# Patient Record
Sex: Male | Born: 1950 | Race: White | Hispanic: No | Marital: Married | Smoking: Former smoker
Health system: Southern US, Community
[De-identification: ages and names within clinical notes are randomized; demographics above are authoritative.]

## PROBLEM LIST (undated history)

## (undated) DIAGNOSIS — E785 Hyperlipidemia, unspecified: Secondary | ICD-10-CM

## (undated) DIAGNOSIS — M109 Gout, unspecified: Secondary | ICD-10-CM

## (undated) DIAGNOSIS — H53139 Sudden visual loss, unspecified eye: Secondary | ICD-10-CM

## (undated) DIAGNOSIS — I719 Aortic aneurysm of unspecified site, without rupture: Secondary | ICD-10-CM

## (undated) DIAGNOSIS — M7710 Lateral epicondylitis, unspecified elbow: Secondary | ICD-10-CM

## (undated) DIAGNOSIS — E039 Hypothyroidism, unspecified: Secondary | ICD-10-CM

## (undated) DIAGNOSIS — Z8719 Personal history of other diseases of the digestive system: Secondary | ICD-10-CM

## (undated) DIAGNOSIS — G459 Transient cerebral ischemic attack, unspecified: Secondary | ICD-10-CM

## (undated) DIAGNOSIS — B351 Tinea unguium: Secondary | ICD-10-CM

## (undated) DIAGNOSIS — G43909 Migraine, unspecified, not intractable, without status migrainosus: Secondary | ICD-10-CM

## (undated) DIAGNOSIS — Z86010 Personal history of colonic polyps: Secondary | ICD-10-CM

## (undated) DIAGNOSIS — N200 Calculus of kidney: Secondary | ICD-10-CM

## (undated) DIAGNOSIS — M25569 Pain in unspecified knee: Secondary | ICD-10-CM

## (undated) DIAGNOSIS — G4700 Insomnia, unspecified: Secondary | ICD-10-CM

## (undated) DIAGNOSIS — K648 Other hemorrhoids: Secondary | ICD-10-CM

## (undated) DIAGNOSIS — Q231 Congenital insufficiency of aortic valve: Secondary | ICD-10-CM

## (undated) HISTORY — DX: Aortic aneurysm of unspecified site, without rupture: I71.9

## (undated) HISTORY — DX: Calculus of kidney: N20.0

## (undated) HISTORY — PX: OTHER SURGICAL HISTORY: SHX169

## (undated) HISTORY — DX: Gout, unspecified: M10.9

## (undated) HISTORY — DX: Personal history of colonic polyps: Z86.010

## (undated) HISTORY — DX: Sudden visual loss, unspecified eye: H53.139

## (undated) HISTORY — DX: Lateral epicondylitis, unspecified elbow: M77.10

## (undated) HISTORY — DX: Tinea unguium: B35.1

## (undated) HISTORY — DX: Hyperlipidemia, unspecified: E78.5

## (undated) HISTORY — DX: Congenital insufficiency of aortic valve: Q23.1

## (undated) HISTORY — DX: Pain in unspecified knee: M25.569

## (undated) HISTORY — DX: Insomnia, unspecified: G47.00

## (undated) HISTORY — DX: Other hemorrhoids: K64.8

## (undated) HISTORY — PX: MYRINGOTOMY WITH TUBE PLACEMENT: SHX5663

## (undated) HISTORY — PX: CARDIAC VALVE REPLACEMENT: SHX585

## (undated) HISTORY — PX: CYSTECTOMY: SUR359

## (undated) HISTORY — DX: Migraine, unspecified, not intractable, without status migrainosus: G43.909

## (undated) HISTORY — DX: Transient cerebral ischemic attack, unspecified: G45.9

## (undated) HISTORY — PX: TONSILLECTOMY: SHX5217

## (undated) HISTORY — DX: Hypothyroidism, unspecified: E03.9

## (undated) HISTORY — DX: Personal history of other diseases of the digestive system: Z87.19

---

## 2001-10-08 ENCOUNTER — Emergency Department (HOSPITAL_COMMUNITY): Admission: EM | Admit: 2001-10-08 | Discharge: 2001-10-08 | Payer: Self-pay | Admitting: Emergency Medicine

## 2003-07-10 ENCOUNTER — Encounter: Payer: Self-pay | Admitting: Internal Medicine

## 2005-07-10 ENCOUNTER — Ambulatory Visit: Payer: Self-pay | Admitting: Internal Medicine

## 2005-07-17 ENCOUNTER — Encounter: Payer: Self-pay | Admitting: Internal Medicine

## 2005-07-17 ENCOUNTER — Ambulatory Visit: Payer: Self-pay

## 2008-08-26 ENCOUNTER — Ambulatory Visit: Payer: Self-pay | Admitting: Family Medicine

## 2008-08-26 DIAGNOSIS — E039 Hypothyroidism, unspecified: Secondary | ICD-10-CM

## 2008-08-26 HISTORY — DX: Hypothyroidism, unspecified: E03.9

## 2008-10-20 ENCOUNTER — Ambulatory Visit: Payer: Self-pay | Admitting: Family Medicine

## 2008-10-20 LAB — CONVERTED CEMR LAB
Bilirubin Urine: NEGATIVE
Glucose, Urine, Semiquant: NEGATIVE
Ketones, urine, test strip: NEGATIVE
Protein, U semiquant: NEGATIVE
Urobilinogen, UA: 0.2
pH: 5

## 2008-10-21 LAB — CONVERTED CEMR LAB
BUN: 15 mg/dL (ref 6–23)
Basophils Absolute: 0 10*3/uL (ref 0.0–0.1)
Bilirubin, Direct: 0.1 mg/dL (ref 0.0–0.3)
Cholesterol: 190 mg/dL (ref 0–200)
Creatinine, Ser: 1 mg/dL (ref 0.4–1.5)
Eosinophils Relative: 3.2 % (ref 0.0–5.0)
GFR calc non Af Amer: 81.52 mL/min (ref >60)
Glucose, Bld: 113 mg/dL — ABNORMAL HIGH (ref 70–99)
HCT: 45.9 % (ref 39.0–52.0)
LDL Cholesterol: 143 mg/dL — ABNORMAL HIGH (ref 0–99)
Lymphs Abs: 1.2 10*3/uL (ref 0.7–4.0)
MCV: 93.6 fL (ref 78.0–100.0)
Monocytes Absolute: 0.4 10*3/uL (ref 0.1–1.0)
Monocytes Relative: 10.4 % (ref 3.0–12.0)
Neutrophils Relative %: 56.5 % (ref 43.0–77.0)
PSA: 0.88 ng/mL (ref 0.10–4.00)
Platelets: 195 10*3/uL (ref 150.0–400.0)
Potassium: 4.8 meq/L (ref 3.5–5.1)
RDW: 12 % (ref 11.5–14.6)
TSH: 9.24 microintl units/mL — ABNORMAL HIGH (ref 0.35–5.50)
Total Bilirubin: 0.8 mg/dL (ref 0.3–1.2)
Triglycerides: 76 mg/dL (ref 0.0–149.0)
VLDL: 15.2 mg/dL (ref 0.0–40.0)
WBC: 4.3 10*3/uL — ABNORMAL LOW (ref 4.5–10.5)

## 2008-11-02 ENCOUNTER — Ambulatory Visit: Payer: Self-pay | Admitting: Family Medicine

## 2008-11-02 DIAGNOSIS — M25569 Pain in unspecified knee: Secondary | ICD-10-CM | POA: Insufficient documentation

## 2008-11-02 DIAGNOSIS — B351 Tinea unguium: Secondary | ICD-10-CM

## 2008-11-02 HISTORY — DX: Pain in unspecified knee: M25.569

## 2008-11-02 HISTORY — DX: Tinea unguium: B35.1

## 2008-12-11 ENCOUNTER — Telehealth: Payer: Self-pay | Admitting: Family Medicine

## 2008-12-22 ENCOUNTER — Telehealth: Payer: Self-pay | Admitting: Family Medicine

## 2008-12-25 ENCOUNTER — Ambulatory Visit: Payer: Self-pay | Admitting: Family Medicine

## 2008-12-25 DIAGNOSIS — Q231 Congenital insufficiency of aortic valve: Secondary | ICD-10-CM | POA: Insufficient documentation

## 2008-12-25 DIAGNOSIS — H53139 Sudden visual loss, unspecified eye: Secondary | ICD-10-CM

## 2008-12-25 HISTORY — DX: Sudden visual loss, unspecified eye: H53.139

## 2008-12-25 HISTORY — DX: Congenital insufficiency of aortic valve: Q23.1

## 2009-01-06 ENCOUNTER — Ambulatory Visit: Payer: Self-pay

## 2009-01-06 ENCOUNTER — Encounter: Payer: Self-pay | Admitting: Family Medicine

## 2009-01-22 ENCOUNTER — Ambulatory Visit: Payer: Self-pay | Admitting: Internal Medicine

## 2009-01-25 ENCOUNTER — Encounter: Payer: Self-pay | Admitting: Internal Medicine

## 2009-01-25 ENCOUNTER — Telehealth: Payer: Self-pay | Admitting: Internal Medicine

## 2009-01-25 DIAGNOSIS — G459 Transient cerebral ischemic attack, unspecified: Secondary | ICD-10-CM

## 2009-01-25 HISTORY — DX: Transient cerebral ischemic attack, unspecified: G45.9

## 2009-01-26 ENCOUNTER — Ambulatory Visit (HOSPITAL_COMMUNITY): Admission: RE | Admit: 2009-01-26 | Discharge: 2009-01-26 | Payer: Self-pay | Admitting: Internal Medicine

## 2009-01-26 ENCOUNTER — Ambulatory Visit: Payer: Self-pay | Admitting: Internal Medicine

## 2009-01-27 LAB — CONVERTED CEMR LAB
BUN: 16 mg/dL (ref 6–23)
Basophils Absolute: 0 10*3/uL (ref 0.0–0.1)
Basophils Relative: 0.5 % (ref 0.0–3.0)
CO2: 32 meq/L (ref 19–32)
Chloride: 107 meq/L (ref 96–112)
Creatinine, Ser: 1.1 mg/dL (ref 0.4–1.5)
Eosinophils Relative: 1.6 % (ref 0.0–5.0)
Glucose, Bld: 135 mg/dL — ABNORMAL HIGH (ref 70–99)
HCT: 44.2 % (ref 39.0–52.0)
Hemoglobin: 15.3 g/dL (ref 13.0–17.0)
INR: 1.1 — ABNORMAL HIGH (ref 0.8–1.0)
Lymphocytes Relative: 24.2 % (ref 12.0–46.0)
Lymphs Abs: 1.1 10*3/uL (ref 0.7–4.0)
Monocytes Relative: 7.8 % (ref 3.0–12.0)
Neutro Abs: 3 10*3/uL (ref 1.4–7.7)
RBC: 4.74 M/uL (ref 4.22–5.81)
WBC: 4.6 10*3/uL (ref 4.5–10.5)

## 2009-01-28 ENCOUNTER — Telehealth (INDEPENDENT_AMBULATORY_CARE_PROVIDER_SITE_OTHER): Payer: Self-pay | Admitting: *Deleted

## 2009-01-28 ENCOUNTER — Inpatient Hospital Stay (HOSPITAL_BASED_OUTPATIENT_CLINIC_OR_DEPARTMENT_OTHER): Admission: RE | Admit: 2009-01-28 | Discharge: 2009-01-28 | Payer: Self-pay | Admitting: Cardiovascular Disease

## 2009-01-28 ENCOUNTER — Ambulatory Visit: Payer: Self-pay | Admitting: Cardiovascular Disease

## 2009-02-02 ENCOUNTER — Ambulatory Visit: Payer: Self-pay | Admitting: Surgery

## 2009-02-03 ENCOUNTER — Telehealth: Payer: Self-pay | Admitting: Internal Medicine

## 2009-02-03 ENCOUNTER — Encounter: Admission: RE | Admit: 2009-02-03 | Discharge: 2009-02-03 | Payer: Self-pay | Admitting: Surgery

## 2009-02-04 ENCOUNTER — Encounter: Admission: RE | Admit: 2009-02-04 | Discharge: 2009-02-04 | Payer: Self-pay | Admitting: Surgery

## 2009-02-09 ENCOUNTER — Ambulatory Visit: Payer: Self-pay | Admitting: Surgery

## 2009-02-17 ENCOUNTER — Telehealth: Payer: Self-pay | Admitting: Internal Medicine

## 2009-02-17 ENCOUNTER — Telehealth: Payer: Self-pay | Admitting: Family Medicine

## 2009-02-23 ENCOUNTER — Ambulatory Visit (HOSPITAL_COMMUNITY): Admission: RE | Admit: 2009-02-23 | Discharge: 2009-02-23 | Payer: Self-pay | Admitting: Surgery

## 2009-02-23 ENCOUNTER — Encounter: Payer: Self-pay | Admitting: Surgery

## 2009-02-25 ENCOUNTER — Encounter: Payer: Self-pay | Admitting: Surgery

## 2009-02-25 ENCOUNTER — Ambulatory Visit: Payer: Self-pay | Admitting: Surgery

## 2009-02-25 ENCOUNTER — Inpatient Hospital Stay (HOSPITAL_COMMUNITY): Admission: RE | Admit: 2009-02-25 | Discharge: 2009-03-03 | Payer: Self-pay | Admitting: Surgery

## 2009-03-05 ENCOUNTER — Ambulatory Visit: Payer: Self-pay | Admitting: Internal Medicine

## 2009-03-05 LAB — CONVERTED CEMR LAB: POC INR: 1.8

## 2009-03-10 ENCOUNTER — Ambulatory Visit: Payer: Self-pay | Admitting: Cardiovascular Disease

## 2009-03-10 LAB — CONVERTED CEMR LAB: POC INR: 1.5

## 2009-03-12 ENCOUNTER — Encounter: Payer: Self-pay | Admitting: Internal Medicine

## 2009-03-17 ENCOUNTER — Ambulatory Visit: Payer: Self-pay | Admitting: Internal Medicine

## 2009-03-23 ENCOUNTER — Ambulatory Visit: Payer: Self-pay | Admitting: Surgery

## 2009-03-23 ENCOUNTER — Encounter: Admission: RE | Admit: 2009-03-23 | Discharge: 2009-03-23 | Payer: Self-pay | Admitting: Surgery

## 2009-03-24 ENCOUNTER — Ambulatory Visit: Payer: Self-pay | Admitting: Internal Medicine

## 2009-03-24 LAB — CONVERTED CEMR LAB: POC INR: 2.5

## 2009-03-26 ENCOUNTER — Emergency Department (HOSPITAL_COMMUNITY): Admission: EM | Admit: 2009-03-26 | Discharge: 2009-03-26 | Payer: Self-pay | Admitting: Emergency Medicine

## 2009-03-26 ENCOUNTER — Encounter (INDEPENDENT_AMBULATORY_CARE_PROVIDER_SITE_OTHER): Payer: Self-pay | Admitting: *Deleted

## 2009-03-27 ENCOUNTER — Telehealth (INDEPENDENT_AMBULATORY_CARE_PROVIDER_SITE_OTHER): Payer: Self-pay | Admitting: *Deleted

## 2009-03-29 ENCOUNTER — Encounter: Payer: Self-pay | Admitting: Family Medicine

## 2009-03-29 ENCOUNTER — Telehealth: Payer: Self-pay | Admitting: Family Medicine

## 2009-03-31 ENCOUNTER — Telehealth: Payer: Self-pay | Admitting: Internal Medicine

## 2009-04-01 ENCOUNTER — Ambulatory Visit: Payer: Self-pay | Admitting: Internal Medicine

## 2009-04-01 DIAGNOSIS — E785 Hyperlipidemia, unspecified: Secondary | ICD-10-CM

## 2009-04-01 HISTORY — DX: Hyperlipidemia, unspecified: E78.5

## 2009-04-05 ENCOUNTER — Encounter: Payer: Self-pay | Admitting: Cardiovascular Disease

## 2009-04-07 ENCOUNTER — Telehealth: Payer: Self-pay | Admitting: Internal Medicine

## 2009-04-14 ENCOUNTER — Ambulatory Visit: Payer: Self-pay | Admitting: Cardiology

## 2009-04-27 ENCOUNTER — Ambulatory Visit: Payer: Self-pay | Admitting: Family Medicine

## 2009-04-27 DIAGNOSIS — G4700 Insomnia, unspecified: Secondary | ICD-10-CM | POA: Insufficient documentation

## 2009-04-27 DIAGNOSIS — H10029 Other mucopurulent conjunctivitis, unspecified eye: Secondary | ICD-10-CM | POA: Insufficient documentation

## 2009-04-27 DIAGNOSIS — H109 Unspecified conjunctivitis: Secondary | ICD-10-CM | POA: Insufficient documentation

## 2009-04-27 HISTORY — DX: Insomnia, unspecified: G47.00

## 2009-04-28 ENCOUNTER — Ambulatory Visit: Payer: Self-pay | Admitting: Cardiovascular Disease

## 2009-05-12 ENCOUNTER — Ambulatory Visit: Payer: Self-pay | Admitting: Cardiology

## 2009-05-12 LAB — CONVERTED CEMR LAB: POC INR: 2.1

## 2009-06-02 ENCOUNTER — Ambulatory Visit: Payer: Self-pay | Admitting: Family Medicine

## 2009-06-02 DIAGNOSIS — M7710 Lateral epicondylitis, unspecified elbow: Secondary | ICD-10-CM

## 2009-06-02 HISTORY — DX: Lateral epicondylitis, unspecified elbow: M77.10

## 2009-06-03 ENCOUNTER — Ambulatory Visit: Payer: Self-pay | Admitting: Cardiology

## 2009-06-21 ENCOUNTER — Ambulatory Visit: Payer: Self-pay | Admitting: Internal Medicine

## 2009-06-25 ENCOUNTER — Telehealth: Payer: Self-pay | Admitting: Internal Medicine

## 2009-07-02 ENCOUNTER — Encounter: Payer: Self-pay | Admitting: Internal Medicine

## 2009-07-02 ENCOUNTER — Ambulatory Visit: Payer: Self-pay

## 2009-07-02 ENCOUNTER — Ambulatory Visit: Payer: Self-pay | Admitting: Cardiovascular Disease

## 2009-07-02 ENCOUNTER — Ambulatory Visit (HOSPITAL_COMMUNITY): Admission: RE | Admit: 2009-07-02 | Discharge: 2009-07-02 | Payer: Self-pay | Admitting: Internal Medicine

## 2009-07-02 LAB — CONVERTED CEMR LAB: POC INR: 2

## 2009-07-29 ENCOUNTER — Ambulatory Visit: Payer: Self-pay | Admitting: Cardiovascular Disease

## 2009-07-29 LAB — CONVERTED CEMR LAB: POC INR: 1.9

## 2009-08-26 ENCOUNTER — Ambulatory Visit: Payer: Self-pay | Admitting: Cardiology

## 2009-08-26 LAB — CONVERTED CEMR LAB: POC INR: 2

## 2009-09-23 ENCOUNTER — Ambulatory Visit: Payer: Self-pay | Admitting: Cardiology

## 2009-10-14 ENCOUNTER — Ambulatory Visit: Payer: Self-pay | Admitting: Family Medicine

## 2009-10-21 ENCOUNTER — Ambulatory Visit: Payer: Self-pay | Admitting: Internal Medicine

## 2009-10-21 LAB — CONVERTED CEMR LAB: POC INR: 2.8

## 2009-11-02 ENCOUNTER — Encounter (INDEPENDENT_AMBULATORY_CARE_PROVIDER_SITE_OTHER): Payer: Self-pay | Admitting: *Deleted

## 2009-11-18 ENCOUNTER — Ambulatory Visit: Payer: Self-pay | Admitting: Cardiovascular Disease

## 2009-12-09 ENCOUNTER — Telehealth: Payer: Self-pay | Admitting: Internal Medicine

## 2009-12-16 ENCOUNTER — Ambulatory Visit: Payer: Self-pay | Admitting: Internal Medicine

## 2009-12-20 ENCOUNTER — Ambulatory Visit: Payer: Self-pay | Admitting: Internal Medicine

## 2009-12-20 DIAGNOSIS — K648 Other hemorrhoids: Secondary | ICD-10-CM | POA: Insufficient documentation

## 2009-12-20 DIAGNOSIS — Z860100 Personal history of colon polyps, unspecified: Secondary | ICD-10-CM

## 2009-12-20 HISTORY — DX: Personal history of colonic polyps: Z86.010

## 2009-12-20 HISTORY — DX: Other hemorrhoids: K64.8

## 2009-12-20 HISTORY — DX: Personal history of colon polyps, unspecified: Z86.0100

## 2010-01-13 ENCOUNTER — Ambulatory Visit: Payer: Self-pay | Admitting: Internal Medicine

## 2010-02-10 ENCOUNTER — Ambulatory Visit: Payer: Self-pay | Admitting: Cardiology

## 2010-02-10 LAB — CONVERTED CEMR LAB: POC INR: 1.6

## 2010-03-03 ENCOUNTER — Ambulatory Visit: Payer: Self-pay | Admitting: Internal Medicine

## 2010-03-03 LAB — CONVERTED CEMR LAB: POC INR: 2.7

## 2010-03-31 ENCOUNTER — Telehealth: Payer: Self-pay | Admitting: Family Medicine

## 2010-05-12 ENCOUNTER — Ambulatory Visit: Payer: Self-pay | Admitting: Cardiology

## 2010-06-23 ENCOUNTER — Ambulatory Visit: Admit: 2010-06-23 | Payer: Self-pay

## 2010-06-29 ENCOUNTER — Telehealth: Payer: Self-pay | Admitting: Family Medicine

## 2010-06-30 ENCOUNTER — Ambulatory Visit
Admission: RE | Admit: 2010-06-30 | Discharge: 2010-06-30 | Payer: Self-pay | Source: Home / Self Care | Attending: Family Medicine | Admitting: Family Medicine

## 2010-06-30 ENCOUNTER — Other Ambulatory Visit: Payer: Self-pay | Admitting: Family Medicine

## 2010-06-30 LAB — TSH: TSH: 10.06 u[IU]/mL — ABNORMAL HIGH (ref 0.35–5.50)

## 2010-07-04 ENCOUNTER — Encounter: Payer: Self-pay | Admitting: Internal Medicine

## 2010-07-04 ENCOUNTER — Telehealth: Payer: Self-pay | Admitting: Internal Medicine

## 2010-07-12 NOTE — Medication Information (Signed)
Summary: rov couamdin - [facility]  Anticoagulant Therapy  Managed by: Bethena Midget, RN, BSN Referring MD: Tenny Craw MD, Gunnar Fusi PCP: [name] MD Supervising MD: Eden Emms MD, Theron Arista Indication 1: Aortic Valve Replacement (V34.3) Indication 2: Aortic Valve Replacement Valve Type: [facility] Judes -- Mechanical Lab Used: [facility] Site: [address] INR POC 2.0 INR RANGE 2-3  Dietary changes: yes       Details: Diet is not regular, it dose vary.   Health status changes: no    Bleeding/hemorrhagic complications: no    Recent/future hospitalizations: no    Any changes in medication regimen? no    Recent/future dental: no  Any missed doses?: no       Is patient compliant with meds? yes       Allergies: 1)  ! Penicillin V Potassium (Penicillin V Potassium) 2)  ! * Shellfish  Anticoagulation Management History:      The patient is taking warfarin and comes in today for a routine follow up visit.  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  Negative risk factors for bleeding include an age less than 37 years old.  The bleeding index is 'intermediate risk'.  Positive CHADS2 values include Prior Stroke/CVA/TIA.  Negative CHADS2 values include Age > 45 years old.  His last INR was 2.1.  Anticoagulation responsible provider: Eden Emms MD, Theron Arista.  INR POC: 2.0.  Cuvette Lot#: [id number].  Exp: 09/2010.    Anticoagulation Management Assessment/Plan:      The patient's current anticoagulation dose is Coumadin 5 mg tabs: Take as directed by coumadin clinic..  The target INR is 2.0-3.0.  The next INR is due 07/29/2009.  Anticoagulation instructions were given to patient.  Results were reviewed/authorized by Bethena Midget, RN, BSN.  He was notified by Bethena Midget, RN, BSN.         Prior Anticoagulation Instructions: INR 2.1  ok to increase greens Coumadin 1 and 1/2 tab = 7.5mg  each day  Current Anticoagulation Instructions: INR 2.0 Today take 10mg s then resume 7.5mg s daily. Recheck in  4 weeks.

## 2010-07-12 NOTE — Medication Information (Signed)
Summary: rov/ewj  Anticoagulant Therapy  Managed by: Weston Brass, PharmD Referring MD: Tenny Craw MD, Gunnar Fusi PCP: [name] MD Supervising MD: Jens Som MD, Arlys John Indication 1: Aortic Valve Replacement (V34.3) Indication 2: Aortic Valve Replacement Valve Type: [facility] Judes -- Mechanical Lab Used: [facility] Site: [facility] INR POC 2.0 INR RANGE 2-3  Dietary changes: no    Health status changes: no    Bleeding/hemorrhagic complications: no    Recent/future hospitalizations: no    Any changes in medication regimen? no    Recent/future dental: no  Any missed doses?: no       Is patient compliant with meds? yes       Allergies: 1)  ! Penicillin V Potassium (Penicillin V Potassium) 2)  ! * Shellfish  Anticoagulation Management History:      The patient is taking warfarin and comes in today for a routine follow up visit.  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  Negative risk factors for bleeding include an age less than 30 years old.  The bleeding index is 'intermediate risk'.  Positive CHADS2 values include Prior Stroke/CVA/TIA.  Negative CHADS2 values include Age > 11 years old.  His last INR was 2.1.  Anticoagulation responsible provider: Jens Som MD, Arlys John.  INR POC: 2.0.  Cuvette Lot#: [id number].  Exp: 09/2010.    Anticoagulation Management Assessment/Plan:      The patient's current anticoagulation dose is Coumadin 5 mg tabs: Take as directed by coumadin clinic..  The target INR is 2.0-3.0.  The next INR is due 10/21/2009.  Anticoagulation instructions were given to patient.  Results were reviewed/authorized by Weston Brass, PharmD.  He was notified by Weston Brass PharmD.         Prior Anticoagulation Instructions: INR 2.0  Continue on same dosage 1.5 tablets daily except 2 tablets on Mondays and Thursdays.  Recheck in 4 weeks.    Current Anticoagulation Instructions: INR 2.0  Increase dose to 1 1/2 tablets every day except 2 tablets on Sunday,  Tuesday and Thursday

## 2010-07-12 NOTE — Medication Information (Signed)
Summary: rov/sp  Anticoagulant Therapy  Managed by: Cloyde Reams, RN, BSN Referring MD: Tenny Craw MD, Gunnar Fusi PCP: [name] MD Supervising MD: Tenny Craw MD, Gunnar Fusi Indication 1: Aortic Valve Replacement (V34.3) Indication 2: Aortic Valve Replacement Valve Type: [facility] -- Mechanical Lab Used: [facility] Site: [facility] INR POC 2.6 INR RANGE 2-3  Dietary changes: no    Health status changes: no    Bleeding/hemorrhagic complications: no    Recent/future hospitalizations: no    Any changes in medication regimen? yes       Details: Started on Doxycycline 1 week ago, has 1 more week on.   Recent/future dental: no  Any missed doses?: no       Is patient compliant with meds? yes       Allergies: 1)  ! Penicillin V Potassium (Penicillin V Potassium) 2)  ! * Shellfish  Anticoagulation Management History:      The patient is taking warfarin and comes in today for a routine follow up visit.  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  Negative risk factors for bleeding include an age less than 42 years old.  The bleeding index is 'intermediate risk'.  Positive CHADS2 values include Prior Stroke/CVA/TIA.  Negative CHADS2 values include Age > 68 years old.  His last INR was 2.1.  Anticoagulation responsible provider: Tenny Craw MD, Gunnar Fusi.  INR POC: 2.6.  Cuvette Lot#: [id number].  Exp: 02/2011.    Anticoagulation Management Assessment/Plan:      The patient's current anticoagulation dose is Coumadin 5 mg tabs: Take as directed by coumadin clinic..  The target INR is 2.0-3.0.  The next INR is due 01/13/2010.  Anticoagulation instructions were given to patient.  Results were reviewed/authorized by Cloyde Reams, RN, BSN.  He was notified by Cloyde Reams RN.         Prior Anticoagulation Instructions: INR 2.1  Continue same dose of 1 1/2 tablets every day except 2 tablets on Sunday, Tuesday and Thursday.   Current Anticoagulation Instructions: INR 2.6  Continue on same  dosage 1.5 tablets daily except 2 tablets on Sundays, Tuesdays, and Thursdays.  Recheck in 4 weeks.

## 2010-07-12 NOTE — Progress Notes (Signed)
Summary: Question about medication  Phone Note Call from Patient Call back at Home Phone [phone]   Caller: Patient Summary of Call: Pt calling with question about medication Initial call taken by: Judie Grieve,  December 09, 2009 2:02 PM  Follow-up for Phone Call        Attempted TCB pt.  LMOM TCB with questions. Cloyde Reams RN  December 09, 2009 3:49 PM  Pt returning call about medication question Judie Grieve  December 10, 2009 11:26 AM    Additional Follow-up for Phone Call Additional follow up Details #1::        Pt on doxycycline x 14 days for skin infection.  INR on 6/9 was 2.1.  Has appt next week.  Instructed pt to continue current dose and keep his appt next week.  Additional Follow-up by: Weston Brass PharmD,  December 10, 2009 2:02 PM

## 2010-07-12 NOTE — Medication Information (Signed)
Summary: rov/tm  Anticoagulant Therapy  Managed by: Cloyde Reams, RN, BSN Referring MD: Tenny Craw MD, Gunnar Fusi PCP: [name] MD Supervising MD: Eden Emms MD, Theron Arista Indication 1: Aortic Valve Replacement (V34.3) Indication 2: Aortic Valve Replacement Valve Type: [facility] -- Mechanical Lab Used: [facility] Site: [facility] INR POC 1.9 INR RANGE 2-3  Dietary changes: yes       Details: More consistant with vit K intake  Health status changes: no    Bleeding/hemorrhagic complications: no    Recent/future hospitalizations: no    Any changes in medication regimen? no    Recent/future dental: no  Any missed doses?: no       Is patient compliant with meds? yes       Allergies (verified): 1)  ! Penicillin V Potassium (Penicillin V Potassium) 2)  ! * Shellfish  Anticoagulation Management History:      The patient is taking warfarin and comes in today for a routine follow up visit.  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  Negative risk factors for bleeding include an age less than 37 years old.  The bleeding index is 'intermediate risk'.  Positive CHADS2 values include Prior Stroke/CVA/TIA.  Negative CHADS2 values include Age > 11 years old.  His last INR was 2.1.  Anticoagulation responsible provider: Eden Emms MD, Theron Arista.  INR POC: 1.9.  Cuvette Lot#: [id number].  Exp: 09/2010.    Anticoagulation Management Assessment/Plan:      The patient's current anticoagulation dose is Coumadin 5 mg tabs: Take as directed by coumadin clinic..  The target INR is 2.0-3.0.  The next INR is due 08/26/2009.  Anticoagulation instructions were given to patient.  Results were reviewed/authorized by Cloyde Reams, RN, BSN.  He was notified by Cloyde Reams RN.         Prior Anticoagulation Instructions: INR 2.0 Today take 10mg s then resume 7.5mg s daily. Recheck in 4 weeks.   Current Anticoagulation Instructions: INR 1.9  Start taking 7.5mg  daily except 10mg  on Thursdays.   Recheck in 4 weeks.   Prescriptions: COUMADIN 5 MG TABS (WARFARIN SODIUM) Take as directed by coumadin clinic.  #150 x 3   Entered by:   Cloyde Reams RN   Authorized by:   Sherrill Raring, MD, [facility]   Signed by:   Cloyde Reams RN on 07/29/2009   Method used:   Electronically to        [facility]. [address] [phone]* (retail)       [address]       [place], [place]  [address]       Ph: [phone] or [phone]       Fax: [phone]   RxID:   [id number]

## 2010-07-12 NOTE — Medication Information (Signed)
Summary: rov/sp  Anticoagulant Therapy  Managed by: Weston Brass, PharmD Referring MD: Tenny Craw MD, Gunnar Fusi PCP: [name] MD Supervising MD: Eden Emms MD, Theron Arista Indication 1: Aortic Valve Replacement (V34.3) Indication 2: Aortic Valve Replacement Valve Type: [facility] -- Mechanical Lab Used: [facility] Site: [facility] INR POC 2.1 INR RANGE 2-3  Dietary changes: no    Health status changes: no    Bleeding/hemorrhagic complications: no    Recent/future hospitalizations: no    Any changes in medication regimen? no    Recent/future dental: no  Any missed doses?: no       Is patient compliant with meds? yes       Allergies: 1)  ! Penicillin V Potassium (Penicillin V Potassium) 2)  ! * Shellfish  Anticoagulation Management History:      The patient is taking warfarin and comes in today for a routine follow up visit.  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  Negative risk factors for bleeding include an age less than 57 years old.  The bleeding index is 'intermediate risk'.  Positive CHADS2 values include Prior Stroke/CVA/TIA.  Negative CHADS2 values include Age > 60 years old.  His last INR was 2.1.  Anticoagulation responsible provider: Eden Emms MD, Theron Arista.  INR POC: 2.1.  Cuvette Lot#: [id number].  Exp: 01/2011.    Anticoagulation Management Assessment/Plan:      The patient's current anticoagulation dose is Coumadin 5 mg tabs: Take as directed by coumadin clinic..  The target INR is 2.0-3.0.  The next INR is due 12/16/2009.  Anticoagulation instructions were given to patient.  Results were reviewed/authorized by Weston Brass, PharmD.  He was notified by Weston Brass PharmD.         Prior Anticoagulation Instructions: INR 2.8  Continue same dose of 1 1/2 tablets every day except 2 tablets on Sunday, Tuesday and Thursday   Current Anticoagulation Instructions: INR 2.1  Continue same dose of 1 1/2 tablets every day except 2 tablets on Sunday, Tuesday and  Thursday.

## 2010-07-12 NOTE — Medication Information (Signed)
Summary: rov/ewj  Anticoagulant Therapy  Managed by: Cloyde Reams, RN, BSN Referring MD: Tenny Craw MD, Gunnar Fusi PCP: [name] MD Supervising MD: Tenny Craw MD, Gunnar Fusi Indication 1: Aortic Valve Replacement (V34.3) Indication 2: Aortic Valve Replacement Valve Type: [facility]es -- Mechanical Lab Used: [facility] Site: [facility] INR POC 2.7 INR RANGE 2-3  Dietary changes: no    Health status changes: no    Bleeding/hemorrhagic complications: no    Recent/future hospitalizations: no    Any changes in medication regimen? yes       Details: Completed 30 day course of Doxycycline.    Recent/future dental: no  Any missed doses?: no       Is patient compliant with meds? yes       Allergies: 1)  ! Penicillin V Potassium (Penicillin V Potassium) 2)  ! * Shellfish  Anticoagulation Management History:      The patient is taking warfarin and comes in today for a routine follow up visit.  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  Negative risk factors for bleeding include an age less than 79 years old.  The bleeding index is 'intermediate risk'.  Positive CHADS2 values include Prior Stroke/CVA/TIA.  Negative CHADS2 values include Age > 32 years old.  His last INR was 2.1.  Anticoagulation responsible provider: Tenny Craw MD, Gunnar Fusi.  INR POC: 2.7.  Cuvette Lot#: [id number].  Exp: 03/2011.    Anticoagulation Management Assessment/Plan:      The patient's current anticoagulation dose is Coumadin 5 mg tabs: Take as directed by coumadin clinic..  The target INR is 2.0-3.0.  The next INR is due 02/10/2010.  Anticoagulation instructions were given to patient.  Results were reviewed/authorized by Cloyde Reams, RN, BSN.  He was notified by Cloyde Reams RN.         Prior Anticoagulation Instructions: INR 2.6  Continue on same dosage 1.5 tablets daily except 2 tablets on Sundays, Tuesdays, and Thursdays.  Recheck in 4 weeks.   Current Anticoagulation Instructions: INR 2.7  Continue  on same dosage 1.5 tablets daily except 2 tablets on Sundays, Tuesdays, and Thursdays.  Recheck in 4 weeks.

## 2010-07-12 NOTE — Procedures (Signed)
Summary: colonoscopy   Colonoscopy  Procedure date:  07/10/2003  Findings:      Pathology:  Hyperplastic polyp.     Location:  [facility].    Procedures Next Due Date:    Colonoscopy: 07/2008  Colonoscopy  Procedure date:  07/10/2003  Findings:      Pathology:  Hyperplastic polyp.     Location:  [facility].    Procedures Next Due Date:    Colonoscopy: 07/2008 Patient Name: Samuel [name], Samuel [name]. MRN:  Procedure Procedures: Colonoscopy CPT: [phone].    with polypectomy. CPT: [id number].  Personnel: Endoscopist: Wilhemina Bonito. Marina Goodell, MD.  Referred By: [name], MD.  Exam Location: Exam performed in Outpatient Clinic. Outpatient  Patient Consent: Procedure, Alternatives, Risks and Benefits discussed, consent obtained, from patient. Consent was obtained by the RN.  Indications Symptoms: Hematochezia.  Average Risk Screening Routine.  History  Current Medications: Patient is not currently taking Coumadin.  Pre-Exam Physical: Performed Jul 10, 2003. Cardio-pulmonary exam abnormal. Rectal exam, HEENT exam , Abdominal exam, Extremity exam, Neurological exam, Mental status exam WNL. Abnormal PE findings include: MUR MUR.  Exam Exam: Extent of exam reached: Cecum, extent intended: Cecum.  The cecum was identified by appendiceal orifice and IC valve. Patient position: on left side. Colon retroflexion performed. Images taken. ASA Classification: II. Tolerance: excellent.  Monitoring: Pulse and BP monitoring, Oximetry used. Supplemental O2 given.  Colon Prep Used MIRALAX for colon prep. Prep results: excellent.  Sedation Meds: Patient assessed and found to be appropriate for moderate (conscious) sedation. Fentanyl 75 mcg. given IV. Versed 7 mg. given IV.  Findings NORMAL EXAM: Cecum to Rectum. Comments: INTERNAL HEMORRHOIDS PRESENT.  POLYP: Descending Colon, Maximum size: 4 mm. sessile polyp. Procedure:  snare without cautery, removed, retrieved,  Polyp sent to pathology. ICD9: Colon Polyps: 211.3.   Assessment Abnormal examination, see findings above.  Diagnoses: 211.3: Colon Polyps.  455.0: Hemorrhoids, Internal.   Events  Unplanned Interventions: No intervention was required.  Unplanned Events: There were no complications. Plans Patient Education: Patient given standard instructions for: Polyps.  Disposition: After procedure patient sent to recovery. After recovery patient sent home.  Scheduling/Referral: Colonoscopy, to Wilhemina Bonito. Marina Goodell, MD, IN 5 YEARS,    This report was created from the original endoscopy report, which was reviewed and signed by the above listed endoscopist.   cc:  [name], MD      The Patient

## 2010-07-12 NOTE — Medication Information (Signed)
Summary: rov/ewj  Anticoagulant Therapy  Managed by: Cloyde Reams, RN, BSN Referring MD: Tenny Craw MD, Gunnar Fusi PCP: [name] MD Supervising MD: Jens Som MD, Arlys John Indication 1: Aortic Valve Replacement (V34.3) Indication 2: Aortic Valve Replacement Valve Type: [facility] Judes -- Mechanical Lab Used: [facility] Site: [facility] INR POC 1.6 INR RANGE 2-3  Dietary changes: no    Health status changes: no    Bleeding/hemorrhagic complications: no    Recent/future hospitalizations: no    Any changes in medication regimen? yes       Details: Continues on Doxycycline bid.  Recent/future dental: no  Any missed doses?: no       Is patient compliant with meds? yes       Allergies: 1)  ! Penicillin V Potassium (Penicillin V Potassium) 2)  ! * Shellfish  Anticoagulation Management History:      The patient is taking warfarin and comes in today for a routine follow up visit.  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  Negative risk factors for bleeding include an age less than 35 years old.  The bleeding index is 'intermediate risk'.  Positive CHADS2 values include Prior Stroke/CVA/TIA.  Negative CHADS2 values include Age > 78 years old.  His last INR was 2.1.  Anticoagulation responsible Samuel [name]: Jens Som MD, Arlys John.  INR POC: 1.6.  Cuvette Lot#: [id number].  Exp: 03/2011.    Anticoagulation Management Assessment/Plan:      The patient's current anticoagulation dose is Coumadin 5 mg tabs: Take as directed by coumadin clinic..  The target INR is 2.0-3.0.  The next INR is due 03/03/2010.  Anticoagulation instructions were given to patient.  Results were reviewed/authorized by Cloyde Reams, RN, BSN.  He was notified by Cloyde Reams RN.         Prior Anticoagulation Instructions: INR 2.7  Continue on same dosage 1.5 tablets daily except 2 tablets on Sundays, Tuesdays, and Thursdays.  Recheck in 4 weeks.    Current Anticoagulation Instructions: INR 1.6  Take 3  tablets today, Then resume same dosage 1.5 tablets daily except 2 tablets on Sundays, Tuesdays, and Thursdays.  Recheck in 2-3 weeks.   Prescriptions: COUMADIN 5 MG TABS (WARFARIN SODIUM) Take as directed by coumadin clinic.  #150 x 1   Entered by:   Erika Johnson RN   Authorized by:   Paula Virginia Ross, MD, FACC   Signed by:   Erika Johnson RN on 02/10/2010   Method used:   Electronically to        [facility] [address] #[id number]* (retail)       [address]       [place], [place]  [address]       Ph: [phone] or [phone]       Fax: [phone]   RxID:   [id number]

## 2010-07-12 NOTE — Medication Information (Signed)
Summary: rov/ewj  Anticoagulant Therapy  Managed by: Bethena Midget, RN, BSN Referring MD: Tenny Craw MD, Gunnar Fusi PCP: [name] MD Supervising MD: Gala Romney MD, Reuel Boom Indication 1: Aortic Valve Replacement (V34.3) Indication 2: Aortic Valve Replacement Valve Type: [facility] Judes -- Mechanical Lab Used: [facility] Site: [address] INR POC 2.7 INR RANGE 2-3  Dietary changes: no    Health status changes: no    Bleeding/hemorrhagic complications: no    Recent/future hospitalizations: no    Any changes in medication regimen? yes       Details: Still on Doxycycline  BID   Recent/future dental: no  Any missed doses?: no       Is patient compliant with meds? yes       Allergies: 1)  ! Penicillin V Potassium (Penicillin V Potassium) 2)  ! * Shellfish  Anticoagulation Management History:      The patient is taking warfarin and comes in today for a routine follow up visit.  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  Negative risk factors for bleeding include an age less than 30 years old.  The bleeding index is 'intermediate risk'.  Positive CHADS2 values include Prior Stroke/CVA/TIA.  Negative CHADS2 values include Age > 46 years old.  His last INR was 2.1.  Anticoagulation responsible provider: Bensimhon MD, Reuel Boom.  INR POC: 2.7.  Cuvette Lot#: [id number].  Exp: 04/2011.    Anticoagulation Management Assessment/Plan:      The patient's current anticoagulation dose is Coumadin 5 mg tabs: Take as directed by coumadin clinic..  The target INR is 2.0-3.0.  The next INR is due 03/31/2010.  Anticoagulation instructions were given to patient.  Results were reviewed/authorized by Bethena Midget, RN, BSN.  He was notified by Bethena Midget, RN, BSN.         Prior Anticoagulation Instructions: INR 1.6  Take 3 tablets today, Then resume same dosage 1.5 tablets daily except 2 tablets on Sundays, Tuesdays, and Thursdays.  Recheck in 2-3 weeks.    Current Anticoagulation  Instructions: INR 2.7 Continue 7.5mg s daily except 10mg s on Sundays, Tuesdays and Thursdays. Recheck in 4 weeks.

## 2010-07-12 NOTE — Medication Information (Signed)
Summary: rov/ewj  Anticoagulant Therapy  Managed by: Cloyde Reams, RN, BSN Referring MD: Tenny Craw MD, Gunnar Fusi PCP: [name] MD Supervising MD: Shirlee Latch MD, Deanna Boehlke Indication 1: Aortic Valve Replacement (V34.3) Indication 2: Aortic Valve Replacement Valve Type: [facility] Judes -- Mechanical Lab Used: [facility] Site: [facility] INR POC 2.0 INR RANGE 2-3  Dietary changes: no    Health status changes: no    Bleeding/hemorrhagic complications: no    Recent/future hospitalizations: no    Any changes in medication regimen? no    Recent/future dental: no  Any missed doses?: yes     Details: Missed Tuesdays dose delayed for a few hours, but made it up.    Is patient compliant with meds? yes      Comments: Pt states he has been taking 7.5mg  daily except 10mg  on M,Th  Allergies: 1)  ! Penicillin V Potassium (Penicillin V Potassium) 2)  ! * Shellfish  Anticoagulation Management History:      The patient is taking warfarin and comes in today for a routine follow up visit.  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  Negative risk factors for bleeding include an age less than 23 years old.  The bleeding index is 'intermediate risk'.  Positive CHADS2 values include Prior Stroke/CVA/TIA.  Negative CHADS2 values include Age > 80 years old.  His last INR was 2.1.  Anticoagulation responsible provider: Shirlee Latch MD, Adir Schicker.  INR POC: 2.0.  Cuvette Lot#: [id number].  Exp: 09/2010.    Anticoagulation Management Assessment/Plan:      The patient's current anticoagulation dose is Coumadin 5 mg tabs: Take as directed by coumadin clinic..  The target INR is 2.0-3.0.  The next INR is due 09/23/2009.  Anticoagulation instructions were given to patient.  Results were reviewed/authorized by Cloyde Reams, RN, BSN.  He was notified by Cloyde Reams RN.         Prior Anticoagulation Instructions: INR 1.9  Start taking 7.5mg  daily except 10mg  on Thursdays.  Recheck in 4 weeks.    Current  Anticoagulation Instructions: INR 2.0  Continue on same dosage 1.5 tablets daily except 2 tablets on Mondays and Thursdays.  Recheck in 4 weeks.

## 2010-07-12 NOTE — Medication Information (Signed)
Summary: rov/sp  Anticoagulant Therapy  Managed by: Weston Brass, PharmD Referring MD: Tenny Craw MD, Gunnar Fusi PCP: [name] MD Supervising MD: Gala Romney MD, Reuel Boom Indication 1: Aortic Valve Replacement (V34.3) Indication 2: Aortic Valve Replacement Valve Type: [facility] Judes -- Mechanical Lab Used: [facility] Site: [facility] INR POC 2.8 INR RANGE 2-3  Dietary changes: no    Health status changes: no    Bleeding/hemorrhagic complications: no    Recent/future hospitalizations: no    Any changes in medication regimen? no    Recent/future dental: no  Any missed doses?: no       Is patient compliant with meds? yes       Allergies: 1)  ! Penicillin V Potassium (Penicillin V Potassium) 2)  ! * Shellfish  Anticoagulation Management History:      The patient is taking warfarin and comes in today for a routine follow up visit.  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  Negative risk factors for bleeding include an age less than 59 years old.  The bleeding index is 'intermediate risk'.  Positive CHADS2 values include Prior Stroke/CVA/TIA.  Negative CHADS2 values include Age > 1 years old.  His last INR was 2.1.  Anticoagulation responsible provider: Bensimhon MD, Reuel Boom.  INR POC: 2.8.  Cuvette Lot#: [id number].  Exp: 01/2011.    Anticoagulation Management Assessment/Plan:      The patient's current anticoagulation dose is Coumadin 5 mg tabs: Take as directed by coumadin clinic..  The target INR is 2.0-3.0.  The next INR is due 11/18/2009.  Anticoagulation instructions were given to patient.  Results were reviewed/authorized by Weston Brass, PharmD.  He was notified by Weston Brass PharmD.         Prior Anticoagulation Instructions: INR 2.0  Increase dose to 1 1/2 tablets every day except 2 tablets on Sunday, Tuesday and Thursday   Current Anticoagulation Instructions: INR 2.8  Continue same dose of 1 1/2 tablets every day except 2 tablets on Sunday, Tuesday and  Thursday

## 2010-07-12 NOTE — Assessment & Plan Note (Signed)
Summary: elbow pain/njr pt rsc appt time/njr   Vital Signs:  Patient profile:   60 year old male Temp:     98.6 degrees F oral BP sitting:   120 / 70  (left arm) Cuff size:   large  Vitals Entered By: Sid Falcon LPN (Oct 15, 2950 1:32 PM) CC: left elbow pain, acne    History of Present Illness: Patient here for the following.  Persistent left elbow pain. Diagnosis tendinitis several months ago. Has not been consistent with icing. No injury. Pain with gripping and lifting. No visible swelling.  History serous otitis left ear. Tympanostomy tube placed last year. Had some recent clear drainage. No hearing change.  Some diffuse alopecia. Has hypothyroidism treated with Levoxyl. He thinks this is mostly related to aging and genetic factors.  Allergies: 1)  ! Penicillin V Potassium (Penicillin V Potassium) 2)  ! * Shellfish  Past History:  Past Medical History: Last updated: 04/01/2009 Hypothyroidism Heart Murmur (bicuspid aortic valve) Migraines Aortic aneurysm TIA Dyslipidemia Kidney stones. Hypothyroidism  Review of Systems      See HPI  Physical Exam  General:  Well-developed,well-nourished,in no acute distress; alert,appropriate and cooperative throughout examination Head:  diffuse alopecia. Ears:  T-tube left eardrum. Appears to be draining-clear, nonpurulent. Moderate cerumen surrounding this. Right canal and eardrum are normal Mouth:  Oral mucosa and oropharynx without lesions or exudates.  Teeth in good repair. Neck:  No deformities, masses, or tenderness noted. Lungs:  Normal respiratory effort, chest expands symmetrically. Lungs are clear to auscultation, no crackles or wheezes. Heart:  normal rate and regular rhythm.  click from artificial aortic valve Extremities:  tender left lateral epicondyles. Full range of motion. No erythema warmth. Neurologic:  full strength upper extremities   Impression & Recommendations:  Problem # 1:  LATERAL  EPICONDYLITIS (ICD-726.32)  discussed risk and benefits of corticosteroid injection since he has failed conservative therapy. He is aware of increased risk of bruising and bleeding including hematoma risk with Coumadin therapy. He consents to proceed.  prepped skin with betadine and with 25 gauge 5/8 needle injected 40 mg depomedrol and 1 cc plain xyloc without difficulty-no visible bruising or swelling.   Orders: Injection, Tendon / Ligament (84132) Depo- Medrol 40mg  (J1030)  Problem # 2:  OTITIS MEDIA, SEROUS (ICD-381.4) T tube in place and appears to be draining.  Complete Medication List: 1)  Levoxyl 100 Mcg Tabs (Levothyroxine sodium) .... One by mouth once daily 2)  Coumadin 5 Mg Tabs (Warfarin sodium) .... Take as directed by coumadin clinic. 3)  Temazepam 15 Mg Caps (Temazepam) .... One by mouth at bedtime as needed insomnia 4)  Tylenol 325 Mg Tabs (Acetaminophen) .... As needed  Patient Instructions: 1)  continue icing the left elbow several times daily 2)  Consider tennis elbow strap.

## 2010-07-12 NOTE — Progress Notes (Signed)
Summary: REFILL REQUEST Temazepam  Phone Note Refill Request Message from:  Patient on March 31, 2010 11:22 AM  Refills Requested: Medication #1:  TEMAZEPAM 15 MG CAPS one by mouth at bedtime as needed insomnia   Notes: Therapist, occupational Pharmacy - [address] [facility].    Initial call taken by: Debbra Riding,  March 31, 2010 11:22 AM    Prescriptions: TEMAZEPAM 15 MG CAPS (TEMAZEPAM) one by mouth at bedtime as needed insomnia  #30 x 1   Entered by:   Sid Falcon LPN   Authorized by:   [name] MD   Signed by:   Sid Falcon LPN on 36/64/4034   Method used:   Telephoned to ...       [facility] [address] [phone]* (retail)       [address]       [place], [place]  [address]       Ph: [phone] or [phone]       Fax: [phone]   RxID:   [id number]

## 2010-07-12 NOTE — Medication Information (Signed)
Summary: rov/sp  Anticoagulant Therapy  Managed by: Bethena Midget, RN, BSN Referring MD: Tenny Craw MD, Gunnar Fusi PCP: [name] MD Supervising MD: Jens Som MD, Arlys John Indication 1: Aortic Valve Replacement (V34.3) Indication 2: Aortic Valve Replacement Valve Type: [facility] Judes -- Mechanical Lab Used: [facility] Site: [address] INR POC 2.3 INR RANGE 2-3  Dietary changes: no    Health status changes: no    Bleeding/hemorrhagic complications: no    Recent/future hospitalizations: no    Any changes in medication regimen? no       Details: Doxycycline BID   Recent/future dental: no  Any missed doses?: no       Is patient compliant with meds? yes       Allergies: 1)  ! Penicillin V Potassium (Penicillin V Potassium) 2)  ! * Shellfish  Anticoagulation Management History:      The patient is taking warfarin and comes in today for a routine follow up visit.  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  Negative risk factors for bleeding include an age less than 83 years old.  The bleeding index is 'intermediate risk'.  Positive CHADS2 values include Prior Stroke/CVA/TIA.  Negative CHADS2 values include Age > 31 years old.  His last INR was 2.1.  Anticoagulation responsible provider: Jens Som MD, Arlys John.  INR POC: 2.3.  Cuvette Lot#: [id number].  Exp: 05/2211.    Anticoagulation Management Assessment/Plan:      The patient's current anticoagulation dose is Coumadin 5 mg tabs: Take as directed by coumadin clinic..  The target INR is 2.0-3.0.  The next INR is due 06/16/2010.  Anticoagulation instructions were given to patient.  Results were reviewed/authorized by Bethena Midget, RN, BSN.  He was notified by Bethena Midget, RN, BSN.         Prior Anticoagulation Instructions: INR 2.7 Continue 7.5mg s daily except 10mg s on Sundays, Tuesdays and Thursdays. Recheck in 4 weeks.   Current Anticoagulation Instructions: INR 2.3 Continue 7.5mg s everyday except 10mg s on Tuesdays,  Thursdays, and Sundays. Recheck in 4 weeks.

## 2010-07-12 NOTE — Assessment & Plan Note (Signed)
Summary: discuss colonoscopy pt on coumadin/lk   History of Present Illness Visit Type: Initial Visit Primary GI MD: Samuel Flemings MD Primary Provider: [name] MD Chief Complaint: Patient is here to discuss colonoscopy due to being on coumadin. He does mention rare BRB on tissue he relates to his hemorrhoids.  History of Present Illness:   60 year old white male with a history of aortic stenosis status post St. Jude aortic valve replacement and aortic arch repair, chronic systemic anticoagulation the form of Coumadin, hypothyroidism, dyslipidemia, kidney stones, and prior TIA. He presents today regarding the need for followup colonoscopy. His initial screening colonoscopy was performed in January 2005. The preparation was excellent. The examination was complete to the cecum. The diminutive left-sided polyp was removed and found to be hyperplastic. Internal hemorrhoids noted. A recall letter was generated and sent in January 2010. However, progressive aortic stenosis led to open heart surgery October 2010. He has recovered. Since his index colonoscopy, he denies a family history of colon cancer developing. He has no lower GI complaints. He does mention trivial amounts of blood on the tissue only with the passage of a hard bowel movement.   GI Review of Systems      Denies abdominal pain, acid reflux, belching, bloating, chest pain, dysphagia with liquids, dysphagia with solids, heartburn, loss of appetite, nausea, vomiting, vomiting blood, weight loss, and  weight gain.      Reports hemorrhoids and  rectal bleeding.     Denies anal fissure, black tarry stools, change in bowel habit, constipation, diarrhea, diverticulosis, fecal incontinence, heme positive stool, irritable bowel syndrome, jaundice, light color stool, liver problems, and  rectal pain. Preventive Screening-Counseling & Management      Drug Use:  no.      Current Medications (verified): 1)  Levoxyl 100 Mcg Tabs  (Levothyroxine Sodium) .... One By Mouth Once Daily 2)  Coumadin 5 Mg Tabs (Warfarin Sodium) .... Take As Directed By Coumadin Clinic. 3)  Temazepam 15 Mg Caps (Temazepam) .... One By Mouth At Bedtime As Needed Insomnia 4)  Tylenol 325 Mg Tabs (Acetaminophen) .... As Needed 5)  Doxycycline Hyclate 100 Mg Caps (Doxycycline Hyclate) .... Take One By Mouth Two Times A Day  Allergies (verified): 1)  ! Penicillin V Potassium (Penicillin V Potassium) 2)  ! * Shellfish  Past History:  Past Medical History: Reviewed history from 12/16/2009 and no changes required. Hypothyroidism Heart Murmur (bicuspid aortic valve) Migraines Aortic aneurysm TIA Dyslipidemia Kidney stones. 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  The patient denies anemia, blood in urine, breast changes/lumps, confusion, cough, coughing up blood, depression-new, fainting, fatigue, fever, heart murmur, heart rhythm changes, itching, muscle pains/cramps, night sweats, nosebleeds, shortness of breath, skin rash, sleeping problems, sore throat, swelling of feet/legs, swollen lymph glands, thirst - excessive, urination - excessive, urination changes/pain, urine leakage, vision changes, and voice change.    Vital Signs:  Patient profile:   60 year old male Height:  72 inches Weight:      238.8 pounds BMI:     32.50 Pulse rate:   64 / minute Pulse rhythm:    regular BP sitting:   118 / 78  (left arm) Cuff size:   regular  Vitals Entered By: [name] CMA [name]) (December 20, 2009 9:40 AM)  Physical Exam  General:  Well developed, well nourished, no acute distress. Head:  Normocephalic and atraumatic. Eyes:  PERRLA, no icterus. Mouth:  No deformity or lesions, dentition normal. Lungs:  Clear throughout to auscultation. Heart:  Regular rate and rhythm; no murmurs, rubs,  or bruits.mechanical heart sounds Abdomen:  Soft, nontender and nondistended. No masses, hepatosplenomegaly or hernias noted. Normal bowel sounds. Pulses:  Normal pulses noted. Extremities:  no edema Neurologic:  alert and oriented Skin:  no jaundice Psych:  Alert and cooperative. Normal mood and affect.   Impression & Recommendations:  Problem # 1:  PERSONAL HX COLONIC POLYPS (ICD-V88.33) 60 year old with diminutive left-sided hyperplastic colon polyp in January 2005. We discussed today the current guidelines for interval colonoscopy. He is a baseline risk patient. No active or relevant GI symptoms at this time. It would be appropriate for his followup exam to be around January 2015. He understands the rationale behind the adjusted time interval and is delighted. He does understand that if relative signs or symptoms were to develop in the interim, he may require interval colonoscopy. At the time of his next exam, we will need to discuss the issues regarding management of his anticoagulation therapy.  Problem # 2:  HEMORRHOIDS-INTERNAL (ICD-455.0) trace minor intermittent bleeding with hard bowel movement but certainly due to known hemorrhoids. Given the trivial nature, nothing further to be done at present.  Patient Instructions: 1)  Will change your recall Colonoscopy to January of 2015. 2)  Please schedule a follow-up appointment as needed.  3)  The medication list was reviewed and reconciled.  All changed / newly prescribed medications were explained.  A complete  medication list was provided to the patient / caregiver.

## 2010-07-12 NOTE — Assessment & Plan Note (Signed)
Summary: PER CHECK OUT/SF   Visit Type:  Follow-up Primary Provider:  [name] MD  CC:  no new complaints pt states he is doing about the same but its not his heart.  History of Present Illness: Patient is a 60 year old with a history of aortic stenosis (s/p replacement this past fall (with mechanical vlave and aortic root replacemnt.  He was last in clinic just after surgery. Since seen, he has increased his actiivities.  He now wants to begin running some. He denies shortness of breath.  He has rare chest pains over his left chest that are transient, not associated with activity.  He has had them since surgery. He still has transient loss of vision in his R lower eye  Current Medications (verified): 1)  Levoxyl 100 Mcg Tabs (Levothyroxine Sodium) .... One By Mouth Once Daily 2)  Coumadin 5 Mg Tabs (Warfarin Sodium) .... Take As Directed By Coumadin Clinic. 3)  Temazepam 15 Mg Caps (Temazepam) .... One By Mouth At Bedtime As Needed Insomnia 4)  Tylenol 325 Mg Tabs (Acetaminophen) .... As Needed  Allergies (verified): 1)  ! Penicillin V Potassium (Penicillin V Potassium) 2)  ! * Shellfish  Past History:  Past Medical History: Last updated: 04/01/2009 Hypothyroidism Heart Murmur (bicuspid aortic valve) Migraines Aortic aneurysm TIA Dyslipidemia Kidney stones. 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 Negatvie to the above problem except as noted above.  Vital Signs:  Patient profile:   60 year old male Height:      72 inches Weight:      237 pounds BMI:     32.26 Pulse rate:   64 / minute BP sitting:   131 / 88  (left arm) Cuff size:   large  Vitals  Entered By: Samuel Kanaris, CNA (June 21, 2009 1:59 PM)  Physical Exam  Additional Exam:  HEENT:  Normocephalic, atraumatic. EOMI, PERRLA.  Neck: JVP is normal. No thyromegaly. No bruits.  Lungs: clear to auscultation. No rales no wheezes.  Heart: Regular rate and rhythm. Crisp valve sounds.  Gr I/VI systolic ejection murmur.PMI not displaced.  Abdomen:  Supple, nontender. Normal bowel sounds. No masses. No hepatomegaly.  Extremities:   Good distal pulses throughout. No lower extremity edema.  Musculoskeletal :moving all extremities.  Neuro:   alert and oriented x3.  Visual fields intact in both eys.   Impression & Recommendations:  Problem # 1:  BICUSPID AORTIC VALVE (ICD-746.4) s/p AVR.  Doing well.  Valve sounds are good.   Recommend and echo for baseline characterisitcs of valve. Patient would like to monitor coumadin himself.  I will discuss with coumadin clinic. His updated medication list for this problem includes:    Coumadin 5 Mg Tabs (Warfarin sodium) .Marland Kitchen... Take as directed by coumadin clinic.  Orders: Echocardiogram (Echo)  Problem # 2:  HYPERLIPIDEMIA-MIXED (ICD-272.4) Will need to be followed.  Problem # 3:  TRANSIENT ISCHEMIC ATTACK (ICD-435.9) I cannot explain the patients visual complaints.  On exam, visual fields are intact.  Will [place] Dr. Linnell [name]  He has a history of migraines but thes spells of vision loss are not associated with them.  Patient Instructions: 1)  Your physician recommends  that you schedule a follow-up appointment in: 10 months. The office will mail you a reminder 2 months prior appointmen date. 2)  Your physician has requested that you have an echocardiogram.  Echocardiography is a painless test that uses sound waves to create images of your heart. It provides your doctor with information about the size and shape of your heart and how well your heart's chambers and valves are working.  This procedure  takes approximately one hour. There are no restrictions for this procedure.  Patient will call to make the appointment.  Appended Document: PER CHECK OUT/SF Spoke with Samuel [name].  She will call patient and schedule visual fields for patient.

## 2010-07-12 NOTE — Progress Notes (Signed)
Summary: wishes to consider PT/INR home monitoring.  Phone Note Outgoing Call   Call placed by: Shelby Dubin PharmD, BCPS, CPP,  June 25, 2009 11:46 AM Call placed to: Patient Summary of Call: Patient wishes to be considered for home monitoring.  He had valve replacement on 02/25/2009.   Initial call taken by: Shelby Dubin PharmD, BCPS, CPP,  June 25, 2009 11:51 AM  Follow-up for Phone Call        D/W pt at 1151 am.  He is aware that we will consider this at 6 months therapy window.  Flag notifying Dr. Tenny Craw sent in addition.   Follow-up by: Shelby Dubin PharmD, BCPS, CPP,  June 25, 2009 11:52 AM

## 2010-07-12 NOTE — Letter (Signed)
Summary: New Patient letter  [facility]  [address] [place], [place] [address]   Phone: [phone]  Fax: [phone]       11/02/2009 MRN: [id number]  Samuel [name] [address] [place], [place]  [address]  Dear Samuel [name],  Welcome to the Gastroenterology Division at [facility]    You are scheduled to see Dr. Marina Goodell on 12/20/2009 at  9:30am  on the 3rd floor at [facility], [address]  We ask that you try to arrive at our office 15 minutes prior to your appointment time to allow for check-in.  We would like you to complete the enclosed self-administered evaluation form prior to your visit and bring it with you on the day of your appointment.  We will review it with you.  Also, please bring a complete list of all your medications or, if you prefer, bring the medication bottles and we will list them.  Please bring your insurance card so that we may make a copy of it.  If your insurance requires a referral to see a specialist, please bring your referral form from your primary care physician.  Co-payments are due at the time of your visit and may be paid by cash, check or credit card.     Your office visit will consist of a consult with your physician (includes a physical exam), any laboratory testing he/she may order, scheduling of any necessary diagnostic testing (e.g. x-ray, ultrasound, CT-scan), and scheduling of a procedure (e.g. Endoscopy, Colonoscopy) if required.  Please allow enough time on your schedule to allow for any/all of these possibilities.    If you cannot keep your appointment, please call [phone] to cancel or reschedule prior to your appointment date.  This allows [place] the opportunity to schedule an appointment for another patient in need of care.  If you do not cancel or reschedule by 5 p.m. the business day prior to your appointment date, you will be charged a $50.00 late cancellation/no-show fee.    Thank you for choosing   Gastroenterology for your medical needs.  We appreciate the opportunity to care for you.  Please visit [place] at our website  to learn more about our practice.                     Sincerely,                                                             The Gastroenterology Division

## 2010-07-13 DIAGNOSIS — Z7901 Long term (current) use of anticoagulants: Secondary | ICD-10-CM

## 2010-07-13 DIAGNOSIS — Q231 Congenital insufficiency of aortic valve: Secondary | ICD-10-CM

## 2010-07-13 DIAGNOSIS — G459 Transient cerebral ischemic attack, unspecified: Secondary | ICD-10-CM

## 2010-07-14 NOTE — Progress Notes (Signed)
Summary: Pt has tsh checked by [facility]. Pt req 90day supply Levothyroxine  Phone Note Call from Patient Call back at Home Phone [phone]   Caller: Patient Summary of Call: Pt called and said that he had his thyroid check by [facility] approx 6 months ago and results were fine. Pt can not afford ov just to discuss med. Pt req refill of Levothyroxine 90 day supply. Also pt did not req a refill for the Temazepam. Pt has enough of those.   Pls call Levothyroxine in to [facility] on [address]  Initial call taken by: Lucy Antigua,  June 29, 2010 2:13 PM  Follow-up for Phone Call        per chart records, last one in EMR per Dr Tenny Craw was 04/01/09 which makes it over one year ago.  We must monitor thyroid once per year.  Over or under replacing can be detrimental to his heart and overall health.  I am agreeable to have him come in for lab only and refill if OK level.  We cannot continue to prescribe meds that require monitoring without doing so. Follow-up by: [name] MD,  June 29, 2010 5:25 PM  Additional Follow-up for Phone Call Additional follow up Details #1::        Pt coming in this afternoon to get thyroid lvl recheck. Pt says that his is completely out of med now and needs to know if he could get samples for a few days. Pls advise.  Additional Follow-up by: Lucy Antigua,  June 30, 2010 9:56 AM    Additional Follow-up for Phone Call Additional follow up Details #2::    Can given Synthroid 100 micrograms samples if we have any. Will do 90 day after lab back. Follow-up by: [name] MD,  June 30, 2010 5:09 PM

## 2010-08-03 ENCOUNTER — Encounter (INDEPENDENT_AMBULATORY_CARE_PROVIDER_SITE_OTHER): Payer: Self-pay

## 2010-08-03 ENCOUNTER — Encounter: Payer: Self-pay | Admitting: Cardiology

## 2010-08-03 DIAGNOSIS — I359 Nonrheumatic aortic valve disorder, unspecified: Secondary | ICD-10-CM

## 2010-08-03 DIAGNOSIS — Z7901 Long term (current) use of anticoagulants: Secondary | ICD-10-CM

## 2010-08-03 DIAGNOSIS — Z954 Presence of other heart-valve replacement: Secondary | ICD-10-CM

## 2010-08-03 LAB — CONVERTED CEMR LAB: POC INR: 2.5

## 2010-08-09 NOTE — Medication Information (Signed)
Summary: rov/sp  Anticoagulant Therapy  Managed by: Weston Brass, PharmD Referring MD: Tenny Craw MD, Gunnar Fusi PCP: [name] MD Supervising MD: Shirlee Latch MD, Vaani Morren Indication 1: Aortic Valve Replacement (V34.3) Indication 2: Aortic Valve Replacement Valve Type: St Judes -- Mechanical Lab Used: [facility] Site: [facility] INR POC 2.5 INR RANGE 2-3  Dietary changes: no    Health status changes: no    Bleeding/hemorrhagic complications: no    Recent/future hospitalizations: no    Any changes in medication regimen? no    Recent/future dental: no  Any missed doses?: no       Is patient compliant with meds? yes       Allergies: 1)  ! Penicillin V Potassium (Penicillin V Potassium) 2)  ! * Shellfish  Anticoagulation Management History:      The patient is taking warfarin and comes in today for a routine follow up visit.  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  Negative risk factors for bleeding include an age less than 65 years old.  The bleeding index is 'intermediate risk'.  Positive CHADS2 values include Prior Stroke/CVA/TIA.  Negative CHADS2 values include Age > 35 years old.  His last INR was 2.1.  Anticoagulation responsible provider: Shirlee Latch MD, Caidyn Henricksen.  INR POC: 2.5.  Cuvette Lot#: [id number].  Exp: 06/2011.    Anticoagulation Management Assessment/Plan:      The patient's current anticoagulation dose is Coumadin 5 mg tabs: Take as directed by coumadin clinic..  The target INR is 2.0-3.0.  The next INR is due 09/14/2010.  Anticoagulation instructions were given to patient.  Results were reviewed/authorized by Weston Brass, PharmD.  He was notified by Weston Brass PharmD.         Prior Anticoagulation Instructions: INR 2.3 Continue 7.5mg s everyday except 10mg s on Tuesdays, Thursdays, and Sundays. Recheck in 4 weeks.   Current Anticoagulation Instructions: INR 2.5  Continue same dose of 1 1/2 tablets every day except 2 tablets on Sunday, Thursday,  Thursday.  Recheck in 6 weeks.

## 2010-08-09 NOTE — Progress Notes (Signed)
Summary: Delinquent Coumadin Pt  Phone Note Outgoing Call   Call placed by: Cloyde Reams RN,  July 04, 2010 11:32 AM Call placed to: Patient Summary of Call: Called pt for follow-up on delinquent Coumadin visit.  Last Coumadin check was 05/12/10, pt was due for f/u on 06/16/10. Pt called and cancelled appt due to financial reasons and did not reschedule.  Pt states he cannot afford to come into Clinic for Coumadin checkes every 4 weeks, he will need to stretch the time between visits to 2 months.  Advised pt per our protocol we are to check pt's Coumadin every 4 weeks, occassionally exceptions are made for individual pt's by their MD to extend the time between visits for very stable pt's to 6 weeks max.  Please advise if pt can go 6 weeks between Coumadin Checks. Initial call taken by: Cloyde Reams RN,  July 04, 2010 11:36 AM  Follow-up for Phone Call        OK.  He is aware of increased risk but will try for now. Follow-up by: Sherrill Raring, MD, [facility],  August 01, 2010 10:03 AM

## 2010-09-15 ENCOUNTER — Encounter: Payer: Self-pay | Admitting: *Deleted

## 2010-09-15 LAB — POCT I-STAT, CHEM 8
Calcium, Ion: 1.07 mmol/L — ABNORMAL LOW (ref 1.12–1.32)
HCT: 36 % — ABNORMAL LOW (ref 39.0–52.0)
Hemoglobin: 12.2 g/dL — ABNORMAL LOW (ref 13.0–17.0)
TCO2: 21 mmol/L (ref 0–100)

## 2010-09-15 LAB — URINALYSIS, ROUTINE W REFLEX MICROSCOPIC
Ketones, ur: 15 mg/dL — AB
Leukocytes, UA: NEGATIVE
Nitrite: NEGATIVE
Protein, ur: NEGATIVE mg/dL

## 2010-09-15 LAB — SAMPLE TO BLOOD BANK

## 2010-09-15 LAB — PROTIME-INR: Prothrombin Time: 27.7 seconds — ABNORMAL HIGH (ref 11.6–15.2)

## 2010-09-16 LAB — CBC
HCT: 25.4 % — ABNORMAL LOW (ref 39.0–52.0)
HCT: 27.5 % — ABNORMAL LOW (ref 39.0–52.0)
HCT: 33.3 % — ABNORMAL LOW (ref 39.0–52.0)
HCT: 36.7 % — ABNORMAL LOW (ref 39.0–52.0)
HCT: 43.8 % (ref 39.0–52.0)
Hemoglobin: 11.4 g/dL — ABNORMAL LOW (ref 13.0–17.0)
Hemoglobin: 15.5 g/dL (ref 13.0–17.0)
MCHC: 35.4 g/dL (ref 30.0–36.0)
MCV: 93.6 fL (ref 78.0–100.0)
MCV: 94.1 fL (ref 78.0–100.0)
MCV: 94.5 fL (ref 78.0–100.0)
Platelets: 115 10*3/uL — ABNORMAL LOW (ref 150–400)
Platelets: 118 10*3/uL — ABNORMAL LOW (ref 150–400)
Platelets: 125 10*3/uL — ABNORMAL LOW (ref 150–400)
Platelets: 136 10*3/uL — ABNORMAL LOW (ref 150–400)
Platelets: 88 10*3/uL — ABNORMAL LOW (ref 150–400)
RBC: 3.17 MIL/uL — ABNORMAL LOW (ref 4.22–5.81)
RBC: 3.52 MIL/uL — ABNORMAL LOW (ref 4.22–5.81)
RDW: 13 % (ref 11.5–15.5)
RDW: 13 % (ref 11.5–15.5)
RDW: 13.1 % (ref 11.5–15.5)
WBC: 11 10*3/uL — ABNORMAL HIGH (ref 4.0–10.5)
WBC: 13.7 10*3/uL — ABNORMAL HIGH (ref 4.0–10.5)
WBC: 14.6 10*3/uL — ABNORMAL HIGH (ref 4.0–10.5)
WBC: 6.8 10*3/uL (ref 4.0–10.5)
WBC: 9.4 10*3/uL (ref 4.0–10.5)
WBC: 4.3 10*3/uL (ref 4.0–10.5)

## 2010-09-16 LAB — POCT I-STAT 3, ART BLOOD GAS (G3+)
Bicarbonate: 21.9 mEq/L (ref 20.0–24.0)
Bicarbonate: 23.2 mEq/L (ref 20.0–24.0)
Bicarbonate: 25.1 mEq/L — ABNORMAL HIGH (ref 20.0–24.0)
O2 Saturation: 100 %
O2 Saturation: 100 %
Patient temperature: 35.4
TCO2: 23 mmol/L (ref 0–100)
TCO2: 28 mmol/L (ref 0–100)
pCO2 arterial: 42.5 mmHg (ref 35.0–45.0)
pCO2 arterial: 44.6 mmHg (ref 35.0–45.0)
pH, Arterial: 7.345 — ABNORMAL LOW (ref 7.350–7.450)
pH, Arterial: 7.372 (ref 7.350–7.450)
pO2, Arterial: 113 mmHg — ABNORMAL HIGH (ref 80.0–100.0)
pO2, Arterial: 274 mmHg — ABNORMAL HIGH (ref 80.0–100.0)
pO2, Arterial: 442 mmHg — ABNORMAL HIGH (ref 80.0–100.0)

## 2010-09-16 LAB — PROTIME-INR
INR: 1.3 (ref 0.00–1.49)
INR: 1.4 (ref 0.00–1.49)
INR: 1.5 (ref 0.00–1.49)
Prothrombin Time: 16.1 seconds — ABNORMAL HIGH (ref 11.6–15.2)
Prothrombin Time: 16.9 seconds — ABNORMAL HIGH (ref 11.6–15.2)
Prothrombin Time: 17.2 seconds — ABNORMAL HIGH (ref 11.6–15.2)

## 2010-09-16 LAB — POCT I-STAT 4, (NA,K, GLUC, HGB,HCT)
Glucose, Bld: 121 mg/dL — ABNORMAL HIGH (ref 70–99)
Glucose, Bld: 129 mg/dL — ABNORMAL HIGH (ref 70–99)
Glucose, Bld: 95 mg/dL (ref 70–99)
HCT: 29 % — ABNORMAL LOW (ref 39.0–52.0)
HCT: 32 % — ABNORMAL LOW (ref 39.0–52.0)
HCT: 33 % — ABNORMAL LOW (ref 39.0–52.0)
HCT: 38 % — ABNORMAL LOW (ref 39.0–52.0)
Hemoglobin: 11.2 g/dL — ABNORMAL LOW (ref 13.0–17.0)
Hemoglobin: 11.2 g/dL — ABNORMAL LOW (ref 13.0–17.0)
Hemoglobin: 12.9 g/dL — ABNORMAL LOW (ref 13.0–17.0)
Hemoglobin: 9.9 g/dL — ABNORMAL LOW (ref 13.0–17.0)
Potassium: 4 mEq/L (ref 3.5–5.1)
Potassium: 4.1 mEq/L (ref 3.5–5.1)
Potassium: 4.6 mEq/L (ref 3.5–5.1)
Potassium: 5.1 mEq/L (ref 3.5–5.1)
Sodium: 133 mEq/L — ABNORMAL LOW (ref 135–145)
Sodium: 134 mEq/L — ABNORMAL LOW (ref 135–145)
Sodium: 137 mEq/L (ref 135–145)
Sodium: 139 mEq/L (ref 135–145)
Sodium: 141 mEq/L (ref 135–145)

## 2010-09-16 LAB — BASIC METABOLIC PANEL
BUN: 14 mg/dL (ref 6–23)
BUN: 18 mg/dL (ref 6–23)
Calcium: 7.7 mg/dL — ABNORMAL LOW (ref 8.4–10.5)
Calcium: 7.8 mg/dL — ABNORMAL LOW (ref 8.4–10.5)
Creatinine, Ser: 0.86 mg/dL (ref 0.4–1.5)
Creatinine, Ser: 1.07 mg/dL (ref 0.4–1.5)
GFR calc Af Amer: >60 mL/min (ref >60)
GFR calc non Af Amer: >60 mL/min (ref >60)
Glucose, Bld: 120 mg/dL — ABNORMAL HIGH (ref 70–99)

## 2010-09-16 LAB — POCT I-STAT, CHEM 8
BUN: 13 mg/dL (ref 6–23)
Calcium, Ion: 1.09 mmol/L — ABNORMAL LOW (ref 1.12–1.32)
Chloride: 103 mEq/L (ref 96–112)
Chloride: 109 mEq/L (ref 96–112)
Glucose, Bld: 133 mg/dL — ABNORMAL HIGH (ref 70–99)
HCT: 26 % — ABNORMAL LOW (ref 39.0–52.0)
HCT: 30 % — ABNORMAL LOW (ref 39.0–52.0)
Hemoglobin: 8.8 g/dL — ABNORMAL LOW (ref 13.0–17.0)
Potassium: 4 mEq/L (ref 3.5–5.1)
Potassium: 5 mEq/L (ref 3.5–5.1)
Sodium: 140 mEq/L (ref 135–145)

## 2010-09-16 LAB — BLOOD GAS, ARTERIAL
Acid-Base Excess: 1.2 mmol/L (ref 0.0–2.0)
O2 Saturation: 97.4 %
TCO2: 26.1 mmol/L (ref 0–100)
pCO2 arterial: 37.7 mmHg (ref 35.0–45.0)
pO2, Arterial: 85.8 mmHg (ref 80.0–100.0)

## 2010-09-16 LAB — MAGNESIUM
Magnesium: 2.2 mg/dL (ref 1.5–2.5)
Magnesium: 2.2 mg/dL (ref 1.5–2.5)
Magnesium: 2.3 mg/dL (ref 1.5–2.5)

## 2010-09-16 LAB — TYPE AND SCREEN: ABO/RH(D): O POS

## 2010-09-16 LAB — URINE MICROSCOPIC-ADD ON

## 2010-09-16 LAB — URINALYSIS, ROUTINE W REFLEX MICROSCOPIC
Ketones, ur: NEGATIVE mg/dL
Leukocytes, UA: NEGATIVE
Nitrite: NEGATIVE
pH: 6 (ref 5.0–8.0)

## 2010-09-16 LAB — COMPREHENSIVE METABOLIC PANEL
Alkaline Phosphatase: 59 U/L (ref 39–117)
BUN: 12 mg/dL (ref 6–23)
Chloride: 105 mEq/L (ref 96–112)
Glucose, Bld: 111 mg/dL — ABNORMAL HIGH (ref 70–99)
Potassium: 4.3 mEq/L (ref 3.5–5.1)
Total Bilirubin: 0.9 mg/dL (ref 0.3–1.2)

## 2010-09-16 LAB — HEMOGLOBIN A1C
Hgb A1c MFr Bld: 5.8 % (ref 4.6–6.1)
Mean Plasma Glucose: 120 mg/dL

## 2010-09-16 LAB — GLUCOSE, CAPILLARY
Glucose-Capillary: 109 mg/dL — ABNORMAL HIGH (ref 70–99)
Glucose-Capillary: 141 mg/dL — ABNORMAL HIGH (ref 70–99)
Glucose-Capillary: 150 mg/dL — ABNORMAL HIGH (ref 70–99)

## 2010-09-16 LAB — CREATININE, SERUM
Creatinine, Ser: 0.78 mg/dL (ref 0.4–1.5)
GFR calc non Af Amer: >60 mL/min (ref >60)

## 2010-09-17 LAB — POCT I-STAT 3, ART BLOOD GAS (G3+)
Bicarbonate: 25.2 mEq/L — ABNORMAL HIGH (ref 20.0–24.0)
TCO2: 26 mmol/L (ref 0–100)
pH, Arterial: 7.371 (ref 7.350–7.450)

## 2010-09-17 LAB — POCT I-STAT 3, VENOUS BLOOD GAS (G3P V)
TCO2: 27 mmol/L (ref 0–100)
pH, Ven: 7.328 — ABNORMAL HIGH (ref 7.250–7.300)

## 2010-10-12 ENCOUNTER — Ambulatory Visit (INDEPENDENT_AMBULATORY_CARE_PROVIDER_SITE_OTHER): Payer: BC Managed Care – PPO | Admitting: *Deleted

## 2010-10-12 DIAGNOSIS — Z7901 Long term (current) use of anticoagulants: Secondary | ICD-10-CM

## 2010-10-12 DIAGNOSIS — Q231 Congenital insufficiency of aortic valve: Secondary | ICD-10-CM

## 2010-10-12 DIAGNOSIS — G459 Transient cerebral ischemic attack, unspecified: Secondary | ICD-10-CM

## 2010-10-12 LAB — POCT INR: INR: 2

## 2010-10-12 MED ORDER — WARFARIN SODIUM 5 MG PO TABS: ORAL_TABLET | ORAL | Status: DC

## 2010-10-20 ENCOUNTER — Encounter: Payer: Self-pay | Admitting: Family Medicine

## 2010-10-20 ENCOUNTER — Ambulatory Visit (INDEPENDENT_AMBULATORY_CARE_PROVIDER_SITE_OTHER): Payer: BC Managed Care – PPO | Admitting: Family Medicine

## 2010-10-20 VITALS — BP 136/70

## 2010-10-20 DIAGNOSIS — H6590 Unspecified nonsuppurative otitis media, unspecified ear: Secondary | ICD-10-CM

## 2010-10-20 NOTE — Progress Notes (Signed)
  Subjective:    Patient ID: Samuel [name], male    DOB: 1951/06/05, 60 y.o.   MRN: [id number]  HPI Patient seen as a work in with sensation of left ear stopped up past couple of weeks. He has history of chronic suppurative otitis and tympanostomy tube that side few years ago. He's not sure if this is still draining/ in place. Denies any drainage from the ear canal. Was treated with some type of antibiotic drops per [facility] few weeks ago which did not help much. No fever or chills. Initially had severe pain but none now. Denies any vertigo symptoms.  History of bicuspid aortic valve with aortic stenosis. Has done well following aortic valve replacement. Is maintained on Coumadin followed through the Coumadin clinic   Review of Systems  Constitutional: Negative for fever and chills.  HENT: Positive for hearing loss. Negative for ear pain, tinnitus and ear discharge.        Objective:   Physical Exam  Constitutional: He appears well-developed and well-nourished.  HENT:  Right Ear: External ear normal.  Mouth/Throat: Oropharynx is clear and moist. No oropharyngeal exudate.       Left ear canal is full of cerumen. There is a tube visible but not fully visible secondary to cerumen. Not clear if this is in the tympanic membrane  Cardiovascular: Normal rate and regular rhythm.        Prominent click from aortic valve  Pulmonary/Chest: Effort normal and breath sounds normal. No respiratory distress. He has no wheezes. He has no rales.          Assessment & Plan:  Chronic otitis media with effusion, left ear. History of tympanostomy tube which is currently out of place. The debris removed from the ear canal without difficulty with suction. Recommend ENT referral

## 2010-10-25 NOTE — Assessment & Plan Note (Signed)
OFFICE VISIT   Samuel [name], Samuel [name] A  DOB:  1951-01-31                                        February 09, 2009  CHART #:  [id number]   HISTORY:  The patient returned to my office today for further discussion  of planned aortic valve replacement.  I saw him on February 03, 2009, and  did a new patient consultation at that time.  He was sent for a CT scan  of the chest to evaluate his ascending aorta since it appeared somewhat  enlarged by cardiac catheterization and was concerned that he may have  an ascending aneurysm associated with his bicuspid aortic valve disease.  Since I last saw him, he has been feeling about the same.  He still has  exertional dyspnea, but he has had no chest pain.  He has had some  episodes of dizziness.   PHYSICAL EXAMINATION:  Vital Signs:  Today, his blood pressure is  129/92, his pulse is 86 and regular, respiratory rate is 18 and  unlabored.  He looks well.  Cardiac:  Regular rate and rhythm with a 3/6  systolic murmur over the aorta.  There is no diastolic murmur.  Lungs:  Clear.   His CT angiogram of the chest shows the maximum diameter of his  ascending aorta to be 4.6 cm just above the sinotubular junction and  extending up to the mid ascending aorta.  He has fusiform enlargement of  the entire ascending aorta.  At the level of his left coronary ostia,  the aorta is about 4.2 cm.  His aorta returns to more normal size in the  proximal arch what measures about 3 cm.  The distal arch measures about  2.7 cm and the descending aorta measures about 2.7 cm.   IMPRESSION:  The patient has severe aortic stenosis with a probable  bicuspid aortic valve.  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  Given his relatively young age, I  think it would be best to replace the ascending aorta at the same time  of his valve replacement.  I will plan to use a mechanical valve conduit  with reimplantation of his  coronary arteries into the conduit.  This  will prevent progressive aneurysmal dilatation of his ascending aorta  and need for further surgery for that in the not-too-distant future.  I  discussed the operative procedure with him including alternatives,  benefits, and risks including but not limited to bleeding, blood  transfusion, infection, stroke, myocardial infarction, heart block  requiring permanent pacemaker, and death.  He understands all of these  and agrees to proceed.  He will call our office back in the next few  days to schedule surgery.  He would like to wait about 2-3 weeks so that  he can make arrangements of workup.   Evelene Croon, M.D.  Electronically Signed   BB/MEDQ  D:  02/09/2009  T:  02/10/2009  Job:  [id number]

## 2010-10-25 NOTE — Assessment & Plan Note (Signed)
OFFICE VISIT   HARLO, FABELA A  DOB:  1951-02-04                                        March 23, 2009  CHART #:  [id number]   The patient returned to my office today for follow up status post  Bentall procedure with a 25-mm St. Jude mechanical valve conduit and a  28-mm Hemashield tube graft for bicuspid aortic valve with severe aortic  stenosis and an ascending aortic aneurysm.  This procedure was performed  on 02/25/2009.  He has been feeling well overall and is walking about  one mile per day, currently in about 19 minutes.  He said that he feels  he could go faster.  He has had mild incisional discomfort but is only  taking Tylenol.  He denies any shortness of breath.  He said he is still  having occasional episodes of a sudden severe anterior chest pain that  feels like it is deep inside.  He has had these for a long time even  preoperatively.  He had a couple of episodes while in the hospital.  Etiology of this has never been elucidated.  He said the most recent  episode was brought on after he drank some very cold liquid.  He said  the discomfort is pretty severe and resolves after a few minutes without  intervention.   PHYSICAL EXAMINATION:  Vital Signs:  Today, his blood pressure 107/78,  pulse 72 and regular.  His respiratory rate is 18 and unlabored.  Oxygen  saturation on room air is 98%.  General:  He looks well.  Cardiac:  A  regular rate and rhythm with crisp mechanical valve click.  There is no  murmur, rub or gallop.  Lungs:  Clear.  Chest:  The chest incision is  healing well and the sternum is stable.  Extremities:  There is no  peripheral edema.   Followup chest x-ray shows clear lung fields and no pleural effusions.   His medications are Levoxyl 100 mcg daily, Lopressor 12.5 mg b.i.d., and  Coumadin 7.5 mg daily.  He is also taking Tylenol for pain.   IMPRESSION:  Overall, the patient is making a good recovery following  his  surgery.  The episodes of sudden severe chest pain that he has had  for a long time preoperatively as well as postoperatively have an  unclear etiology.  I do not think these are related to his heart.  He  said that these have been brought on by drinking cold liquids in the  past, his most recent episode occurred like this.  I suspect that he may  be having some esophageal spasm brought on by a stimulating event such  as drinking cold liquids.  He may benefit from referral to  gastroenterologist for workup of this to rule out an esophageal motility  disorder.  He will discuss this further with Dr. Tenny Craw.  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  He has a follow up appointment later this week with Dr. Tenny Craw and will  contact me if he develops any problems with his incisions.   Evelene Croon, M.D.  Electronically Signed   BB/MEDQ  D:  03/23/2009  T:  03/24/2009  Job:  [id number]   cc:   [name], M.D.

## 2010-10-25 NOTE — Consult Note (Signed)
NEW PATIENT CONSULTATION   Samuel [name], Samuel [name]  DOB:  11-04-1950                                        February 03, 2009  CHART #:  [id number]   REFERRING PHYSICIAN:  Pricilla Riffle, MD, [facility]   REASON FOR CONSULTATION:  Severe aortic stenosis.   CLINICAL HISTORY:  I was asked by Dr. Tenny Craw to evaluate the patient for  consideration of aortic valve replacement for severe aortic stenosis.   He is [name] 61 year old gentleman with [name] known history of aortic stenosis  that has been followed by echocardiogram over the years.  He had an  echocardiogram in 2007, showing mild-to-moderate aortic valve stenosis  with [name] mean gradient of 18.5 and an aortic valve area of 1.15 cm2.  There is reportedly mild aortic root dilatation and ejection fraction of  60%-65%.  He was on business trip in early August and woke up and was  getting dressed, when he noticed that he could not see out of the lower  part of his right eye.  These symptoms lasted about 10 minutes and then  spontaneously resolved.  She underwent workup including carotid Doppler  examination, which was normal.  He had an MRI of the brain as well as an  MR angiogram of the cerebral vessels and these were both negative.  She  underwent [name] repeat echocardiogram on January 26, 2009, which showed  ejection fraction of 55%-60%.  There was moderate concentric left  ventricular hypertrophy with Doppler parameters consistent with  diastolic dysfunction.  The aortic valve was felt to be possibly  bicuspid and severely calcified with severe stenosis and trivial  regurgitation.  The aortic valve area was calculated at 0.55 cm2 with [name]  mean gradient of 59, [name] peak gradient of 90.  The mitral valve was  structurally normal without stenosis or regurgitation.  He subsequently  underwent cardiac catheterization on January 28, 2009, which showed a PA  pressure of 29/12 with [name] mean of 18.  Right ventricular pressure was  28/10.  Wedge pressure was 10.   Cardiac index was 2.5.  There was  heavily calcified aortic valve.  There is no significant coronary artery  disease.  The patient did have some ascending aortic dilatation, and  this was also noticed by echocardiogram.  By 2-D echo, his aortic root  diameter was 40 mm and the aortic diameter at the level of the  sinotubular junction was about 44 mm.   REVIEW OF SYSTEMS:  GENERAL:  He has had no fever or chills.  He has had  no recent weight changes.  He has had no change of his appetite.  He  does report some fatigue.  EYES:  His visual changes have completely resolved.  He has had no  further problems.  ENT:  Negative.  ENDOCRINE:  He has no history of diabetes or hyperthyroidism.  He does  have [name] history of hypothyroidism.  He has been on replacement therapy.  CARDIOVASCULAR:  He denies any chest pain or pressure.  He has had  exertional dyspnea.  He denies PND or orthopnea.  He has had no  peripheral edema or palpitations.  RESPIRATORY:  Denies cough and sputum production.  GI:  He has had no nausea, vomiting.  He denies dysphagia.  He has  occasionally noticed small amount of blood in  his stool.  GU:  He has  had no dysuria or hematuria.  VASCULAR:  He has had no claudication or phlebitis.  He has never had [name]  DVT.  NEUROLOGIC:  He does have occasional dizziness particularly with  position changes.  He has never had syncope.  He denies any focal  weakness or numbness.  MUSCULOSKELETAL:  He has had no arthralgias or myalgias.  HEMATOLOGIC:  He has no history of bleeding disorders or easy bleeding.   ALLERGIES:  Penicillin and shellfish, which causes anaphylaxis.   MEDICATIONS:  1. Levoxyl 100 mcg daily.  2. Aspirin 81 mg daily.   PAST MEDICAL HISTORY:  Significant for history of aortic valve disease  with [name] presumed bicuspid valve.  He has [name] history of hypothyroidism.   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  He  is married, has 3 children.   He smoked in the past, quit about 16 years  ago.  He drinks [name] couple of cocktails per night.   FAMILY HISTORY:  Significant for his father having [name] history of aortic  valve disease.   PHYSICAL EXAMINATION:  Vital Signs:  Blood pressure 119/75.  His pulse  is 98 and regular.  His respiratory rate is 16, unlabored.  Oxygen  saturation on room air is 96%.  General:  He is [name] well-developed white  male in no distress.  HEENT:  Normocephalic and atraumatic.  Pupils are  equal and reactive to light and accommodation.  Extraocular muscles are  intact.  Throat is clear.  Neck:  Normal carotid pulses bilaterally.  There is transmitted murmur on both sides of the neck.  There is no  adenopathy or thyromegaly.  Cardiac:  Regular rate and rhythm with [name]  grade 3/6 systolic murmur over the aorta.  There is no diastolic murmur.  Lungs:  Clear.  Abdomen:  Active bowel sounds.  Abdomen is soft and  nontender.  There are no palpable masses or organomegaly.  Extremities:  No peripheral edema.  Neurologic:  Alert and oriented x3.  Motor and  sensory exam is grossly normal.  Skin:  Warm and dry.   LABORATORY EXAMINATION:  Normal electrolytes with BUN of 16 and  creatinine of 1.1.  His hemoglobin was 15.3, white blood cell count 4.6,  platelet count 177,000.   IMPRESSION:  The patient has severe aortic stenosis with probable  bicuspid aortic valve.  He does have some dilatation of his ascending  aorta noted by echocardiogram and catheterization.  I think it would be  worthwhile doing [name] CT angiogram of the chest to get an exact measurement  of his aortic diameter throughout the thoracic aorta to determine if he  should have his ascending aorta replaced at the same time.  Bicuspid  valves are commonly associated with ascending aortic aneurysms.  I agree  that surgical treatment of his aortic valve disease is indicated this  time.  I discussed aortic valve replacement with he and his wife  including use of [name]  mechanical valve given his age.  We discussed the  pros and cons of mechanical valves and tissue valves, and I have  recommended [name] mechanical valve.  I think that given his age and the fact  that he has normal coronary arteries that [name] tissue valve would require  replacement in the future for structural valve deterioration.  I  discussed the need for lifelong anticoagulation as well as the 1%-2%  risks per year  of thromboembolic complications and 2%-per-year risk of  anticoagulant-related bleeding.  He is not thrilled about having to take  Coumadin, but said he would rather do that and risk needing [name] redo  aortic valve replacements in the future.  I discussed the benefits and  risks of surgery including but not limited to bleeding, blood  transfusion, infection, stroke, myocardial infarction, heart block  requiring permanent pacemaker, organ failure, and death.  Also discussed  the possibility that we may need to replace his ascending aorta using [name]  Dacron graft depending on the findings on [name] CT scan.  We will schedule  the CT scan for later this week, and I will plan to see him in the  office in next week to discuss this further with him.   Evelene Croon, M.D.  Electronically Signed   BB/MEDQ  D:  02/03/2009  T:  02/04/2009  Job:  [id number]   cc:   Pricilla Riffle, MD, [facility]  [name], M.D.

## 2010-10-25 NOTE — Cardiovascular Report (Signed)
NAME:  Samuel [name], Samuel [name]                 ACCOUNT NO.:  [id number]   MEDICAL RECORD NO.:  [id number]          PATIENT TYPE:  OIB   LOCATION:  1963                         FACILITY:  [facility]   PHYSICIAN:  Veverly Fells. Excell Seltzer, MD  DATE OF BIRTH:  1951-06-07   DATE OF PROCEDURE:  01/28/2009  DATE OF DISCHARGE:  01/28/2009                            CARDIAC CATHETERIZATION   PROCEDURE:  Right heart catheterization, selective coronary angiography.   PROCEDURAL INDICATIONS:  This is a 61 year old gentleman with severe  aortic stenosis.  He was referred for right heart catheterization and  coronary angiography prior to his upcoming valve replacement.   Risks and indications of the procedure were reviewed with the patient.  Informed consent was obtained.  The right groin was prepped, draped, and  anesthetized with 1% lidocaine.  Using modified Seldinger technique, 4-  French sheath was placed in the right femoral artery and a 7-French  sheath was placed in the right femoral vein.  Standard 4-French Judkins  catheters were used for coronary angiography.  A Swan-Ganz catheter was  used for the right heart catheterization.  Pressures were recorded  throughout the right heart chambers.   The patient tolerated the procedure well.  All catheter exchanges were  performed over guidewire.  There were no immediate complications.   FINDINGS:  Aortic saturations 96%, pulmonary artery oxygen saturation  72%, right atrial pressure has a mean of 3, right ventricular pressure  was 28/10, pulmonary artery pressure was 29/12 with a mean of 18,  pulmonary capillary wedge pressure was 10, aortic pressure was 119/78  with a mean of 96.  Cardiac output by the Fick method is 5.7 liters per  minute.  Cardiac index is 2.5 liters per minute per meter squared.   CORONARY ANGIOGRAPHY:  Left mainstem is widely patent.  It trifurcates  into the LAD, circumflex, and intermediate branch.   LAD:  The LAD is a large-caliber  vessel.  There is moderate tortuosity  in the midportion of the vessel.  The LAD supplies two moderate diagonal  branches, both arising from the midportion of the vessel.  There was no  significant stenosis throughout the LAD or its diagonal branches.   Ramus intermedius:  This is a moderate-sized vessel with no significant  obstructive disease.   Left circumflex:  The left circumflex is small.  It supplies two small  OM sub-branches.  There was no significant stenosis involving the left  circumflex.   Right coronary artery:  The right coronary artery is a large vessel.  There is minimal nonobstructive plaque in the proximal right coronary  artery.  The midvessel is widely patent.  The distal vessel was widely  patent.  The RCA terminates in a large PDA branch and two posterolateral  branches.   Aortic root angiography demonstrates mild dilatation of the ascending  aorta.  There was heavy calcification of the aortic valve and there was  moderate 2+ aortic insufficiency.   ASSESSMENT:  1. Widely patent coronary arteries without significant obstructive      disease.  2. Normal right heart hemodynamics.  3. Heavily  calcified aortic valve with mild aortic insufficiency.  4. Known severe aortic stenosis (the aortic valve was not crossed).   PLAN:  The patient for upcoming aortic valve replacement.      Veverly Fells. Excell Seltzer, MD     MDC/MEDQ  D:  06/22/2009  T:  06/23/2009  Job:  [id number]

## 2010-11-24 ENCOUNTER — Encounter: Payer: BC Managed Care – PPO | Admitting: *Deleted

## 2010-12-08 ENCOUNTER — Encounter: Payer: Self-pay | Admitting: Internal Medicine

## 2011-01-05 ENCOUNTER — Telehealth: Payer: Self-pay | Admitting: Family Medicine

## 2011-01-05 MED ORDER — LEVOTHYROXINE SODIUM 112 MCG PO TABS: 112.0000 ug | ORAL_TABLET | Freq: Every day | ORAL | Status: DC

## 2011-01-05 NOTE — Telephone Encounter (Signed)
Please refill levothyroxine to [facility]----[address].

## 2011-01-25 ENCOUNTER — Encounter: Payer: Self-pay | Admitting: Family Medicine

## 2011-01-25 ENCOUNTER — Ambulatory Visit (INDEPENDENT_AMBULATORY_CARE_PROVIDER_SITE_OTHER): Payer: BC Managed Care – PPO | Admitting: Family Medicine

## 2011-01-25 VITALS — BP 110/78 | HR 99 | Temp 98.8°F | Wt 236.0 lb

## 2011-01-25 DIAGNOSIS — L080 Pyoderma: Secondary | ICD-10-CM

## 2011-01-25 DIAGNOSIS — E039 Hypothyroidism, unspecified: Secondary | ICD-10-CM

## 2011-01-25 DIAGNOSIS — H6690 Otitis media, unspecified, unspecified ear: Secondary | ICD-10-CM

## 2011-01-25 DIAGNOSIS — G4700 Insomnia, unspecified: Secondary | ICD-10-CM

## 2011-01-25 DIAGNOSIS — R21 Rash and other nonspecific skin eruption: Secondary | ICD-10-CM

## 2011-01-25 DIAGNOSIS — L989 Disorder of the skin and subcutaneous tissue, unspecified: Secondary | ICD-10-CM

## 2011-01-25 MED ORDER — CEPHALEXIN 500 MG PO CAPS: 500.0000 mg | ORAL_CAPSULE | Freq: Three times a day (TID) | ORAL | Status: AC

## 2011-01-25 MED ORDER — TEMAZEPAM 15 MG PO CAPS: 15.0000 mg | ORAL_CAPSULE | Freq: Every evening | ORAL | Status: DC | PRN

## 2011-01-25 NOTE — Progress Notes (Signed)
Subjective:    Patient ID: Samuel [name], male    DOB: 1951/04/04, 60 y.o.   MRN: [id number]  HPI Seen for several items as below  History of recurrent otitis media.  T tube several times. 2 week history of left ear fullness. Tympanostomy tube placed several months ago. No drainage. Denies fever or chills. Slightly decreased hearing left side. No headaches.  Recurrent small pustular sores mostly between upper lip and nose region. No intranasal involvement. Has seen dermatologist in the past and currently on doxycycline which does not seem to be helping. No history of MRSA.  Chronic intermittent insomnia. Has used Restoril in the past and requesting refill. Sleep hygiene has been discussed.  Right lower leg lesion present for several months. No clear changes in growth recently. No bleeding or itching. Scaly surface. No personal or family history of skin cancer.  Chronic problems include hypothyroidism, mild hyperlipidemia, history of bicuspid aortic valve with aortic stenosis and aortic valve replacement. Patient chronic Coumadin. No recent bleeding complications. Protimes through the Coumadin clinic.  Hypothyroidism. Takes Synthroid 112 mcg daily. Recently missed about 4 doses. TSH last January was elevated and we increased his dosage of medication then. No followup since January.   Review of Systems  Constitutional: Positive for fatigue. Negative for chills and unexpected weight change.  HENT: Negative for ear pain, sinus pressure and ear discharge.   Respiratory: Negative for cough, shortness of breath and wheezing.   Cardiovascular: Negative for chest pain, palpitations and leg swelling.  Gastrointestinal: Negative for abdominal pain and blood in stool.  Genitourinary: Negative for hematuria.  Skin: Positive for rash.  Neurological: Negative for dizziness, weakness and headaches.  Hematological: Does not bruise/bleed easily.       Objective:   Physical Exam  Constitutional: He is  oriented to person, place, and time. He appears well-developed and well-nourished.  HENT:  Right Ear: External ear normal.  Mouth/Throat: Oropharynx is clear and moist.       Left tympanostomy tube in place. No active drainage. Near the base of tube he has some thick yellowish drainage noted behind drum.  Eyes: Pupils are equal, round, and reactive to light.  Cardiovascular: Normal rate and regular rhythm.        Click noted from aortic valve  Pulmonary/Chest: Effort normal and breath sounds normal. No respiratory distress. He has no wheezes. He has no rales.  Musculoskeletal: He exhibits no edema.  Neurological: He is alert and oriented to person, place, and time.  Skin: Rash noted.       Rash above lip area and under nose. Erythema with a couple small yellowish pustules  Right anterior leg reveals one half centimeter skin lesion. Slightly elevated with hyperkeratotic verrucous-type surface. No bleeding or drainage  Psychiatric: He has a normal mood and affect. His behavior is normal.          Assessment & Plan:  #1 recurrent left otitis media. He has tympanostomy tube and question if this is draining appropriately as he has some thick yellow-appearing fluid visible behind the eardrum. Patient previously prescribed Cipro eardrops so we've recommended he get that filled and followup with ENT if persist. #2 pustular facial rash. Stop doxycycline. Keflex 500 mg 3 times a day for 10 days #3 chronic intermittent insomnia. Refill temazepam to use sparingly #4 hypothyroidism. Recheck TSH in one month after back on medication consistently #5 right leg lesion. Question verruca vulgaris. Discussed options including biopsy versus liquid nitrogen therapy and after going over  risk and benefits patient consented to liquid nitrogen. Treatment without difficulty. Patient needs biopsy if this is not resolving in 2-3 weeks with liquid nitrogen therapy

## 2011-01-25 NOTE — Patient Instructions (Signed)
Be in touch in 2-3 weeks if leg lesion not improving.  Would recommend biopsy if not resolving with liquid Nitrogen.

## 2011-01-26 ENCOUNTER — Other Ambulatory Visit: Payer: Self-pay | Admitting: Internal Medicine

## 2011-01-26 ENCOUNTER — Ambulatory Visit (INDEPENDENT_AMBULATORY_CARE_PROVIDER_SITE_OTHER): Payer: BC Managed Care – PPO | Admitting: *Deleted

## 2011-01-26 DIAGNOSIS — Z7901 Long term (current) use of anticoagulants: Secondary | ICD-10-CM

## 2011-01-26 DIAGNOSIS — Q231 Congenital insufficiency of aortic valve: Secondary | ICD-10-CM

## 2011-01-26 DIAGNOSIS — G459 Transient cerebral ischemic attack, unspecified: Secondary | ICD-10-CM

## 2011-01-26 MED ORDER — WARFARIN SODIUM 5 MG PO TABS: ORAL_TABLET | ORAL | Status: DC

## 2011-01-28 IMAGING — CR DG CHEST 2V
2 series · 2 of 2 positions shown · non-contrast
Comparison: None

CLINICAL DATA: Preop 02/25/2009 as.  Previous smoker.  cending
aortic aneurysm.

CHEST - 2 VIEW

[view not recorded (1 of 2)]
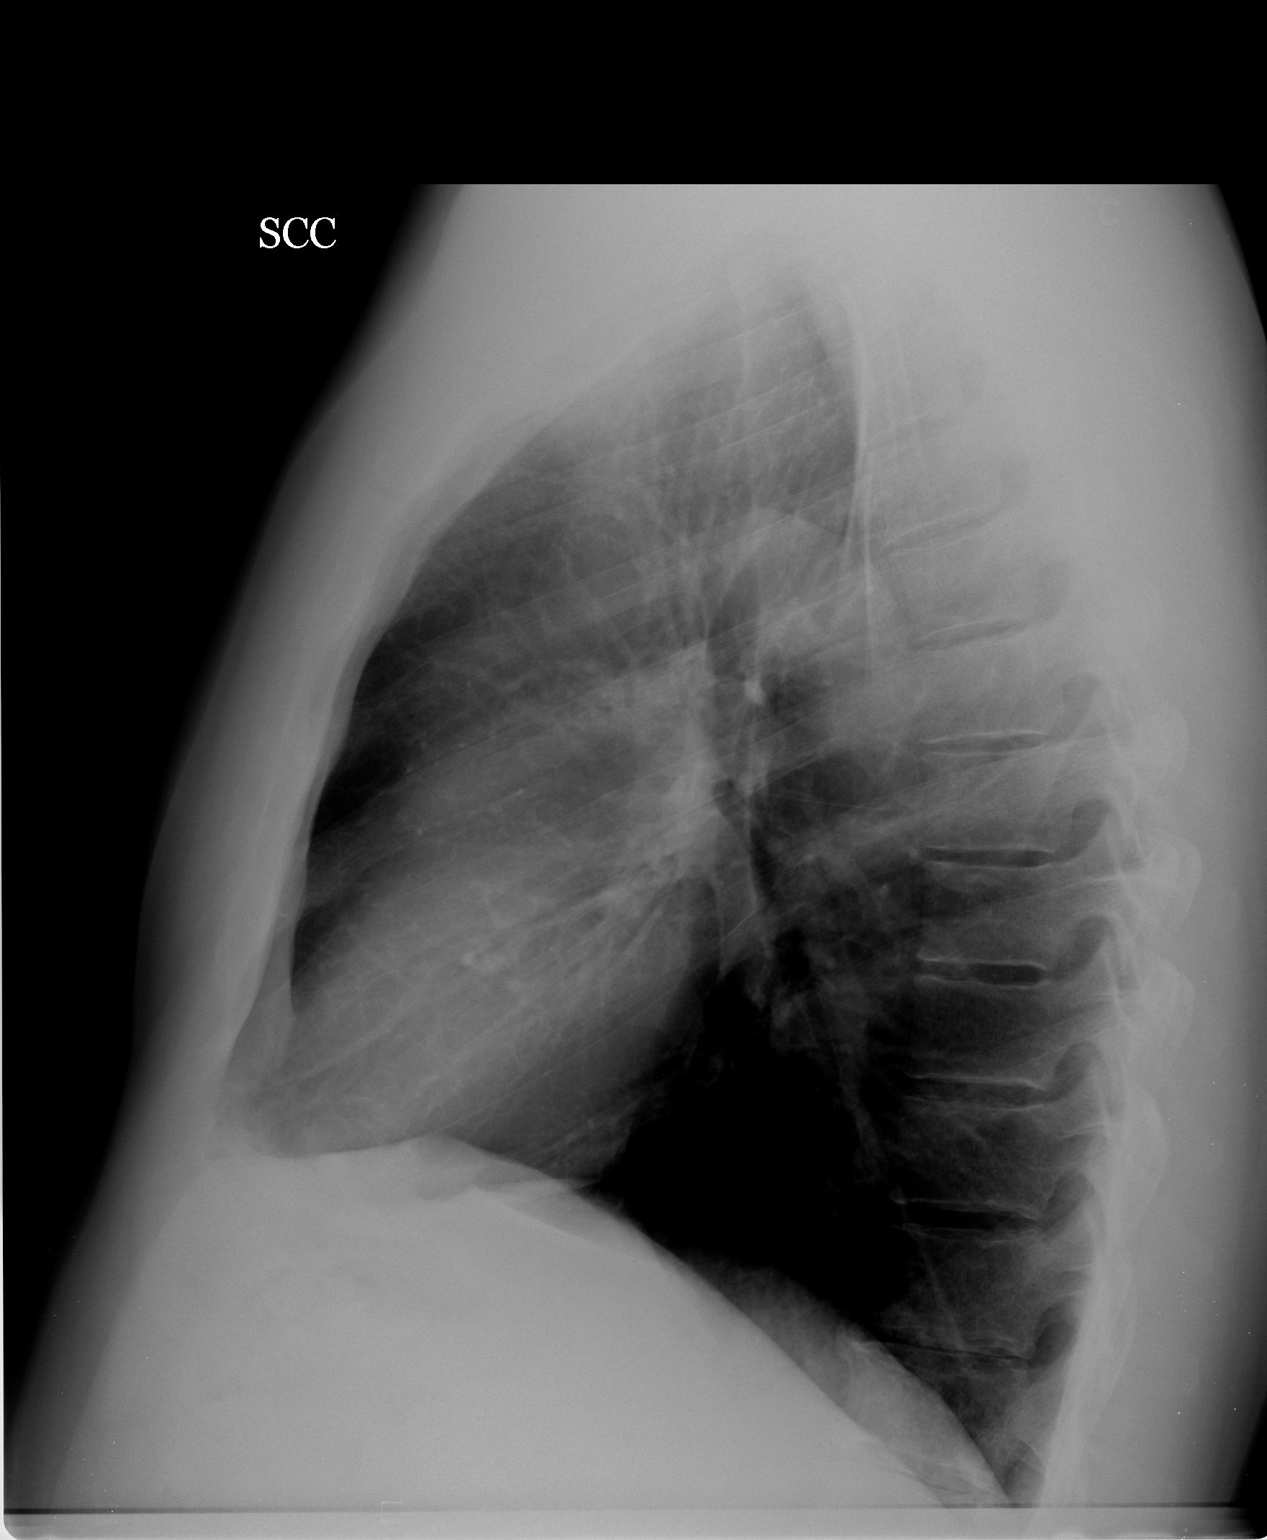

[view not recorded (2 of 2)]
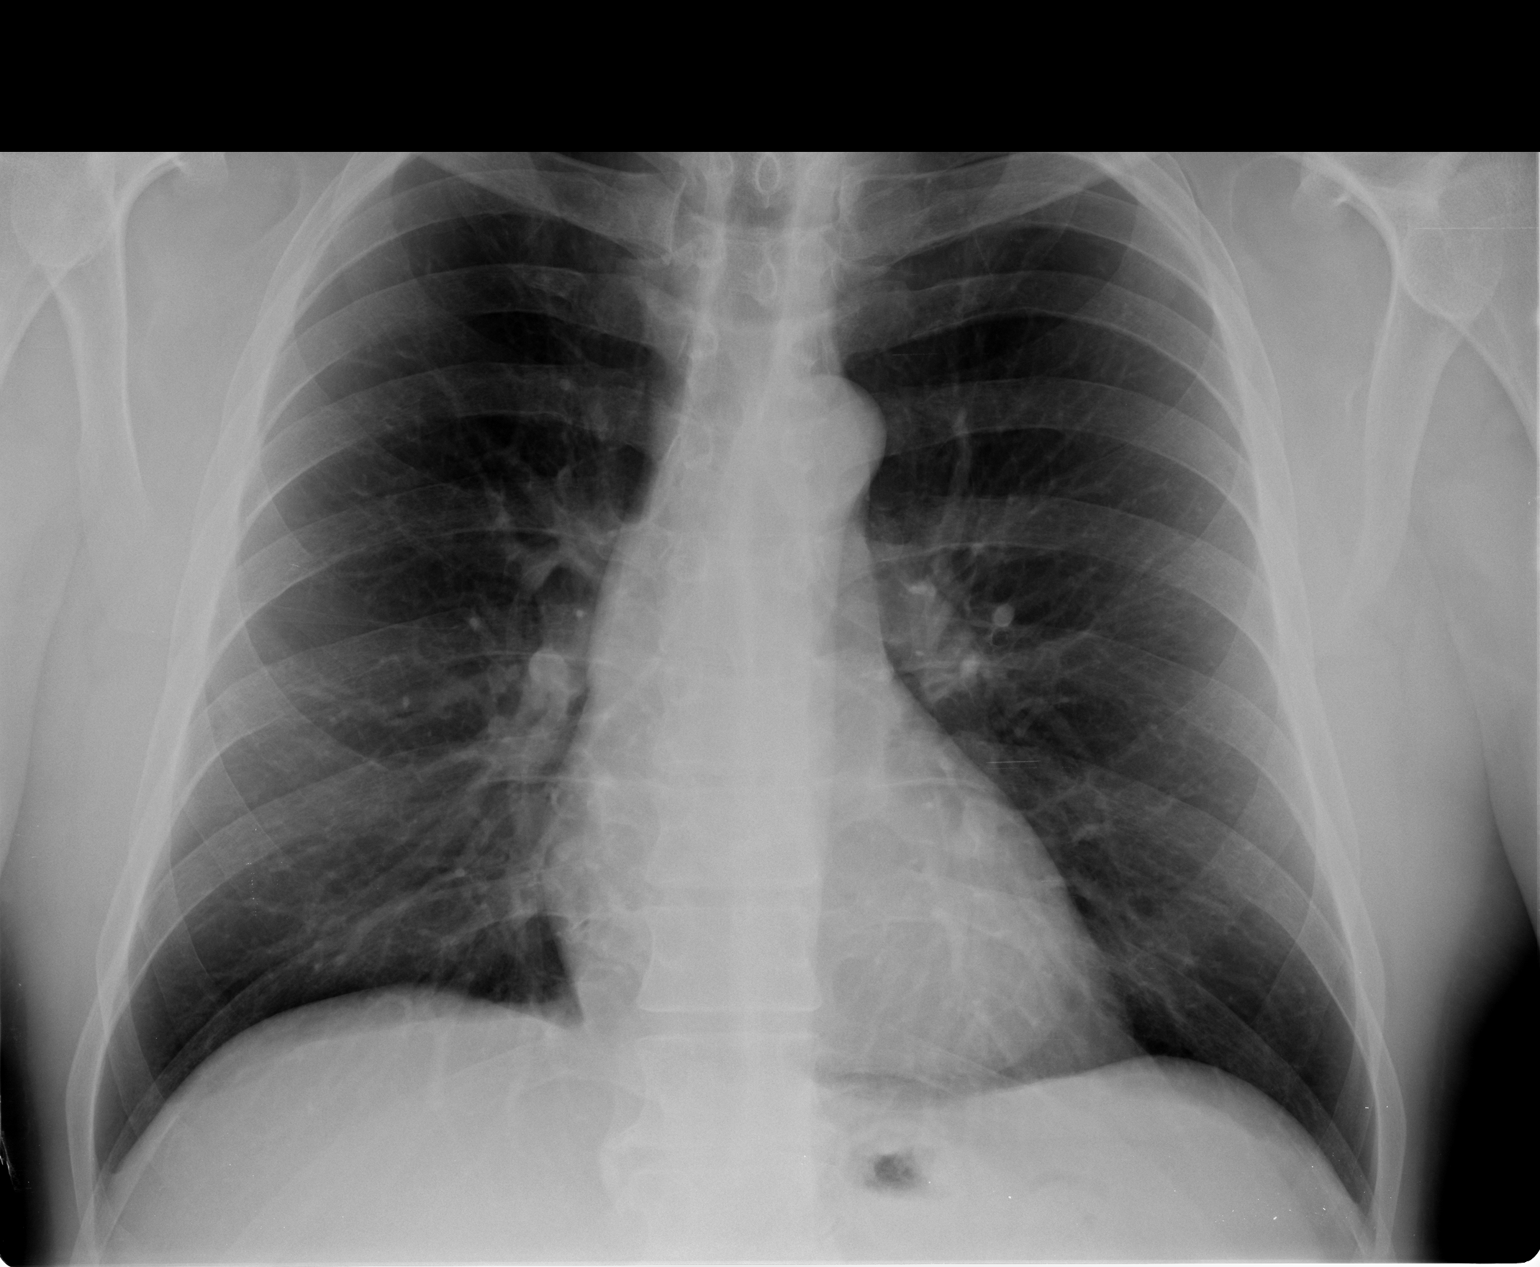

[2 of 2 positions shown; findings below may reference images not displayed]

FINDINGS: Prominent caliber of the ascending aorta.  Normal heart
size.  No pulmonary vascular congestion or active lung process.
IMPRESSION: Prominent caliber of the ascending aorta.

## 2011-01-31 IMAGING — CR DG CHEST 1V PORT
1 series · 1 of 1 positions shown · non-contrast
Comparison: 02/23/2009

CLINICAL DATA: Postop.  History of calcified aortic valve

PORTABLE CHEST - 1 VIEW

[AP]
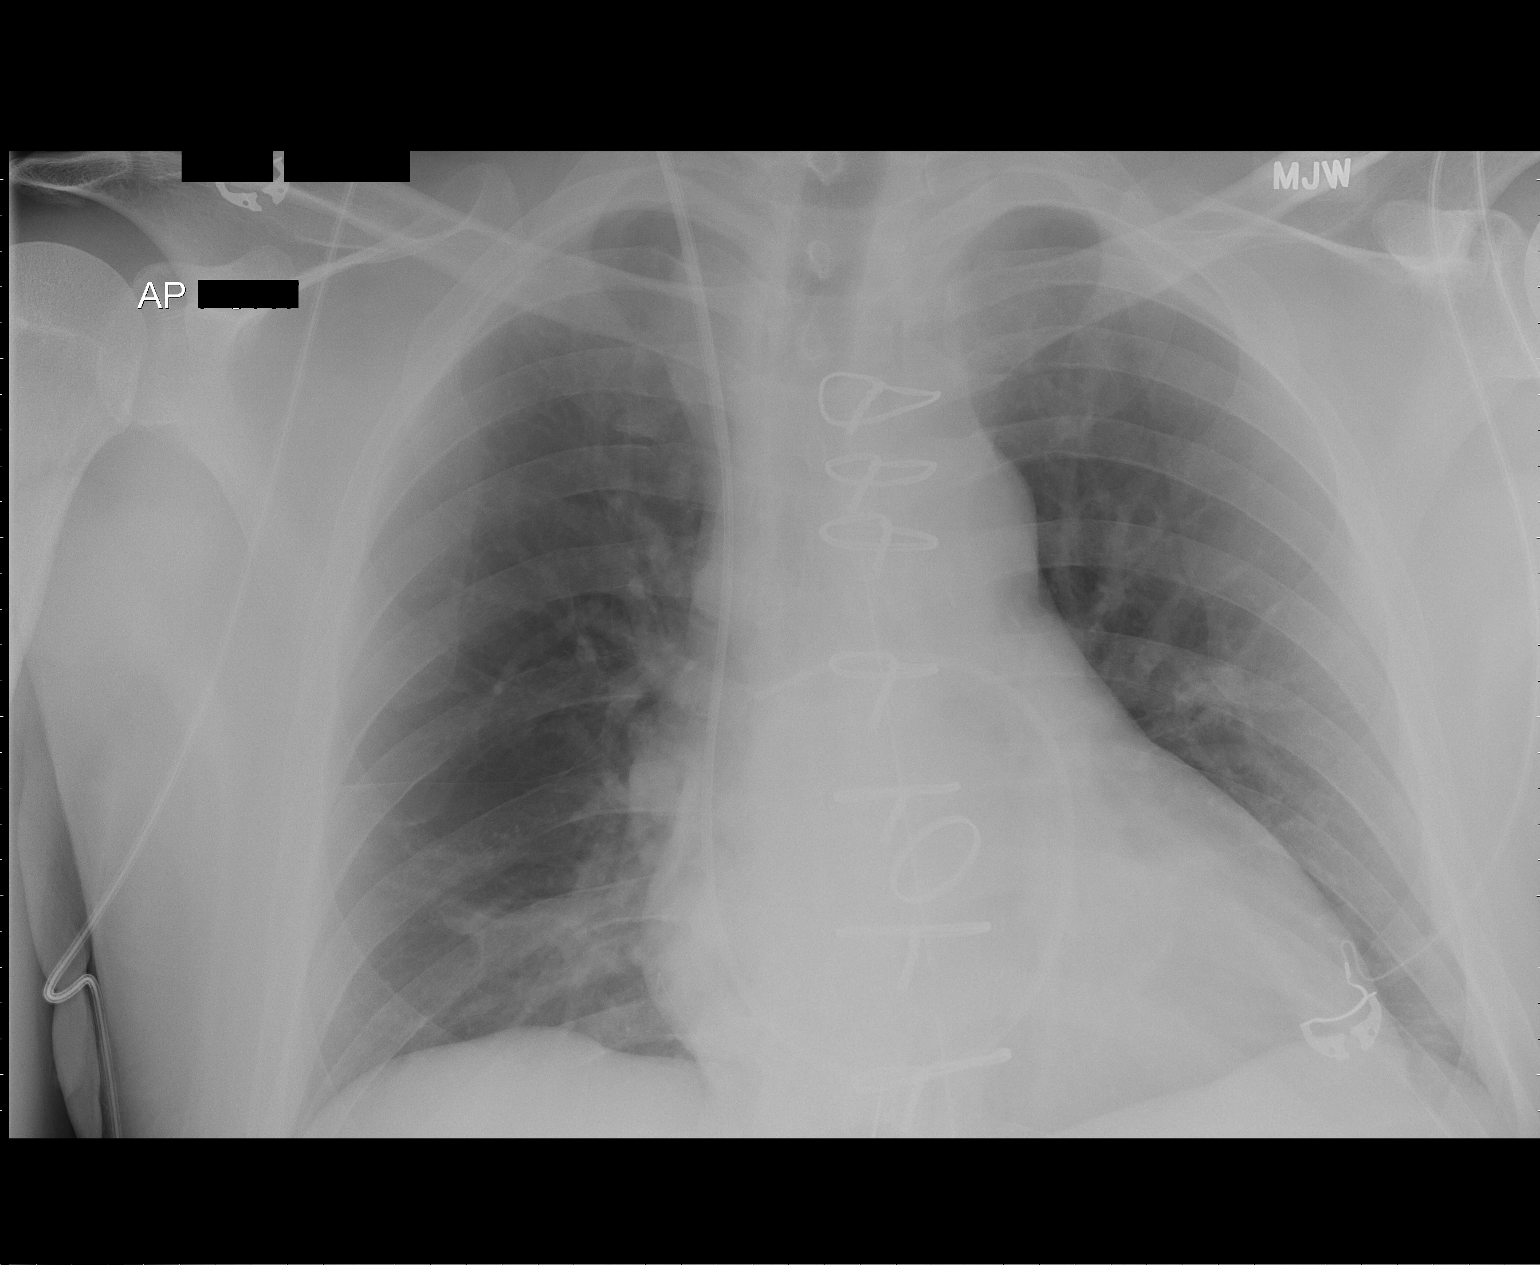

[1 of 1 positions shown; findings below may reference images not displayed]

FINDINGS: Median sternotomy wire sutures.  Aortic annular ring.
Mild cardiomegaly.  No CHF.  No pneumothorax. Mild subsegmental
atelectasis at the right base. Mediastinal chest tube in position.
Swan-Ganz catheter enters via right jugular vein approach.
Catheter tip is in the right PA centrally.
IMPRESSION: Satisfactory postoperative chest.  Mild cardiomegaly.  Mild right
base atelectasis.

## 2011-02-14 ENCOUNTER — Other Ambulatory Visit: Payer: Self-pay | Admitting: Family Medicine

## 2011-02-14 MED ORDER — DOXYCYCLINE HYCLATE 100 MG PO CAPS: 100.0000 mg | ORAL_CAPSULE | Freq: Two times a day (BID) | ORAL | Status: DC

## 2011-02-14 NOTE — Telephone Encounter (Signed)
Pt req refill of Doxycycline 100 mg to [facility] on [address]. Pt req 90 day supply.

## 2011-02-14 NOTE — Telephone Encounter (Signed)
Refill OK for #90. 

## 2011-02-14 NOTE — Telephone Encounter (Signed)
Please advise 

## 2011-02-22 ENCOUNTER — Other Ambulatory Visit: Payer: BC Managed Care – PPO

## 2011-03-09 ENCOUNTER — Encounter: Payer: BC Managed Care – PPO | Admitting: *Deleted

## 2011-04-19 ENCOUNTER — Ambulatory Visit (INDEPENDENT_AMBULATORY_CARE_PROVIDER_SITE_OTHER): Payer: BC Managed Care – PPO | Admitting: *Deleted

## 2011-04-19 DIAGNOSIS — Q231 Congenital insufficiency of aortic valve: Secondary | ICD-10-CM

## 2011-04-19 DIAGNOSIS — Z7901 Long term (current) use of anticoagulants: Secondary | ICD-10-CM

## 2011-04-19 DIAGNOSIS — G459 Transient cerebral ischemic attack, unspecified: Secondary | ICD-10-CM

## 2011-04-19 LAB — POCT INR: INR: 2.3

## 2011-04-24 ENCOUNTER — Other Ambulatory Visit: Payer: Self-pay | Admitting: Internal Medicine

## 2011-05-12 ENCOUNTER — Other Ambulatory Visit: Payer: Self-pay | Admitting: Family Medicine

## 2011-05-13 ENCOUNTER — Other Ambulatory Visit: Payer: Self-pay | Admitting: Family Medicine

## 2011-05-31 ENCOUNTER — Encounter: Payer: BC Managed Care – PPO | Admitting: *Deleted

## 2011-06-09 ENCOUNTER — Other Ambulatory Visit: Payer: Self-pay | Admitting: Internal Medicine

## 2011-06-15 ENCOUNTER — Ambulatory Visit (INDEPENDENT_AMBULATORY_CARE_PROVIDER_SITE_OTHER): Payer: BC Managed Care – PPO | Admitting: *Deleted

## 2011-06-15 DIAGNOSIS — Q231 Congenital insufficiency of aortic valve: Secondary | ICD-10-CM

## 2011-06-15 DIAGNOSIS — Z7901 Long term (current) use of anticoagulants: Secondary | ICD-10-CM

## 2011-06-15 DIAGNOSIS — G459 Transient cerebral ischemic attack, unspecified: Secondary | ICD-10-CM

## 2011-07-11 ENCOUNTER — Telehealth: Payer: Self-pay | Admitting: *Deleted

## 2011-07-11 NOTE — Telephone Encounter (Signed)
This does not sound right.  4000mg  of Doxycycline would be equivalent to 40 doxycycline tablets! Did they mean 400 mg doxycycline?  Patient has been prescribed doxycycline in past per his dermatologist.  I do not have any knowledge of [place] premedicating him previously with Doxycycline for dental work.  He does have history of aortic valve replacement so does need to be premedicated.  I don't see how he could have possibly taken 4000 mg of Doxycycline, but if he did would need to call poison control and promptly get INR reassessed.  200 mg of Doxycycline should be adequate for prevention.

## 2011-07-11 NOTE — Telephone Encounter (Signed)
[facility] dentist in [place] called to verify patient medication dosing for  Doxycyline. She stated patient informed her that he took 4000 mg (four thousand)of doxycline.  Dr Laverle Patter was advised patient was last seen by Dr. Caryl Never in August of 2012, so the Rx could not be verified. She is requesting to speak with the provider, to see if there are any problems that she needs to be made aware of. She states patient will be rescheduled for his cleaning until she hears from the provider.

## 2011-07-11 NOTE — Telephone Encounter (Signed)
Better choice for dental prophylaxis would be Keflex 500 mg 4 tablets one hour prior to cleaning and NOT the Doxycycline.  First, we need to confirm how much Doxycycline he actually took.  Would ask him how MANY tablets he took.

## 2011-07-11 NOTE — Telephone Encounter (Signed)
Call placed to patient at [phone], no answer. A voice message was left for patient to return phone call regarding dental /medication .

## 2011-07-14 NOTE — Telephone Encounter (Signed)
Never heard back from pt. I left another message asking him to call back if his questions and concerns have not been answered

## 2011-08-14 ENCOUNTER — Other Ambulatory Visit: Payer: Self-pay | Admitting: Family Medicine

## 2011-09-05 ENCOUNTER — Ambulatory Visit (INDEPENDENT_AMBULATORY_CARE_PROVIDER_SITE_OTHER): Payer: BC Managed Care – PPO | Admitting: *Deleted

## 2011-09-05 DIAGNOSIS — G459 Transient cerebral ischemic attack, unspecified: Secondary | ICD-10-CM

## 2011-09-05 DIAGNOSIS — Z7901 Long term (current) use of anticoagulants: Secondary | ICD-10-CM

## 2011-09-05 DIAGNOSIS — Q231 Congenital insufficiency of aortic valve: Secondary | ICD-10-CM

## 2011-09-05 MED ORDER — WARFARIN SODIUM 5 MG PO TABS: ORAL_TABLET | ORAL | Status: DC

## 2011-10-18 ENCOUNTER — Ambulatory Visit (INDEPENDENT_AMBULATORY_CARE_PROVIDER_SITE_OTHER): Payer: BC Managed Care – PPO | Admitting: Pharmacist

## 2011-10-18 DIAGNOSIS — Z7901 Long term (current) use of anticoagulants: Secondary | ICD-10-CM

## 2011-10-18 DIAGNOSIS — G459 Transient cerebral ischemic attack, unspecified: Secondary | ICD-10-CM

## 2011-10-18 DIAGNOSIS — Q231 Congenital insufficiency of aortic valve: Secondary | ICD-10-CM

## 2011-10-18 MED ORDER — WARFARIN SODIUM 5 MG PO TABS: ORAL_TABLET | ORAL | Status: DC

## 2011-12-06 ENCOUNTER — Ambulatory Visit (INDEPENDENT_AMBULATORY_CARE_PROVIDER_SITE_OTHER): Payer: BC Managed Care – PPO | Admitting: *Deleted

## 2011-12-06 DIAGNOSIS — Z7901 Long term (current) use of anticoagulants: Secondary | ICD-10-CM

## 2011-12-06 DIAGNOSIS — Q231 Congenital insufficiency of aortic valve: Secondary | ICD-10-CM

## 2011-12-06 DIAGNOSIS — G459 Transient cerebral ischemic attack, unspecified: Secondary | ICD-10-CM

## 2011-12-06 LAB — POCT INR: INR: 1.7

## 2011-12-06 MED ORDER — WARFARIN SODIUM 5 MG PO TABS: ORAL_TABLET | ORAL | Status: DC

## 2012-02-14 ENCOUNTER — Other Ambulatory Visit: Payer: Self-pay | Admitting: Family Medicine

## 2012-02-15 ENCOUNTER — Other Ambulatory Visit: Payer: Self-pay | Admitting: Family Medicine

## 2012-03-05 ENCOUNTER — Ambulatory Visit (INDEPENDENT_AMBULATORY_CARE_PROVIDER_SITE_OTHER): Payer: BC Managed Care – PPO

## 2012-03-05 ENCOUNTER — Other Ambulatory Visit: Payer: Self-pay | Admitting: Internal Medicine

## 2012-03-05 DIAGNOSIS — Z7901 Long term (current) use of anticoagulants: Secondary | ICD-10-CM

## 2012-03-05 DIAGNOSIS — G459 Transient cerebral ischemic attack, unspecified: Secondary | ICD-10-CM

## 2012-03-05 DIAGNOSIS — Q231 Congenital insufficiency of aortic valve: Secondary | ICD-10-CM

## 2012-03-05 LAB — POCT INR: INR: 2.5

## 2012-04-17 ENCOUNTER — Other Ambulatory Visit: Payer: Self-pay | Admitting: Family Medicine

## 2012-05-10 ENCOUNTER — Other Ambulatory Visit: Payer: Self-pay | Admitting: Family Medicine

## 2012-05-13 ENCOUNTER — Telehealth: Payer: Self-pay | Admitting: Family Medicine

## 2012-05-13 MED ORDER — LEVOTHYROXINE SODIUM 112 MCG PO TABS: 112.0000 ug | ORAL_TABLET | Freq: Every day | ORAL | Status: DC

## 2012-05-13 NOTE — Telephone Encounter (Addendum)
Pt sch cpx for 06/2412 and has fasting cpx labs on 06/28/12 and is req to get refill of levothyroxine (SYNTHROID, LEVOTHROID) 112 MCG tablet to last until appt date. Pls call in to [facility] on [address].

## 2012-05-17 ENCOUNTER — Telehealth: Payer: Self-pay | Admitting: Family Medicine

## 2012-05-17 NOTE — Telephone Encounter (Signed)
Patient Information:  Caller Name: [name]  Phone: [phone]  Patient: Samuel [name], Samuel [name]  Gender: Male  DOB: 1951-04-11  Age: 61 Years  PCP: [name] (Family Practice)   Symptoms  Reason For Call & Symptoms: Patient complains of neck pain. Onset 1 month. intermittent and increasing in nature.  Location back of neck, down left shoulder ,left arm and into left thumb and index finger will tingle.  Cannot take Ibuprofen due to Coumadin.  Reviewed Health History In EMR: Yes  Reviewed Medications In EMR: Yes  Reviewed Allergies In EMR: Yes  Reviewed Surgeries / Procedures: No  Date of Onset of Symptoms: 04/17/2012  Treatments Tried: Treatment at home "dealing with it".  heat and tylenol  Treatments Tried Worked: No  Guideline(s) Used:  Neck Pain or Stiffness  Disposition Per Guideline:   See Within 3 Days in Office  Reason For Disposition Reached:   Moderate neck pain (e.g., interferes with normal activities like work or school)  Advice Given:  Reassurance:  Prolonged turning of the head or working in an awkward position can cause muscle pain in the back of the neck. With treatment, the pain usually resolves in 1 to 2 weeks.  Apply Cold to the Area Or Heat:   During the first 2 days after a mild injury, apply a cold pack or an ice bag (wrapped in a towel) for 20 minutes four times a day. After 2 days, apply a heating pad or hot water bottle to the most painful area for 20 minutes whenever the pain flares up. Wrap hot water bottles or heating pads in a towel to avoid burns.  Sleep:  Sleep on your back or side, not on your abdomen.  Pain Medicines:  For pain relief, you can take either acetaminophen, ibuprofen, or naproxen.  They are over-the-counter (OTC) pain drugs. You can buy them at the drugstore.  Good Body Mechanics:  Sleeping: Sleep on a firm mattress.  Sitting: Avoid sitting for long periods of time without a break. Avoid slouching. Place a pillow or towel behind your lower  back for support.  Computer screen: place at eye level.  Posture: Maintain good posture.  Call Back If:  Numbness or weakness occurs  Bowel or bladder problems occur  Pain lasts for more than 2 weeks  You become worse.  Office Follow Up:  Does the office need to follow up with this patient?: No  Instructions For The Office:Patient declined appt today. Cannot make last slot. Declined [facility] office on Saturday.   Appointment Scheduled:  05/20/2012 00:00:00 Appointment Scheduled Provider:  [name] [facility])

## 2012-05-20 ENCOUNTER — Encounter: Payer: Self-pay | Admitting: Family Medicine

## 2012-05-20 ENCOUNTER — Ambulatory Visit (INDEPENDENT_AMBULATORY_CARE_PROVIDER_SITE_OTHER): Payer: BC Managed Care – PPO | Admitting: Family Medicine

## 2012-05-20 VITALS — BP 140/90 | Temp 98.5°F | Wt 242.0 lb

## 2012-05-20 DIAGNOSIS — M5412 Radiculopathy, cervical region: Secondary | ICD-10-CM

## 2012-05-20 MED ORDER — CYCLOBENZAPRINE HCL 10 MG PO TABS: 10.0000 mg | ORAL_TABLET | Freq: Three times a day (TID) | ORAL | Status: DC | PRN

## 2012-05-20 MED ORDER — HYDROCODONE-ACETAMINOPHEN 5-325 MG PO TABS: ORAL_TABLET | ORAL | Status: DC

## 2012-05-20 MED ORDER — PREDNISONE 10 MG PO TABS: ORAL_TABLET | ORAL | Status: DC

## 2012-05-20 NOTE — Patient Instructions (Addendum)
Follow up immediately for any upper extremity weakness or increased pain.

## 2012-05-20 NOTE — Progress Notes (Signed)
  Subjective:    Patient ID: Samuel [name], male    DOB: 06/01/1951, 61 y.o.   MRN: [id number]  HPI  Patient seen with cervical neck pain for the past 3 weeks. No injury. He describes a sharp pain which radiates down occasionally to the left thumb and index finger. He has pain which is worse with lateral bending or rotating to the left side. Very intense at times. No associated weakness. Tried Tylenol without relief. Starting to impair sleep. He went to chiropractor one visit but no x-rays done yet. No alleviating factors  He has chronic problems including history of aortic valve replacement, hypothyroidism, and chronic Coumadin use. No recent bleeding complications. Coumadin has been monitored closely.  Past Medical History  Diagnosis Date  . ONYCHOMYCOSIS 11/02/2008  . HYPOTHYROIDISM 08/26/2008  . HYPERLIPIDEMIA-MIXED 04/01/2009  . Sudden visual loss 12/25/2008  . TRANSIENT ISCHEMIC ATTACK 01/25/2009  . HEMORRHOIDS-INTERNAL 12/20/2009  . KNEE PAIN 11/02/2008  . LATERAL EPICONDYLITIS 06/02/2009  . 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 INSOMNIA, TRANSIENT 04/27/2009  . PERSONAL HX COLONIC POLYPS 12/20/2009  . Migraines   . Aortic aneurysm   . Dyslipidemia   . Kidney stones   . Hemorrhoids    Past Surgical History  Procedure Date  . S/p avr     with aortic root replacement 02/2009  . Cystectomy     from abdominal wall  . Tonsillectomy     reports that he has quit smoking. He does not have any smokeless tobacco history on file. He reports that he drinks about 8.4 ounces of alcohol per week. He reports that he does not use illicit drugs. family history includes Diabetes in his mother and Heart disease in his father. Allergies  Allergen Reactions  . Penicillins     REACTION: childhood, reaction unknown      Review of Systems  Constitutional: Negative for fever, appetite change and unexpected weight change.  HENT: Positive for neck pain.   Respiratory: Negative for shortness of breath.    Cardiovascular: Negative for chest pain.  Neurological: Positive for numbness. Negative for weakness.  Hematological: Negative for adenopathy. Does not bruise/bleed easily.       Objective:   Physical Exam  Constitutional: He appears well-developed and well-nourished.  Cardiovascular: Normal rate.        Patient has click from aortic valve and regular rhythm  Pulmonary/Chest: Effort normal and breath sounds normal. No respiratory distress. He has no wheezes. He has no rales.  Musculoskeletal:       Patient has good range of motion with the exception of  has limited lateral bending or rotation to the left secondary to pain. He has nonspecific trapezius tenderness on left side. No evidence for muscle atrophy left upper extremity  Lymphadenopathy:    He has no cervical adenopathy.  Neurological:       Deep tender reflexes are symmetric upper extremities. No focal weakness.          Assessment & Plan:  Left cervical radiculopathy in C6 distribution. No focal weakness. Prednisone taper. Limited hydrocodone 5/325 mg one to 2 every 6 hours as needed #30 with no refill. Flexeril 10 mg each bedtime as needed. Touch base 2-3 weeks if no better and sooner as needed. May eventually need MRI to further assess

## 2012-06-03 ENCOUNTER — Other Ambulatory Visit: Payer: Self-pay | Admitting: Family Medicine

## 2012-06-04 ENCOUNTER — Telehealth: Payer: Self-pay | Admitting: Family Medicine

## 2012-06-04 MED ORDER — HYDROCODONE-ACETAMINOPHEN 10-650 MG PO TABS: 1.0000 | ORAL_TABLET | Freq: Four times a day (QID) | ORAL | Status: DC | PRN

## 2012-06-04 NOTE — Telephone Encounter (Signed)
Pt needs follow up labs (TSH). May refill prednisone once.  Refill thyroid med for 2 months but must check TSH before additional refills.

## 2012-06-04 NOTE — Telephone Encounter (Signed)
Pt called requesting something for pain, "Hydrocodone does not help much".

## 2012-06-04 NOTE — Telephone Encounter (Signed)
Prednisone refill request, was given at [facility] 12/9.  Please advise

## 2012-06-04 NOTE — Telephone Encounter (Signed)
Let's call in Lorcet 10/325 mg one every 6 hours prn pain, disp #30 with no refill .  We will need to consider MRI C spine if not improving with this go round of prednisone.

## 2012-06-04 NOTE — Telephone Encounter (Signed)
[facility] Triage Call Report Triage Record Num: [id number] Operator: Griselda Miner Patient Name: Samuel [name] Call Date & Time: 06/03/2012 6:09:07PM Patient Phone: [phone] PCP: [name] Patient Gender: Male PCP Fax : [phone] Patient DOB: 03-29-1951 [facility] Name: [facility]  Reason for Call: Caller: [name]/Patient; PCP: [name] (Family Practice); CB#: [phone]; Call regarding Back Pain; Onset about 1 month ago. Woke one morning to pain in neck-coulf not turn neck to right or left. Over time it has begun to radiate into chest and down left arm. Fingers were numb and tingling-now hand and forearm is as well, but it is p may help. Hydrocodone helps him to sleep. Denies all emergent symptoms. Triaged using Back Symptoms with a disposition to see provider within 72 hours due to evaluated by provider and no improvement in symptoms after following treatment plan. Care advice given. Caller demonstrated understanding. Patient is looking to try dose pack for a second time. Pharmacy has faxed refill request to office.  Protocol(s) Used: Back Symptoms Recommended Outcome per Protocol: See Provider within 72 Hours Reason for Outcome: Evaluated by provider AND no improvement in symptoms after following treatment plan for the time specified by provider

## 2012-06-07 ENCOUNTER — Other Ambulatory Visit: Payer: Self-pay | Admitting: Family Medicine

## 2012-06-07 NOTE — Telephone Encounter (Signed)
We did 10 mg Lorcet on 06-04-12.  Please clarify.  Did he not get 10mg  Lorcet?

## 2012-06-14 ENCOUNTER — Ambulatory Visit: Payer: BC Managed Care – PPO | Admitting: Family Medicine

## 2012-06-21 ENCOUNTER — Other Ambulatory Visit (INDEPENDENT_AMBULATORY_CARE_PROVIDER_SITE_OTHER): Payer: BC Managed Care – PPO

## 2012-06-21 DIAGNOSIS — Z0000 Encounter for general adult medical examination without abnormal findings: Secondary | ICD-10-CM

## 2012-06-21 LAB — POCT URINALYSIS DIPSTICK
Spec Grav, UA: >=1.03
pH, UA: 5.5

## 2012-06-21 LAB — CBC WITH DIFFERENTIAL/PLATELET
Basophils Relative: 0.6 % (ref 0.0–3.0)
Eosinophils Absolute: 0 10*3/uL (ref 0.0–0.7)
HCT: 46.2 % (ref 39.0–52.0)
Hemoglobin: 16 g/dL (ref 13.0–17.0)
Lymphocytes Relative: 25.1 % (ref 12.0–46.0)
Lymphs Abs: 1.3 10*3/uL (ref 0.7–4.0)
MCHC: 34.7 g/dL (ref 30.0–36.0)
Monocytes Relative: 12.2 % — ABNORMAL HIGH (ref 3.0–12.0)
Neutro Abs: 3.2 10*3/uL (ref 1.4–7.7)
RBC: 5.04 Mil/uL (ref 4.22–5.81)

## 2012-06-21 LAB — HEPATIC FUNCTION PANEL
AST: 35 U/L (ref 0–37)
Albumin: 4.5 g/dL (ref 3.5–5.2)
Alkaline Phosphatase: 50 U/L (ref 39–117)
Total Protein: 7.1 g/dL (ref 6.0–8.3)

## 2012-06-21 LAB — BASIC METABOLIC PANEL
CO2: 30 mEq/L (ref 19–32)
Calcium: 9.5 mg/dL (ref 8.4–10.5)
Potassium: 4.8 mEq/L (ref 3.5–5.1)
Sodium: 140 mEq/L (ref 135–145)

## 2012-06-21 LAB — LIPID PANEL
Total CHOL/HDL Ratio: 5
VLDL: 17 mg/dL (ref 0.0–40.0)

## 2012-06-21 LAB — LDL CHOLESTEROL, DIRECT: Direct LDL: 137.9 mg/dL

## 2012-06-28 ENCOUNTER — Other Ambulatory Visit: Payer: BC Managed Care – PPO

## 2012-07-01 ENCOUNTER — Other Ambulatory Visit: Payer: Self-pay | Admitting: Family Medicine

## 2012-07-05 ENCOUNTER — Encounter: Payer: Self-pay | Admitting: Family Medicine

## 2012-07-05 ENCOUNTER — Ambulatory Visit (INDEPENDENT_AMBULATORY_CARE_PROVIDER_SITE_OTHER): Payer: BC Managed Care – PPO | Admitting: Family Medicine

## 2012-07-05 VITALS — BP 126/86 | HR 72 | Temp 99.0°F | Resp 12 | Ht 73.25 in | Wt 238.0 lb

## 2012-07-05 DIAGNOSIS — M542 Cervicalgia: Secondary | ICD-10-CM

## 2012-07-05 DIAGNOSIS — Z0000 Encounter for general adult medical examination without abnormal findings: Secondary | ICD-10-CM

## 2012-07-05 DIAGNOSIS — R319 Hematuria, unspecified: Secondary | ICD-10-CM

## 2012-07-05 LAB — POCT URINALYSIS DIPSTICK
Glucose, UA: NEGATIVE
Ketones, UA: NEGATIVE
Spec Grav, UA: 1.015

## 2012-07-05 NOTE — Patient Instructions (Addendum)
Get INR/protime is checked regularly as instructed per Coumadin clinic Check on insurance coverage for shingles vaccine Try to lose some weight and establish more consistent exercise

## 2012-07-05 NOTE — Progress Notes (Signed)
Subjective:    Patient ID: Samuel [name], male    DOB: January 03, 1951, 62 y.o.   MRN: [id number]  HPI Patient here for complete physical. He had aortic valve replacement back in 2010. Has been generally well since that time. History of hypothyroidism.   Has had some persistent neck pains. Refer to prior note. Patient remains on Coumadin. No bleeding complications. Followed by Coumadin clinic at cardiology. Has not had protime since September of 2013.  Colonoscopy 2005. Last tetanus estimated less than 10 years ago. Patient has no history of shingles vaccine. Prior history of kidney stones and has had some hematuria previously but no gross hematuria.  Intermittent left upper extremity numbness. Left base neck pain- with minimal pain. No left arm pain. Symptoms are positional. Numbness is worse when he bends his neck to the left side and relieved when he bends to the right side. No history of any recent x-rays. No recent injury.  Past Medical History  Diagnosis Date  . ONYCHOMYCOSIS 11/02/2008  . HYPOTHYROIDISM 08/26/2008  . HYPERLIPIDEMIA-MIXED 04/01/2009  . Sudden visual loss 12/25/2008  . TRANSIENT ISCHEMIC ATTACK 01/25/2009  . HEMORRHOIDS-INTERNAL 12/20/2009  . KNEE PAIN 11/02/2008  . LATERAL EPICONDYLITIS 06/02/2009  . 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 INSOMNIA, TRANSIENT 04/27/2009  . PERSONAL HX COLONIC POLYPS 12/20/2009  . Migraines   . Aortic aneurysm   . Dyslipidemia   . Kidney stones   . Hemorrhoids    Past Surgical History  Procedure Date  . S/p avr     with aortic root replacement 02/2009  . Cystectomy     from abdominal wall  . Tonsillectomy     reports that he has quit smoking. He does not have any smokeless tobacco history on file. He reports that he drinks about 8.4 ounces of alcohol per week. He reports that he does not use illicit drugs. family history includes Diabetes in his mother and Heart disease in his father. Allergies  Allergen Reactions  . Penicillins    REACTION: childhood, reaction unknown      Review of Systems  Constitutional: Negative for fever, activity change, appetite change and fatigue.  HENT: Positive for neck pain. Negative for ear pain, congestion and trouble swallowing.   Eyes: Negative for pain and visual disturbance.  Respiratory: Negative for cough, shortness of breath and wheezing.   Cardiovascular: Negative for chest pain and palpitations.  Gastrointestinal: Negative for nausea, vomiting, abdominal pain, diarrhea, constipation, blood in stool, abdominal distention and rectal pain.  Genitourinary: Negative for dysuria, hematuria and testicular pain.  Musculoskeletal: Negative for joint swelling and arthralgias.  Skin: Negative for rash.  Neurological: Positive for numbness (As per history of present illness). Negative for dizziness, syncope and headaches.  Hematological: Negative for adenopathy.  Psychiatric/Behavioral: Negative for confusion and dysphoric mood.       Objective:   Physical Exam  Constitutional: He is oriented to person, place, and time. He appears well-developed and well-nourished. No distress.  HENT:  Head: Normocephalic and atraumatic.  Right Ear: External ear normal.  Left Ear: External ear normal.  Mouth/Throat: Oropharynx is clear and moist.  Eyes: Conjunctivae normal and EOM are normal. Pupils are equal, round, and reactive to light.  Neck: Normal range of motion. Neck supple. No thyromegaly present.  Cardiovascular: Normal rate, regular rhythm and normal heart sounds.   No murmur heard. Pulmonary/Chest: No respiratory distress. He has no wheezes. He has no rales.  Abdominal: Soft. Bowel sounds are normal. He exhibits no distension and  no mass. There is no tenderness. There is no rebound and no guarding.  Musculoskeletal: He exhibits no edema.  Lymphadenopathy:    He has no cervical adenopathy.  Neurological: He is alert and oriented to person, place, and time. He displays normal reflexes.  No cranial nerve deficit.       Strength full upper extremities. Symmetric reflexes.  Skin: No rash noted.  Psychiatric: He has a normal mood and affect.          Assessment & Plan:  Complete physical. Labs reviewed. Prediabetes with glucose 120. Diastolic blood pressure borderline elevated. Work on weight loss. Reduce sugars and starches. Repeat urine dipstick with blood noted initially. Obtain cervical spine film to further evaluate left neck pain with left upper extremity radiculopathy symptoms. Strongly emphasized patient to get INR checked regularly. Check coverage for shingles vaccine.

## 2012-07-06 LAB — URINALYSIS, MICROSCOPIC ONLY
Crystals: NONE SEEN
Squamous Epithelial / LPF: NONE SEEN

## 2012-07-09 NOTE — Progress Notes (Signed)
Quick Note:  Pt informed ______ 

## 2012-07-11 ENCOUNTER — Telehealth: Payer: Self-pay | Admitting: Family Medicine

## 2012-07-11 DIAGNOSIS — M542 Cervicalgia: Secondary | ICD-10-CM

## 2012-07-11 NOTE — Telephone Encounter (Signed)
Pt has order for neck xray. Pt is still having back pain and requesting back xray also.

## 2012-07-11 NOTE — Telephone Encounter (Signed)
His symptoms were cervical distribution so cervical spine films indicated based on symptoms he gave.

## 2012-07-12 NOTE — Telephone Encounter (Signed)
Pt informed.  Pt reports he has had low back pain for years, and more recently has had a flare-up, questioning sciatica.  As long as he was having x-rays done, thought he could have both?

## 2012-07-12 NOTE — Telephone Encounter (Signed)
Pt informed

## 2012-07-12 NOTE — Telephone Encounter (Signed)
Go ahead and get lumbosacral films-in addition to cervical spine films

## 2012-07-17 ENCOUNTER — Ambulatory Visit (INDEPENDENT_AMBULATORY_CARE_PROVIDER_SITE_OTHER)
Admission: RE | Admit: 2012-07-17 | Discharge: 2012-07-17 | Disposition: A | Discharge status: Home / Self Care | Payer: BC Managed Care – PPO | Source: Ambulatory Visit | Attending: Family Medicine | Admitting: Family Medicine

## 2012-07-17 DIAGNOSIS — M542 Cervicalgia: Secondary | ICD-10-CM

## 2012-07-19 ENCOUNTER — Encounter: Payer: BC Managed Care – PPO | Admitting: Family Medicine

## 2012-07-19 ENCOUNTER — Other Ambulatory Visit: Payer: Self-pay | Admitting: Family Medicine

## 2012-07-19 NOTE — Progress Notes (Signed)
No show  This encounter was created in error - please disregard.

## 2012-07-24 ENCOUNTER — Telehealth: Payer: Self-pay | Admitting: Family Medicine

## 2012-07-24 NOTE — Telephone Encounter (Signed)
Please advise 

## 2012-07-24 NOTE — Telephone Encounter (Signed)
Pt requesting x-ray results and whether he needs to be referred to speacialist.  He originally had appt for 07/25/12.  However due to inclement weather unable to come in.  Would rather receive call regarding results instead of rescheduling appt.

## 2012-07-25 ENCOUNTER — Ambulatory Visit: Payer: BC Managed Care – PPO | Admitting: Family Medicine

## 2012-07-25 NOTE — Telephone Encounter (Signed)
Pt has some nonspecific degenerative (arthritic) changes C5-C6.  We can refer to neurosurgeon but they will be unable to make any specific further recommendations without MRI to further evaluate.  If he is having ongoing severe neck pain with radiation down arm with associated numbness and/or weakness would get Cervical MRI as next step.

## 2012-07-29 NOTE — Telephone Encounter (Signed)
Pt informed, he reports being in a lot of pain however he is concerned about the cost of the MRI.  He also had a lumbar spine x-ray done also due to terrible low back pain.  Pt requesting report from the lumbar x-ray also.

## 2012-07-29 NOTE — Telephone Encounter (Signed)
Mild arthritic change at L1-2 otherwise normal.

## 2012-07-30 NOTE — Telephone Encounter (Signed)
Pt informed on VM, I asked him to call back if he wants the cervical spine MRI ordered, he was concerned about the cost?

## 2012-08-13 ENCOUNTER — Other Ambulatory Visit: Payer: Self-pay | Admitting: Family Medicine

## 2012-09-06 ENCOUNTER — Encounter: Payer: Self-pay | Admitting: Pharmacist

## 2012-09-20 ENCOUNTER — Ambulatory Visit (INDEPENDENT_AMBULATORY_CARE_PROVIDER_SITE_OTHER): Payer: BC Managed Care – PPO | Admitting: Pharmacist

## 2012-09-20 DIAGNOSIS — Q231 Congenital insufficiency of aortic valve: Secondary | ICD-10-CM

## 2012-09-20 DIAGNOSIS — G459 Transient cerebral ischemic attack, unspecified: Secondary | ICD-10-CM

## 2012-09-20 DIAGNOSIS — Z7901 Long term (current) use of anticoagulants: Secondary | ICD-10-CM

## 2012-09-20 LAB — POCT INR: INR: 2.1

## 2012-09-20 MED ORDER — WARFARIN SODIUM 5 MG PO TABS: 5.0000 mg | ORAL_TABLET | ORAL | Status: DC

## 2013-01-01 ENCOUNTER — Encounter: Payer: Self-pay | Admitting: Family Medicine

## 2013-01-01 ENCOUNTER — Ambulatory Visit (INDEPENDENT_AMBULATORY_CARE_PROVIDER_SITE_OTHER): Payer: BC Managed Care – PPO | Admitting: Family Medicine

## 2013-01-01 ENCOUNTER — Ambulatory Visit (INDEPENDENT_AMBULATORY_CARE_PROVIDER_SITE_OTHER): Payer: BC Managed Care – PPO | Admitting: Family

## 2013-01-01 VITALS — BP 126/80 | HR 60 | Temp 98.4°F | Wt 238.0 lb

## 2013-01-01 DIAGNOSIS — Z7901 Long term (current) use of anticoagulants: Secondary | ICD-10-CM

## 2013-01-01 DIAGNOSIS — Q231 Congenital insufficiency of aortic valve: Secondary | ICD-10-CM

## 2013-01-01 DIAGNOSIS — G459 Transient cerebral ischemic attack, unspecified: Secondary | ICD-10-CM

## 2013-01-01 MED ORDER — WARFARIN SODIUM 5 MG PO TABS: 5.0000 mg | ORAL_TABLET | ORAL | Status: DC

## 2013-01-01 MED ORDER — HYDROCODONE-ACETAMINOPHEN 10-325 MG PO TABS: 1.0000 | ORAL_TABLET | Freq: Three times a day (TID) | ORAL | Status: DC | PRN

## 2013-01-01 NOTE — Patient Instructions (Addendum)
Take extra 1/2 tab today only then Continue same dose of 1 1/2 tablets every day except 2 tablets on Sunday, Tuesday and Thursday.  Recheck INR in 4 weeks.   Anticoagulation Dose Instructions as of 01/01/2013     Glynis Smiles Tue Wed Thu Fri Sat   New Dose 10 mg 7.5 mg 10 mg 7.5 mg 10 mg 7.5 mg 7.5 mg    Description       Take extra 1/2 tab today only then Continue same dose of 1 1/2 tablets every day except 2 tablets on Sunday, Tuesday and Thursday.  Recheck INR in 4 weeks.

## 2013-01-01 NOTE — Progress Notes (Signed)
  Subjective:    Patient ID: Samuel [name], male    DOB: [birth year], 62 y.o.   MRN: [id number]  HPI  Lumbar back pain. Onset Saturday after waking up. Sharp and achy.  Severity: 9/10 No radiation.  Radiates to left lumbar.  No radiculopathy symptoms Better standing. Worse changing positions. No numbness or weakness.  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 He takes Coumadin regularly. He has had poor compliance with getting INR regularly which he attributes to cost issues. Needs followup at this time. Also requesting refills Coumadin.  Past Medical History  Diagnosis Date  . ONYCHOMYCOSIS 11/02/2008  . HYPOTHYROIDISM 08/26/2008  . HYPERLIPIDEMIA-MIXED 04/01/2009  . Sudden visual loss 12/25/2008  . TRANSIENT ISCHEMIC ATTACK 01/25/2009  . HEMORRHOIDS-INTERNAL 12/20/2009  . KNEE PAIN 11/02/2008  . LATERAL EPICONDYLITIS 06/02/2009  . 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 INSOMNIA, TRANSIENT 04/27/2009  . PERSONAL HX COLONIC POLYPS 12/20/2009  . Migraines   . Aortic aneurysm   . Dyslipidemia   . Kidney stones   . Hemorrhoids    Past Surgical History  Procedure Laterality Date  . S/p avr      with aortic root replacement 02/2009  . Cystectomy      from abdominal wall  . Tonsillectomy      reports that he has quit smoking. He does not have any smokeless tobacco history on file. He reports that he drinks about 8.4 ounces of alcohol per week. He reports that he does not use illicit drugs. family history includes Diabetes in his mother and Heart disease in his father. Allergies  Allergen Reactions  . Penicillins     REACTION: childhood, reaction unknown     Review of Systems  Constitutional: Negative for fever, activity change and appetite change.  Respiratory: Negative for cough and shortness of breath.   Cardiovascular: Negative for chest pain and leg swelling.  Gastrointestinal: Negative for vomiting and abdominal pain.  Genitourinary:  Negative for dysuria, hematuria and flank pain.  Musculoskeletal: Positive for back pain. Negative for joint swelling.  Neurological: Negative for weakness and numbness.       Objective:   Physical Exam  Constitutional: He appears well-developed and well-nourished.  Cardiovascular: Normal rate.   Pulmonary/Chest: Effort normal and breath sounds normal. No respiratory distress. He has no wheezes. He has no rales.  Musculoskeletal: He exhibits no edema.  Straight leg raises are negative. No specific lumbar tenderness  Neurological:  Full-strength lower extremities. Symmetric reflexes.          Assessment & Plan:  #1 acute lumbar back pain. Nonfocal exam. Avoid nonsteroidals with Coumadin. Rover Limited hydrocodone. Consider physical therapy if not improving soon #2 history of aortic valve replacement. Continue Coumadin. Check INR today. Refill Coumadin for 3 months and stressed importance of regular INR assessments

## 2013-01-01 NOTE — Patient Instructions (Addendum)

## 2013-01-06 ENCOUNTER — Telehealth: Payer: Self-pay | Admitting: Family Medicine

## 2013-01-06 NOTE — Telephone Encounter (Signed)
Pt has lost his coumadin 5 mg. Pt call into pharm new rx coumadin 5mg  #156  For 90 day supply call [facility] [address]

## 2013-01-07 NOTE — Telephone Encounter (Signed)
Call in for 90 day refill but no additional refills.

## 2013-05-15 ENCOUNTER — Ambulatory Visit (INDEPENDENT_AMBULATORY_CARE_PROVIDER_SITE_OTHER): Payer: BC Managed Care – PPO | Admitting: Family Medicine

## 2013-05-15 ENCOUNTER — Encounter: Payer: Self-pay | Admitting: Family Medicine

## 2013-05-15 VITALS — BP 130/82 | HR 72 | Temp 99.6°F | Wt 238.0 lb

## 2013-05-15 DIAGNOSIS — J209 Acute bronchitis, unspecified: Secondary | ICD-10-CM

## 2013-05-15 MED ORDER — HYDROCOD POLST-CHLORPHEN POLST 10-8 MG/5ML PO LQCR: 5.0000 mL | Freq: Two times a day (BID) | ORAL | Status: DC | PRN

## 2013-05-15 MED ORDER — DOXYCYCLINE HYCLATE 100 MG PO CAPS: 100.0000 mg | ORAL_CAPSULE | Freq: Two times a day (BID) | ORAL | Status: AC

## 2013-05-15 NOTE — Progress Notes (Signed)
   Subjective:    Patient ID: Samuel [name], male    DOB: March 02, 1951, 62 y.o.   MRN: [id number]  HPI Here for 3 days of chest tightness and coughing up green sputum. Low grade fevers. On Robitussin.    Review of Systems  Constitutional: Positive for fever.  HENT: Positive for congestion and postnasal drip. Negative for sinus pressure.   Eyes: Negative.   Respiratory: Positive for cough and chest tightness.        Objective:   Physical Exam  Constitutional: He appears well-developed and well-nourished. No distress.  HENT:  Right Ear: External ear normal.  Left Ear: External ear normal.  Nose: Nose normal.  Mouth/Throat: Oropharynx is clear and moist.  Eyes: Conjunctivae are normal.  Pulmonary/Chest: Effort normal and breath sounds normal.  Lymphadenopathy:    He has no cervical adenopathy.          Assessment & Plan:  Drink fluids.

## 2013-06-13 ENCOUNTER — Encounter: Payer: Self-pay | Admitting: Family Medicine

## 2013-06-13 ENCOUNTER — Ambulatory Visit (INDEPENDENT_AMBULATORY_CARE_PROVIDER_SITE_OTHER): Payer: BC Managed Care – PPO | Admitting: Family Medicine

## 2013-06-13 VITALS — BP 146/106 | HR 70 | Temp 98.0°F | Resp 18 | Ht 73.25 in | Wt 242.0 lb

## 2013-06-13 DIAGNOSIS — H6121 Impacted cerumen, right ear: Secondary | ICD-10-CM

## 2013-06-13 DIAGNOSIS — H6120 Impacted cerumen, unspecified ear: Secondary | ICD-10-CM

## 2013-06-13 DIAGNOSIS — R030 Elevated blood-pressure reading, without diagnosis of hypertension: Secondary | ICD-10-CM

## 2013-06-13 NOTE — Progress Notes (Signed)
OFFICE NOTE  06/13/2013  CC:  Chief Complaint  Patient presents with  . Ear Fullness     HPI: Patient is a 63 y.o. Caucasian male who is here for ear fullness. Says he put a Q tip in right ear and pushed wax back and now it feels full. Left ear with chronic eustachian tube dysfunx sx's unchanged per pt.  He has had a tympanostomy tube in left TM in the past.  Pertinent PMH:  Past Medical History  Diagnosis Date  . ONYCHOMYCOSIS 11/02/2008  . HYPOTHYROIDISM 08/26/2008  . HYPERLIPIDEMIA-MIXED 04/01/2009  . Sudden visual loss 12/25/2008  . TRANSIENT ISCHEMIC ATTACK 01/25/2009  . HEMORRHOIDS-INTERNAL 12/20/2009  . KNEE PAIN 11/02/2008  . LATERAL EPICONDYLITIS 06/02/2009  . 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 INSOMNIA, TRANSIENT 04/27/2009  . PERSONAL HX COLONIC POLYPS 12/20/2009  . Migraines   . Aortic aneurysm   . Dyslipidemia   . Kidney stones   . Hemorrhoids    Past surgical, social, and family history reviewed and no changes noted since last office visit.  MEDS:  Synthroid 112 mcg qd, tylenol 650mg  q6h prn PE: Blood pressure 146/106, pulse 70, temperature 98 F (36.7 C), temperature source Temporal, resp. rate 18, height 6' 1.25" (1.861 m), weight 242 lb (109.77 kg), SpO2 97.00%. Gen: Alert, well appearing.  Patient is oriented to person, place, time, and situation. Right EAC with complete cerumen impaction--I removed this with my curette today.  EAC walls appeared wnl after this, TM appeared normal. Left EAC clear.  Left TM with retracted appearance, with a tiny hole that appears chronic.  No tympanostomy tube present.  No drainage or middle ear fluid is seen.  No TM erythema. CV: RRR, no m/r/g.   LUNGS: CTA bilat, nonlabored resps, good aeration in all lung fields.  IMPRESSION AND PLAN:  Cerumen impaction Removed completely today with curette by myself.  Elevated blood pressure reading without diagnosis of hypertension Encouraged pt to monitor bp periodically over the  next 5d. Reviewed normal bp goals with pt.  An After Visit Summary was printed and given to the patient.  FOLLOW UP: prn

## 2013-06-13 NOTE — Patient Instructions (Signed)
Check your Blood pressure once daily for the next 5d. Average top number should be <140 and avg bottom number should be <90.

## 2013-06-13 NOTE — Progress Notes (Signed)
Pre visit review using our clinic review tool, if applicable. No additional management support is needed unless otherwise documented below in the visit note. 

## 2013-06-16 ENCOUNTER — Ambulatory Visit (INDEPENDENT_AMBULATORY_CARE_PROVIDER_SITE_OTHER): Payer: BC Managed Care – PPO | Admitting: Pharmacist

## 2013-06-16 DIAGNOSIS — Z7901 Long term (current) use of anticoagulants: Secondary | ICD-10-CM

## 2013-06-16 DIAGNOSIS — G459 Transient cerebral ischemic attack, unspecified: Secondary | ICD-10-CM

## 2013-06-16 DIAGNOSIS — Q231 Congenital insufficiency of aortic valve: Secondary | ICD-10-CM

## 2013-06-16 LAB — POCT INR: INR: 2.9

## 2013-06-16 MED ORDER — WARFARIN SODIUM 5 MG PO TABS: 5.0000 mg | ORAL_TABLET | ORAL | Status: DC

## 2013-06-17 ENCOUNTER — Encounter: Payer: Self-pay | Admitting: Internal Medicine

## 2013-06-23 DIAGNOSIS — H6120 Impacted cerumen, unspecified ear: Secondary | ICD-10-CM | POA: Insufficient documentation

## 2013-06-23 DIAGNOSIS — R030 Elevated blood-pressure reading, without diagnosis of hypertension: Secondary | ICD-10-CM | POA: Insufficient documentation

## 2013-06-23 NOTE — Assessment & Plan Note (Signed)
Removed completely today with curette by myself.

## 2013-06-23 NOTE — Assessment & Plan Note (Signed)
Encouraged pt to monitor bp periodically over the next 5d. Reviewed normal bp goals with pt.

## 2013-09-26 ENCOUNTER — Telehealth: Payer: Self-pay | Admitting: Family Medicine

## 2013-09-26 MED ORDER — LEVOTHYROXINE SODIUM 112 MCG PO TABS: ORAL_TABLET | ORAL | Status: DC

## 2013-09-26 NOTE — Telephone Encounter (Signed)
Pt is needing new rx for levothyroxine (synthroid levothrod) 112 mcg, [facility], pt has appointment for physical 11/05/13, needs rx to last until appt. Pt has been out for a couple of weeks

## 2013-09-26 NOTE — Telephone Encounter (Signed)
[facility]

## 2013-09-29 ENCOUNTER — Ambulatory Visit: Payer: BC Managed Care – PPO

## 2013-09-30 ENCOUNTER — Telehealth: Payer: Self-pay | Admitting: *Deleted

## 2013-09-30 NOTE — Telephone Encounter (Signed)
Informed pt that had received refill request from [facility] to refill Coumadin and that he has not been seen in our clinic since January 2015 and pt states that he is  now  having [facility] checked at [facility] and informed to call us when has INR checked there so that we can discharge him from our clinic and he states understanding. This nurse called and spoke with Samara DeistKathryn at [facility] and she states he had an appt with them yesterday and did not show.Instructed to let us know when  he is being checked there and we will discharge him Also called [facility] and informed Rolly SalterHaley that he states he is going to have [facility] done at [facility] and they need to sendrequest for refill of coumadin to that office and she states understanding.

## 2013-10-02 ENCOUNTER — Other Ambulatory Visit: Payer: Self-pay | Admitting: General Practice

## 2013-10-02 ENCOUNTER — Ambulatory Visit (INDEPENDENT_AMBULATORY_CARE_PROVIDER_SITE_OTHER): Payer: BC Managed Care – PPO | Admitting: General Practice

## 2013-10-02 DIAGNOSIS — Z5181 Encounter for therapeutic drug level monitoring: Secondary | ICD-10-CM | POA: Insufficient documentation

## 2013-10-02 DIAGNOSIS — Q231 Congenital insufficiency of aortic valve: Secondary | ICD-10-CM

## 2013-10-02 DIAGNOSIS — G459 Transient cerebral ischemic attack, unspecified: Secondary | ICD-10-CM

## 2013-10-02 LAB — POCT INR: INR: 2.3

## 2013-10-02 MED ORDER — WARFARIN SODIUM 5 MG PO TABS: ORAL_TABLET | ORAL | Status: DC

## 2013-10-02 NOTE — Progress Notes (Signed)
Pre visit review using our clinic review tool, if applicable. No additional management support is needed unless otherwise documented below in the visit note. 

## 2013-10-21 ENCOUNTER — Other Ambulatory Visit: Payer: Self-pay | Admitting: Family Medicine

## 2013-10-29 ENCOUNTER — Other Ambulatory Visit (INDEPENDENT_AMBULATORY_CARE_PROVIDER_SITE_OTHER): Payer: BC Managed Care – PPO

## 2013-10-29 ENCOUNTER — Encounter (INDEPENDENT_AMBULATORY_CARE_PROVIDER_SITE_OTHER): Payer: Self-pay

## 2013-10-29 DIAGNOSIS — Z0000 Encounter for general adult medical examination without abnormal findings: Secondary | ICD-10-CM

## 2013-10-29 LAB — BASIC METABOLIC PANEL
BUN: 14 mg/dL (ref 6–23)
CO2: 30 mEq/L (ref 19–32)
CREATININE: 1.2 mg/dL (ref 0.4–1.5)
Calcium: 9.4 mg/dL (ref 8.4–10.5)
Chloride: 103 mEq/L (ref 96–112)
GFR: 67.54 mL/min (ref >60.00)
GLUCOSE: 97 mg/dL (ref 70–99)
POTASSIUM: 5.1 meq/L (ref 3.5–5.1)
Sodium: 139 mEq/L (ref 135–145)

## 2013-10-29 LAB — LIPID PANEL
CHOLESTEROL: 198 mg/dL (ref 0–200)
HDL: 47.3 mg/dL (ref >39.00)
LDL CALC: 137 mg/dL — AB (ref 0–99)
Total CHOL/HDL Ratio: 4
Triglycerides: 70 mg/dL (ref 0.0–149.0)
VLDL: 14 mg/dL (ref 0.0–40.0)

## 2013-10-29 LAB — CBC WITH DIFFERENTIAL/PLATELET
BASOS ABS: 0 10*3/uL (ref 0.0–0.1)
Basophils Relative: 0.3 % (ref 0.0–3.0)
EOS PCT: 0.8 % (ref 0.0–5.0)
Eosinophils Absolute: 0 10*3/uL (ref 0.0–0.7)
HEMATOCRIT: 46.6 % (ref 39.0–52.0)
Hemoglobin: 16.1 g/dL (ref 13.0–17.0)
LYMPHS ABS: 1.4 10*3/uL (ref 0.7–4.0)
Lymphocytes Relative: 31.9 % (ref 12.0–46.0)
MCHC: 34.5 g/dL (ref 30.0–36.0)
MCV: 93 fl (ref 78.0–100.0)
Monocytes Absolute: 0.5 10*3/uL (ref 0.1–1.0)
Monocytes Relative: 11.5 % (ref 3.0–12.0)
NEUTROS PCT: 55.5 % (ref 43.0–77.0)
Neutro Abs: 2.5 10*3/uL (ref 1.4–7.7)
PLATELETS: 195 10*3/uL (ref 150.0–400.0)
RBC: 5.01 Mil/uL (ref 4.22–5.81)
RDW: 13.2 % (ref 11.5–15.5)
WBC: 4.4 10*3/uL (ref 4.0–10.5)

## 2013-10-29 LAB — HEPATIC FUNCTION PANEL
ALT: 29 U/L (ref 0–53)
AST: 31 U/L (ref 0–37)
Albumin: 4.5 g/dL (ref 3.5–5.2)
Alkaline Phosphatase: 51 U/L (ref 39–117)
BILIRUBIN DIRECT: 0.2 mg/dL (ref 0.0–0.3)
BILIRUBIN TOTAL: 1.2 mg/dL (ref 0.2–1.2)
Total Protein: 7.3 g/dL (ref 6.0–8.3)

## 2013-10-29 LAB — POCT URINALYSIS DIPSTICK
Bilirubin, UA: NEGATIVE
GLUCOSE UA: NEGATIVE
KETONES UA: NEGATIVE
Leukocytes, UA: NEGATIVE
Nitrite, UA: NEGATIVE
Protein, UA: NEGATIVE
SPEC GRAV UA: 1.02
Urobilinogen, UA: 0.2
pH, UA: 5.5

## 2013-10-29 LAB — TSH: TSH: 6.8 u[IU]/mL — AB (ref 0.35–4.50)

## 2013-10-29 LAB — PSA: PSA: 0.85 ng/mL (ref 0.10–4.00)

## 2013-11-05 ENCOUNTER — Ambulatory Visit (INDEPENDENT_AMBULATORY_CARE_PROVIDER_SITE_OTHER): Payer: BC Managed Care – PPO | Admitting: Family Medicine

## 2013-11-05 ENCOUNTER — Encounter: Payer: Self-pay | Admitting: Family Medicine

## 2013-11-05 VITALS — BP 140/80 | HR 64 | Temp 97.5°F | Ht 73.0 in | Wt 241.0 lb

## 2013-11-05 DIAGNOSIS — Z0000 Encounter for general adult medical examination without abnormal findings: Secondary | ICD-10-CM

## 2013-11-05 DIAGNOSIS — R319 Hematuria, unspecified: Secondary | ICD-10-CM

## 2013-11-05 DIAGNOSIS — E039 Hypothyroidism, unspecified: Secondary | ICD-10-CM

## 2013-11-05 DIAGNOSIS — Z23 Encounter for immunization: Secondary | ICD-10-CM

## 2013-11-05 LAB — POCT URINALYSIS DIPSTICK
Bilirubin, UA: NEGATIVE
Glucose, UA: NEGATIVE
KETONES UA: NEGATIVE
Leukocytes, UA: NEGATIVE
Nitrite, UA: NEGATIVE
PH UA: 6.5
SPEC GRAV UA: 1.02
Urobilinogen, UA: 0.2

## 2013-11-05 LAB — URINALYSIS, MICROSCOPIC ONLY

## 2013-11-05 NOTE — Progress Notes (Signed)
Pre visit review using our clinic review tool, if applicable. No additional management support is needed unless otherwise documented below in the visit note. 

## 2013-11-05 NOTE — Patient Instructions (Signed)
Check on coverage for shingles vaccine Consider alternative methods for colon cancer screening such as virtual colonoscopy or stool DNA assessment ( Cologuard) -if covered by insurance Schedule followup thyroid assessment in 6 months

## 2013-11-05 NOTE — Progress Notes (Signed)
Subjective:    Patient ID: Samuel [name], male    DOB: 12/17/1950, 63 y.o.   MRN: [id number]  HPI Patient here for complete physical. He has history of hypothyroidism, borderline elevated blood pressure, history of aortic valve replacement on chronic Coumadin. Last tetanus unknown. No history of shingles vaccine. Last colonoscopy 2005. He also has history of kidney stones. No recent gross hematuria. He is on thyroid replacement. He missed about 4 weeks of therapy about 2 months ago but didn't consistently back on replacement therapy for the past month. No specific complaints. He does have some generalized fatigue  Past Medical History  Diagnosis Date  . ONYCHOMYCOSIS 11/02/2008  . HYPOTHYROIDISM 08/26/2008  . HYPERLIPIDEMIA-MIXED 04/01/2009  . Sudden visual loss 12/25/2008  . TRANSIENT ISCHEMIC ATTACK 01/25/2009  . HEMORRHOIDS-INTERNAL 12/20/2009  . KNEE PAIN 11/02/2008  . LATERAL EPICONDYLITIS 06/02/2009  . 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 INSOMNIA, TRANSIENT 04/27/2009  . PERSONAL HX COLONIC POLYPS 12/20/2009  . Migraines   . Aortic aneurysm   . Dyslipidemia   . Kidney stones   . Hemorrhoids    Past Surgical History  Procedure Laterality Date  . S/p avr      with aortic root replacement 02/2009  . Cystectomy      from abdominal wall  . Tonsillectomy      reports that he has quit smoking. He has never used smokeless tobacco. He reports that he drinks about 8.4 ounces of alcohol per week. He reports that he does not use illicit drugs. family history includes Diabetes in his mother; Heart disease in his father. Allergies  Allergen Reactions  . Penicillins     REACTION: childhood, reaction unknown      Review of Systems  Constitutional: Positive for fatigue. Negative for fever, activity change and appetite change.  HENT: Negative for congestion, ear pain and trouble swallowing.   Eyes: Negative for pain and visual disturbance.  Respiratory: Negative for cough, shortness of  breath and wheezing.   Cardiovascular: Negative for chest pain and palpitations.  Gastrointestinal: Negative for nausea, vomiting, abdominal pain, diarrhea, constipation, blood in stool, abdominal distention and rectal pain.  Genitourinary: Negative for dysuria, hematuria and testicular pain.  Musculoskeletal: Negative for arthralgias and joint swelling.  Skin: Negative for rash.  Neurological: Negative for dizziness, syncope and headaches.  Hematological: Negative for adenopathy.  Psychiatric/Behavioral: Negative for confusion and dysphoric mood.       Objective:   Physical Exam  Constitutional: He is oriented to person, place, and time. He appears well-developed and well-nourished. No distress.  HENT:  Head: Normocephalic and atraumatic.  Right Ear: External ear normal.  Left Ear: External ear normal.  Mouth/Throat: Oropharynx is clear and moist.  Eyes: Conjunctivae and EOM are normal. Pupils are equal, round, and reactive to light.  Neck: Normal range of motion. Neck supple. No thyromegaly present.  Cardiovascular: Normal rate, regular rhythm and normal heart sounds.   No murmur heard. Pulmonary/Chest: No respiratory distress. He has no wheezes. He has no rales.  Abdominal: Soft. Bowel sounds are normal. He exhibits no distension and no mass. There is no tenderness. There is no rebound and no guarding.  Genitourinary:  Patient declined  Musculoskeletal: He exhibits no edema.  Lymphadenopathy:    He has no cervical adenopathy.  Neurological: He is alert and oriented to person, place, and time. He displays normal reflexes. No cranial nerve deficit.  Skin: No rash noted.  Psychiatric: He has a normal mood and affect.  Assessment & Plan:  Complete physical. Tetanus booster given. Check on coverage for shingles vaccine. We discussed repeat colonoscopy screening. Because of his Coumadin would have to be bridged with Lovenox. He is interested in possible alternatives, if  covered by insurance. These would include virtual colonoscopy or stool altered DNA testing (Cologuard). Labs reviewed with patient. Repeat urine and if positive for urine micro. Repeat TSH 6 months

## 2013-11-13 ENCOUNTER — Ambulatory Visit: Payer: BC Managed Care – PPO

## 2013-11-18 ENCOUNTER — Encounter: Payer: Self-pay | Admitting: Internal Medicine

## 2013-12-22 ENCOUNTER — Other Ambulatory Visit: Payer: Self-pay | Admitting: Family Medicine

## 2014-01-12 ENCOUNTER — Other Ambulatory Visit: Payer: Self-pay | Admitting: Family Medicine

## 2014-01-14 ENCOUNTER — Telehealth: Payer: Self-pay | Admitting: Family Medicine

## 2014-01-14 ENCOUNTER — Other Ambulatory Visit: Payer: Self-pay | Admitting: Family Medicine

## 2014-01-14 MED ORDER — WARFARIN SODIUM 5 MG PO TABS: ORAL_TABLET | ORAL | Status: DC

## 2014-01-14 NOTE — Telephone Encounter (Signed)
Pt is out of warfarin (COUMADIN) 5 MG tablet Took last dose Monday. Pt  has appt next Tues. However, pt  states he needs enough to get him through to at least his appt and needs to take last nights dose as well.   [facility]/ [address]

## 2014-01-14 NOTE — Telephone Encounter (Signed)
15 tabs sent to pharmacy. Pt must be seen in office before full refill is provided

## 2014-01-20 ENCOUNTER — Ambulatory Visit (INDEPENDENT_AMBULATORY_CARE_PROVIDER_SITE_OTHER): Payer: BC Managed Care – PPO | Admitting: Family

## 2014-01-20 DIAGNOSIS — G459 Transient cerebral ischemic attack, unspecified: Secondary | ICD-10-CM

## 2014-01-20 DIAGNOSIS — Z5181 Encounter for therapeutic drug level monitoring: Secondary | ICD-10-CM

## 2014-01-20 DIAGNOSIS — Q231 Congenital insufficiency of aortic valve: Secondary | ICD-10-CM

## 2014-01-20 LAB — POCT INR: INR: 2.1

## 2014-01-20 MED ORDER — WARFARIN SODIUM 5 MG PO TABS: ORAL_TABLET | ORAL | Status: DC

## 2014-01-20 NOTE — Patient Instructions (Addendum)
    Continue same dose of 1 1/2 tablets every day except 2 tablets on Sunday, Tuesday and Thursday.  Recheck INR in 6 weeks.   Anticoagulation Dose Instructions as of 01/20/2014     Glynis SmilesSun Mon Tue Wed Thu Fri Sat   New Dose 10 mg 7.5 mg 10 mg 7.5 mg 10 mg 7.5 mg 7.5 mg    Description        Continue same dose of 1 1/2 tablets every day except 2 tablets on Sunday, Tuesday and Thursday.  Recheck INR in 6 weeks.

## 2014-02-09 ENCOUNTER — Ambulatory Visit (INDEPENDENT_AMBULATORY_CARE_PROVIDER_SITE_OTHER): Payer: BC Managed Care – PPO | Admitting: Family Medicine

## 2014-02-09 ENCOUNTER — Encounter: Payer: Self-pay | Admitting: Family Medicine

## 2014-02-09 VITALS — BP 140/72 | HR 70 | Temp 98.5°F | Wt 236.0 lb

## 2014-02-09 DIAGNOSIS — L03012 Cellulitis of left finger: Secondary | ICD-10-CM

## 2014-02-09 DIAGNOSIS — L02519 Cutaneous abscess of unspecified hand: Secondary | ICD-10-CM

## 2014-02-09 DIAGNOSIS — R39198 Other difficulties with micturition: Secondary | ICD-10-CM

## 2014-02-09 DIAGNOSIS — L03019 Cellulitis of unspecified finger: Secondary | ICD-10-CM

## 2014-02-09 DIAGNOSIS — R319 Hematuria, unspecified: Secondary | ICD-10-CM

## 2014-02-09 LAB — POCT URINALYSIS DIPSTICK
Bilirubin, UA: NEGATIVE
Glucose, UA: NEGATIVE
KETONES UA: NEGATIVE
Leukocytes, UA: NEGATIVE
Nitrite, UA: POSITIVE
PH UA: 6
PROTEIN UA: NEGATIVE
SPEC GRAV UA: 1.01
Urobilinogen, UA: 0.2

## 2014-02-09 MED ORDER — TAMSULOSIN HCL 0.4 MG PO CAPS: 0.4000 mg | ORAL_CAPSULE | Freq: Every day | ORAL | Status: DC

## 2014-02-09 MED ORDER — CEPHALEXIN 500 MG PO CAPS: 500.0000 mg | ORAL_CAPSULE | Freq: Three times a day (TID) | ORAL | Status: DC

## 2014-02-09 NOTE — Patient Instructions (Signed)
Warm salt water soaks for thumb couple of times daily

## 2014-02-09 NOTE — Progress Notes (Signed)
Pre visit review using our clinic review tool, if applicable. No additional management support is needed unless otherwise documented below in the visit note. 

## 2014-02-09 NOTE — Progress Notes (Signed)
   Subjective:    Patient ID: Samuel [name], male    DOB: [birth year], 63 y.o.   MRN: [id number]  Laceration     Patient here for several items as follows:  Left thumb pain. Onset a few days ago. No injury. Throbbing pain last night. Increased redness. No fevers or chills. Has not tried any warm soaks.  Slow urinary stream over the past month or so. He has urine frequency at night and sometimes dribbling and slow stream. No history of BPH. Recent PSA was normal. Denies any burning with urination. No gross hematuria. No flank pain.  He has history of aortic valve replacement and is on chronic Coumadin for. No recent bleeding complications.  Pre-visit discussion using our clinic review tool. No additional management support is needed unless otherwise documented below in the visit note. Past Medical History  Diagnosis Date  . ONYCHOMYCOSIS 11/02/2008  . HYPOTHYROIDISM 08/26/2008  . HYPERLIPIDEMIA-MIXED 04/01/2009  . Sudden visual loss 12/25/2008  . TRANSIENT ISCHEMIC ATTACK 01/25/2009  . HEMORRHOIDS-INTERNAL 12/20/2009  . KNEE PAIN 11/02/2008  . LATERAL EPICONDYLITIS 06/02/2009  . 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 INSOMNIA, TRANSIENT 04/27/2009  . PERSONAL HX COLONIC POLYPS 12/20/2009  . Migraines   . Aortic aneurysm   . Dyslipidemia   . Kidney stones   . Hemorrhoids    Past Surgical History  Procedure Laterality Date  . S/p avr      with aortic root replacement 02/2009  . Cystectomy      from abdominal wall  . Tonsillectomy      reports that he has quit smoking. He has never used smokeless tobacco. He reports that he drinks about 8.4 ounces of alcohol per week. He reports that he does not use illicit drugs. family history includes Diabetes in his mother; Heart disease in his father. Allergies  Allergen Reactions  . Penicillins     REACTION: childhood, reaction unknown       Review of Systems  Constitutional: Negative for fever, chills and fatigue.  Eyes: Negative for  visual disturbance.  Respiratory: Negative for cough, chest tightness and shortness of breath.   Cardiovascular: Negative for chest pain, palpitations and leg swelling.  Genitourinary: Positive for frequency and decreased urine volume. Negative for hematuria.  Neurological: Negative for dizziness, syncope, weakness, light-headedness and headaches.       Objective:   Physical Exam  Constitutional: He appears well-developed and well-nourished.  Cardiovascular: Normal rate.   Pulmonary/Chest: Breath sounds normal. No respiratory distress. He has no wheezes. He has no rales.  Genitourinary:  Prostate is not particularly enlarged. No nodules. No asymmetry  Musculoskeletal:  Left thumb- mild erythema and tenderness along the medial border. No evidence for any visible paronychia.  No fluctuance.          Assessment & Plan:  #1 cellulitis left thumb. No obvious abscess.  Warm saltwater soaks. Keflex 500 mg 3 times a day for 7 days. He is cautioned this may increase his INR and he should get this rechecked in one to 2 weeks #2 slow urinary stream. Urine dipstick reveals positive moderate nitrite and blood. Send urine culture and micro-. Keflex as above. Flomax 0.4 mg once daily if symptoms persist.

## 2014-02-10 LAB — URINALYSIS, MICROSCOPIC ONLY
Bacteria, UA: NONE SEEN
Casts: NONE SEEN
Crystals: NONE SEEN
SQUAMOUS EPITHELIAL / LPF: NONE SEEN

## 2014-02-11 LAB — URINE CULTURE
COLONY COUNT: NO GROWTH
Organism ID, Bacteria: NO GROWTH

## 2014-03-04 ENCOUNTER — Ambulatory Visit: Payer: BC Managed Care – PPO | Admitting: Family

## 2014-03-11 ENCOUNTER — Ambulatory Visit: Payer: BC Managed Care – PPO | Admitting: Family

## 2014-03-11 DIAGNOSIS — Z0289 Encounter for other administrative examinations: Secondary | ICD-10-CM

## 2014-03-30 ENCOUNTER — Encounter: Payer: Self-pay | Admitting: Family Medicine

## 2014-03-30 ENCOUNTER — Ambulatory Visit (INDEPENDENT_AMBULATORY_CARE_PROVIDER_SITE_OTHER): Payer: BC Managed Care – PPO | Admitting: Family Medicine

## 2014-03-30 VITALS — BP 146/100 | HR 76 | Temp 98.2°F | Ht 73.0 in | Wt 238.0 lb

## 2014-03-30 DIAGNOSIS — G459 Transient cerebral ischemic attack, unspecified: Secondary | ICD-10-CM

## 2014-03-30 DIAGNOSIS — Q231 Congenital insufficiency of aortic valve: Secondary | ICD-10-CM

## 2014-03-30 DIAGNOSIS — I808 Phlebitis and thrombophlebitis of other sites: Secondary | ICD-10-CM

## 2014-03-30 DIAGNOSIS — Z7901 Long term (current) use of anticoagulants: Secondary | ICD-10-CM

## 2014-03-30 LAB — POCT INR: INR: 2.2

## 2014-03-30 MED ORDER — METHYLPREDNISOLONE 4 MG PO KIT: PACK | ORAL | Status: AC

## 2014-03-30 NOTE — Progress Notes (Signed)
Pre visit review using our clinic review tool, if applicable. No additional management support is needed unless otherwise documented below in the visit note. 

## 2014-03-30 NOTE — Progress Notes (Signed)
   Subjective:    Patient ID: Samuel [name], male    DOB: 03/30/1951, 63 y.o.   MRN: [id number]  HPI Here for 3 days of tightness and pain in a narrow streak down the left arm from the armpit to the wrist. No recent trauma but he has recently moved into a new home and has carried numerous boxes and packages in and out. No chest pain or SOB. His most recent INR was 2.1 on 01-20-14.    Review of Systems  Constitutional: Negative.   Respiratory: Negative.   Cardiovascular: Negative.   Neurological: Negative.        Objective:   Physical Exam  Constitutional: He appears well-developed and well-nourished. No distress.  Cardiovascular: Normal rate, regular rhythm, normal heart sounds and intact distal pulses.   Pulmonary/Chest: Effort normal and breath sounds normal.  Musculoskeletal:  The inner left arm has a tender firm band of tissue down the entire length of the arm consistent with an inflamed vein. No erythema or warmth          Assessment & Plan:  His INR is adequate today at 2.2. He seems to have phlebitis in the arm but we need to rule out a thrombus. Set up a venous doppler soon. He can use hot compresses on the arm, add a medrol dose pack for the inflammation.

## 2014-04-01 ENCOUNTER — Other Ambulatory Visit (HOSPITAL_COMMUNITY): Payer: Self-pay | Admitting: *Deleted

## 2014-04-01 ENCOUNTER — Ambulatory Visit (HOSPITAL_COMMUNITY): Payer: BC Managed Care – PPO | Attending: Cardiology | Admitting: *Deleted

## 2014-04-01 DIAGNOSIS — E785 Hyperlipidemia, unspecified: Secondary | ICD-10-CM | POA: Diagnosis not present

## 2014-04-01 DIAGNOSIS — Z87891 Personal history of nicotine dependence: Secondary | ICD-10-CM | POA: Diagnosis not present

## 2014-04-01 DIAGNOSIS — M79602 Pain in left arm: Secondary | ICD-10-CM | POA: Insufficient documentation

## 2014-04-01 NOTE — Progress Notes (Signed)
Venous duplex of the left upper extremity complete.

## 2014-05-11 ENCOUNTER — Other Ambulatory Visit: Payer: Self-pay | Admitting: Family Medicine

## 2014-06-15 ENCOUNTER — Other Ambulatory Visit: Payer: Self-pay | Admitting: Family Medicine

## 2014-07-02 ENCOUNTER — Ambulatory Visit (INDEPENDENT_AMBULATORY_CARE_PROVIDER_SITE_OTHER): Payer: BLUE CROSS/BLUE SHIELD | Admitting: Family Medicine

## 2014-07-02 ENCOUNTER — Encounter: Payer: Self-pay | Admitting: Family Medicine

## 2014-07-02 VITALS — BP 136/74 | HR 60 | Temp 98.0°F | Wt 243.0 lb

## 2014-07-02 DIAGNOSIS — R319 Hematuria, unspecified: Secondary | ICD-10-CM

## 2014-07-02 DIAGNOSIS — R350 Frequency of micturition: Secondary | ICD-10-CM

## 2014-07-02 DIAGNOSIS — Z1211 Encounter for screening for malignant neoplasm of colon: Secondary | ICD-10-CM

## 2014-07-02 LAB — POCT URINALYSIS DIPSTICK
BILIRUBIN UA: NEGATIVE
GLUCOSE UA: NEGATIVE
KETONES UA: NEGATIVE
LEUKOCYTES UA: NEGATIVE
Nitrite, UA: NEGATIVE
PROTEIN UA: NEGATIVE
Spec Grav, UA: 1.02
Urobilinogen, UA: 0.2
pH, UA: 5.5

## 2014-07-02 NOTE — Progress Notes (Signed)
Pre visit review using our clinic review tool, if applicable. No additional management support is needed unless otherwise documented below in the visit note. 

## 2014-07-02 NOTE — Patient Instructions (Signed)
Benign Prostatic Hypertrophy The prostate gland is part of the reproductive system of men. A normal prostate is about the size and shape of a walnut. The prostate gland produces a fluid that is mixed with sperm to make semen. This gland surrounds the urethra and is located in front of the rectum and just below the bladder. The bladder is where urine is stored. The urethra is the tube through which urine passes from the bladder to get out of the body. The prostate grows as a man ages. An enlarged prostate not caused by cancer is called benign prostatic hypertrophy (BPH). An enlarged prostate can press on the urethra. This can make it harder to pass urine. In the early stages of enlargement, the bladder can get by with a narrowed urethra by forcing the urine through. If the problem gets worse, medical or surgical treatment may be required.  This condition should be followed by your health care provider. The accumulation of urine in the bladder can cause infection. Back pressure and infection can progress to bladder damage and kidney (renal) failure. If needed, your health care provider may refer you to a specialist in kidney and prostate disease (urologist). CAUSES  BPH is a common health problem in men older than 50 years. This condition is a normal part of aging. However, not all men will develop problems from this condition. If the enlargement grows away from the urethra, then there will not be any compression of the urethra and resistance to urine flow.If the growth is toward the urethra and compresses it, you will experience difficulty urinating.  SYMPTOMS   Not able to completely empty your bladder.  Getting up often during the night to urinate.  Need to urinate frequently during the day.  Difficultly starting urine flow.  Decrease in size and strength of your urine stream.  Dribbling after urination.  Pain on urination (more common with infection).  Inability to pass urine. This needs  immediate treatment.  The development of a urinary tract infection. DIAGNOSIS  These tests will help your health care provider understand your problem:  A thorough history and physical examination.  A urination history, with the number of times you urinate, the amounts of urine, the strength of the urine stream, and the feeling of emptiness or fullness after urinating.  A postvoid bladder scan that measures any amount of urine that may remain in your bladder after you finish urinating.  Digital rectal exam. In a rectal exam, your health care provider checks your prostate by putting a gloved, lubricated finger into your rectum to feel the back of your prostate gland. This exam detects the size of your gland and abnormal lumps or growths.  Exam of your urine (urinalysis).  Prostate specific antigen (PSA) screening. This is a blood test used to screen for prostate cancer.  Rectal ultrasonography. This test uses sound waves to electronically produce a picture of your prostate gland. TREATMENT  Once symptoms begin, your health care provider will monitor your condition. Of the men with this condition, one third will have symptoms that stabilize, one third will have symptoms that improve, and one third will have symptoms that progress in the first year. Mild symptoms may not need treatment. Simple observation and yearly exams may be all that is required. Medicines and surgery are options for more severe problems. Your health care provider can help you make an informed decision for what is best. Two classes of medicines are available for relief of prostate symptoms:  Medicines   that shrink the prostate. This helps relieve symptoms. These medicines take time to work, and it may be months before any improvement is seen.  Uncommon side effects include problems with sexual function.  Medicines to relax the muscle of the prostate. This also relieves the obstruction by reducing any compression on the  urethra.This group of medicines work much faster than those that reduce the size of the prostate gland. Usually, one can experience improvement in days to weeks..  Side effects can include dizziness, fatigue, lightheadedness, and retrograde ejaculation (diminished volume of ejaculate). Several types of surgical treatments are available for relief of prostate symptoms:  Transurethral resection of the prostate (TURP)--In this treatment, an instrument is inserted through opening at the tip of the penis. It is used to cut away pieces of the inner core of the prostate. The pieces are removed through the same opening of the penis. This removes the obstruction and helps get rid of the symptoms.  Transurethral incision (TUIP)--In this procedure, small cuts are made in the prostate. This lessens the prostates pressure on the urethra.  Transurethral microwave thermotherapy (TUMT)--This procedure uses microwaves to create heat. The heat destroys and removes a small amount of prostate tissue.  Transurethral needle ablation (TUNA)--This is a procedure that uses radio frequencies to do the same as TUMT.  Interstitial laser coagulation (ILC)--This is a procedure that uses a laser to do the same as TUMT and TUNA.  Transurethral electrovaporization (TUVP)--This is a procedure that uses electrodes to do the same as the procedures listed above. SEEK MEDICAL CARE IF:   You develop a fever.  There is unexplained back pain.  Symptoms are not helped by medicines prescribed.  You develop side effects from the medicine you are taking.  Your urine becomes very dark or has a bad smell.  Your lower abdomen becomes distended and you have difficulty passing your urine. SEEK IMMEDIATE MEDICAL CARE IF:   You are suddenly unable to urinate. This is an emergency. You should be seen immediately.  There are large amounts of blood or clots in the urine.  Your urinary problems become unmanageable.  You develop  lightheadedness, severe dizziness, or you feel faint.  You develop moderate to severe low back or flank pain.  You develop chills or fever. Document Released: 05/29/2005 Document Revised: 06/03/2013 Document Reviewed: 12/12/2012 Exit[employer] Patient Information 2015 [employer]. This information is not intended to replace advice given to you by your health care provider. Make sure you discuss any questions you have with your health care provider.  

## 2014-07-02 NOTE — Progress Notes (Signed)
   Subjective:    Patient ID: Samuel [name], male    DOB: 10/28/1950, 64 y.o.   MRN: [id number]  HPI   Seen with concern for urine frequency at night off and on for the past several months. He was seen last year for this and we started Flomax but he had some possible numbness, penis area and only took this for a few nights and stopped and did not see much change in his symptoms. Normal PSA 8 months ago. His urine frequency is mostly at night getting up about every one half hours. Also a slow stream and occasional dribbling. No burning with urination. No caffeine use at night. No alcohol use. He's not tried other medications in the past. He does not have any sense of urgency.  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 He has not had colonoscopy in over 10 years and requesting referral   Review of Systems  Constitutional: Negative for fever and chills.  Genitourinary: Positive for frequency, decreased urine volume and difficulty urinating. Negative for hematuria.       Objective:   Physical Exam  Constitutional: He appears well-developed and well-nourished.  Cardiovascular: Normal rate and regular rhythm.   Pulmonary/Chest: Effort normal and breath sounds normal. No respiratory distress. He has no wheezes. He has no rales.  Genitourinary:  Prostate exam not done as this was done recently.          Assessment & Plan:  Urine frequency. Especially at night. Urinalysis today reveals 2+ blood otherwise normal. Send urine micro-. We discussed options such as finasteride and Avodart but explained these take several months to have effect. We recommend 1 more trial of Flomax for at least 3 weeks. If no response consider urologic referral

## 2014-07-03 LAB — URINALYSIS, MICROSCOPIC ONLY
Bacteria, UA: NONE SEEN
Casts: NONE SEEN
Crystals: NONE SEEN
Squamous Epithelial / LPF: NONE SEEN

## 2014-07-16 ENCOUNTER — Telehealth: Payer: Self-pay | Admitting: General Practice

## 2014-07-16 ENCOUNTER — Telehealth: Payer: Self-pay | Admitting: Family Medicine

## 2014-07-16 ENCOUNTER — Ambulatory Visit: Payer: BLUE CROSS/BLUE SHIELD

## 2014-07-16 NOTE — Telephone Encounter (Signed)
Pt has not had INR since 01/2014. Left message for pt to call back

## 2014-07-16 NOTE — Telephone Encounter (Signed)
Samuel [name] from Dr. Jennye BoroughsMedoff's office states patient is scheduled for a colonoscopy on 08/04/14.  They need a written clearance letter advising when to stop and re-start Warfarin.  (f) [phone]

## 2014-07-16 NOTE — Telephone Encounter (Signed)
Pt scheduled for 2:15. Message forwarded to [facility]Samuel [name]

## 2014-07-20 ENCOUNTER — Telehealth: Payer: Self-pay

## 2014-07-20 ENCOUNTER — Other Ambulatory Visit: Payer: Self-pay

## 2014-07-20 ENCOUNTER — Ambulatory Visit: Payer: BLUE CROSS/BLUE SHIELD

## 2014-07-20 MED ORDER — ENOXAPARIN SODIUM 80 MG/0.8ML ~~LOC~~ SOLN: 80.0000 mg | Freq: Two times a day (BID) | SUBCUTANEOUS | Status: DC

## 2014-07-20 MED ORDER — ENOXAPARIN SODIUM 120 MG/0.8ML ~~LOC~~ SOLN: 120.0000 mg | Freq: Two times a day (BID) | SUBCUTANEOUS | Status: DC

## 2014-07-20 MED ORDER — WARFARIN SODIUM 5 MG PO TABS: ORAL_TABLET | ORAL | Status: DC

## 2014-07-20 NOTE — Telephone Encounter (Signed)
-----   Message from Kristian CoveyBruce W Burchette, MD sent at 07/19/2014  9:03 AM EST ----- Samuel [name],  Patient will need to stop his coumadin 5 days prior to colonoscopy.  He will need to start Lovenox injection 120 mg/0.8 ml and will give 0.8 ml (or 120 mg) every 12 hours starting the first day he stops the coumadin.  He will start back the coumadin the day following his colonoscopy (unless otherwise instructed by GI) and CONTINUE the Lovenox until after his INR is back to goal ( usually at least 5 days after starting back the Coumadin)  He has had past hx of POOR compliance with follow up with coumadin INRs and IMPERATIVE that he cooperate with follow up monitoring for this in timely manner to avoid any complications.  He will need to get INR repeat within 5 days of starting his Coumadin back following the procedure. ----- Message -----    From: Thomasena EdisMontrice E Maiya Kates, CMA    Sent: 07/16/2014   2:25 PM      To: Kristian CoveyBruce W Burchette, MD    ----- Message -----    From: Garrison Columbusynthia D Boyd, RN    Sent: 07/16/2014   2:01 PM      To: Donisha Hoch E Adela LankFloyd, CMA  Hi [name],  Patient is coming in today to coumadin clinic.  He will be off coumadin for 5 days for colonoscopy on  2/23.  Would you ask Dr. Caryl NeverBurchette if patient should be bridged with Lovenox?  He has history of CVA and a heart valve.  Thanks,  Bailey Mechindy Boyd

## 2014-07-20 NOTE — Telephone Encounter (Signed)
Patient states his colonoscopy is 08/04/14 and would like to speak back to you about that.

## 2014-07-20 NOTE — Telephone Encounter (Signed)
Patient called back and would like to speak to you as soon as possible.

## 2014-07-20 NOTE — Telephone Encounter (Signed)
-----   Message from Bruce W Burchette, MD sent at 07/19/2014  9:03 AM EST ----- Dennie Moltz,  Patient will need to stop his coumadin 5 days prior to colonoscopy.  He will need to start Lovenox injection 120 mg/0.8 ml and will give 0.8 ml (or 120 mg) every 12 hours starting the first day he stops the coumadin.  He will start back the coumadin the day following his colonoscopy (unless otherwise instructed by GI) and CONTINUE the Lovenox until after his INR is back to goal ( usually at least 5 days after starting back the Coumadin)  He has had past hx of POOR compliance with follow up with coumadin INRs and IMPERATIVE that he cooperate with follow up monitoring for this in timely manner to avoid any complications.  He will need to get INR repeat within 5 days of starting his Coumadin back following the procedure. ----- Message -----    From: Cinque Begley E Zailen Albarran, CMA    Sent: 07/16/2014   2:25 PM      To: Bruce W Burchette, MD    ----- Message -----    From: Cynthia D Boyd, RN    Sent: 07/16/2014   2:01 PM      To: Kamarri Lovvorn E Marg Macmaster, CMA  Hi Rafiq Bucklin,  Patient is coming in today to coumadin clinic.  He will be off coumadin for 5 days for colonoscopy on  2/23.  Would you ask Dr. Burchette if patient should be bridged with Lovenox?  He has history of CVA and a heart valve.  Thanks,  Cindy Boyd  

## 2014-07-20 NOTE — Telephone Encounter (Signed)
Pt is informed. And Rx sent to pharmacy

## 2014-07-20 NOTE — Telephone Encounter (Signed)
Pt aware Rx's sent to pharmacy

## 2014-07-20 NOTE — Telephone Encounter (Signed)
Left message for patient to return call.

## 2014-07-20 NOTE — Telephone Encounter (Signed)
Refill Coumadin for one month.  He knows about starting the Lovenox when he is off the coumadin for procedure.  He also knows that he MUST return 5 days after starting his coumadin back post procedure.

## 2014-07-29 ENCOUNTER — Telehealth: Payer: Self-pay | Admitting: Family Medicine

## 2014-07-29 NOTE — Telephone Encounter (Signed)
OK if we have samples. If not, we really have no option about him being anti-coagulated with his valve replacement.  He cannot stay on Coumadin and have the colonoscopy so Lovenox is our only real option.

## 2014-07-29 NOTE — Telephone Encounter (Signed)
Pt can not afford lovenox copay is 200.00. Pt would like samples. Pt colonoscopy is on 08-04-14

## 2014-07-30 NOTE — Telephone Encounter (Signed)
No samples of the 120mg  we only have  80mg  samples.

## 2014-07-30 NOTE — Telephone Encounter (Signed)
His dose must be 120 mg (based on his body weight).  I'm not sure if he could get 120 mg out of the 80 mg samples but we would also need to confirm that he had at least 10 days worth.

## 2014-07-30 NOTE — Telephone Encounter (Signed)
Yes it would have been for 10 days.

## 2014-07-31 NOTE — Telephone Encounter (Signed)
Left message for patient to return call.

## 2014-07-31 NOTE — Telephone Encounter (Signed)
He will need to get the 120 mg Lovenox or cancel the colonoscopy.  I feel that giving the 80 mg samples would be somewhat risky as far as getting this measured out just right.

## 2014-08-03 NOTE — Telephone Encounter (Signed)
Pt informed. Pt cancelled the colonoscopy

## 2014-10-16 ENCOUNTER — Telehealth: Payer: Self-pay | Admitting: Family Medicine

## 2014-10-16 NOTE — Telephone Encounter (Signed)
FYI: On call note: pharmacy called to clarify lovenox instructions. Pt understands instructions to be: take lovenex bid until day of colonoscopy, which would be about 7d, but the dispense amount on rx was for 10d. I told them to have pt take the lovenox bid and his last dose would be the night prior to his colonoscopy. I told him to ask the GI MD OR his PCP for instructions about any restart of lovenox after the colonoscopy.

## 2014-10-19 ENCOUNTER — Telehealth: Payer: Self-pay | Admitting: *Deleted

## 2014-10-19 NOTE — Telephone Encounter (Signed)
PLEASE NOTE: All timestamps contained within this report are represented as [place] Standard Time. CONFIDENTIALTY NOTICE: This fax transmission is intended only for the addressee. It contains information that is legally privileged, confidential or otherwise protected from use or disclosure. If you are not the intended recipient, you are strictly prohibited from reviewing, disclosing, copying using or disseminating any of this information or taking any action in reliance on or regarding this information. If you have received this fax in error, please notify us immediately by telephone so that we can arrange for its return to us. Phone: [phone], Toll-Free: [phone], Fax: [phone] Page: 1 of 1 Call Id: [id number] [facility] Night - Client TELEPHONE ADVICE RECORD [facility] Patient Name: Samuel CannerJOEL TUSING Gender: Male DOB: 02/27/1976 Age: [age] Y 7 M 20 D Return Phone Number: [phone] (Primary) Address: [place]Zip: [place] [address] Client [facility] Night - Client Client Site [facility] - Night Physician Gershon CraneFry, Stephen Contact Type Call Call Type Triage / Clinical Caller Name Cordelia PenSherry Relationship To Patient Mother Return Phone Number [phone] (Primary) Chief Complaint Diarrhea Initial Comment Caller states her son has diarrhea with nausea. Not eating Nurse Assessment Nurse: Lucretia FieldGrossman, RN, Santina Evansatherine Date/Time Lamount Cohen(Eastern Time): 10/17/2014 4:12:56 PM Confirm and document reason for call. If symptomatic, describe symptoms. ---Caller states her son has diarrhea with nausea. Not eating. Wed am he had nausea. Threw up a few times. Diarrhea and aches, low grade fever. Fever broke on Thursday. Has the patient traveled out of the country within the last 30 days? ---Not Applicable Does the patient require triage? ---Yes Related visit to physician within the last 2 weeks? ---No Does the PT have any  chronic conditions? (i.e. diabetes, asthma, etc.) ---No Guidelines Guideline Title Affirmed Question Affirmed Notes Nurse Date/Time (Eastern Time) Disp. Time Lamount Cohen(Eastern Time) Disposition Final User 10/17/2014 4:22:41 PM Clinical Call Yes Lucretia FieldGrossman, RN, Santina Evansatherine After Care Instructions Given Call Event Type User Date / Time Description Comments User: Elenore Paddyatherine, Grossman, RN Date/Time Lamount Cohen(Eastern Time): 10/17/2014 4:25:31 PM Francis DowseJoel was not with his mother, so advised to call back when he could answer questions. Advised mother to make sure his urine output was ok.

## 2014-10-19 NOTE — Telephone Encounter (Signed)
Documented under wrong chart, please disregard

## 2014-11-11 ENCOUNTER — Other Ambulatory Visit (INDEPENDENT_AMBULATORY_CARE_PROVIDER_SITE_OTHER): Payer: BLUE CROSS/BLUE SHIELD

## 2014-11-11 DIAGNOSIS — Z5181 Encounter for therapeutic drug level monitoring: Secondary | ICD-10-CM

## 2014-11-11 DIAGNOSIS — Z0000 Encounter for general adult medical examination without abnormal findings: Secondary | ICD-10-CM

## 2014-11-11 LAB — HEPATIC FUNCTION PANEL
ALBUMIN: 4.6 g/dL (ref 3.5–5.2)
ALT: 32 U/L (ref 0–53)
AST: 27 U/L (ref 0–37)
Alkaline Phosphatase: 60 U/L (ref 39–117)
BILIRUBIN DIRECT: 0.1 mg/dL (ref 0.0–0.3)
Total Bilirubin: 0.7 mg/dL (ref 0.2–1.2)
Total Protein: 7.2 g/dL (ref 6.0–8.3)

## 2014-11-11 LAB — LIPID PANEL
CHOL/HDL RATIO: 5
Cholesterol: 174 mg/dL (ref 0–200)
HDL: 32.9 mg/dL — ABNORMAL LOW (ref >39.00)
LDL Cholesterol: 122 mg/dL — ABNORMAL HIGH (ref 0–99)
NonHDL: 141.1
Triglycerides: 96 mg/dL (ref 0.0–149.0)
VLDL: 19.2 mg/dL (ref 0.0–40.0)

## 2014-11-11 LAB — BASIC METABOLIC PANEL
BUN: 12 mg/dL (ref 6–23)
CALCIUM: 9.1 mg/dL (ref 8.4–10.5)
CO2: 32 meq/L (ref 19–32)
Chloride: 102 mEq/L (ref 96–112)
Creatinine, Ser: 1.09 mg/dL (ref 0.40–1.50)
GFR: 72.33 mL/min (ref >60.00)
Glucose, Bld: 104 mg/dL — ABNORMAL HIGH (ref 70–99)
Potassium: 4.7 mEq/L (ref 3.5–5.1)
SODIUM: 138 meq/L (ref 135–145)

## 2014-11-11 LAB — CBC WITH DIFFERENTIAL/PLATELET
BASOS ABS: 0 10*3/uL (ref 0.0–0.1)
Basophils Relative: 0.6 % (ref 0.0–3.0)
Eosinophils Absolute: 0.1 10*3/uL (ref 0.0–0.7)
Eosinophils Relative: 1.3 % (ref 0.0–5.0)
HEMATOCRIT: 47.5 % (ref 39.0–52.0)
Hemoglobin: 16.4 g/dL (ref 13.0–17.0)
Lymphocytes Relative: 28 % (ref 12.0–46.0)
Lymphs Abs: 1.2 10*3/uL (ref 0.7–4.0)
MCHC: 34.5 g/dL (ref 30.0–36.0)
MCV: 93.4 fl (ref 78.0–100.0)
MONO ABS: 0.5 10*3/uL (ref 0.1–1.0)
MONOS PCT: 12.4 % — AB (ref 3.0–12.0)
Neutro Abs: 2.4 10*3/uL (ref 1.4–7.7)
Neutrophils Relative %: 57.7 % (ref 43.0–77.0)
PLATELETS: 190 10*3/uL (ref 150.0–400.0)
RBC: 5.08 Mil/uL (ref 4.22–5.81)
RDW: 13.4 % (ref 11.5–15.5)
WBC: 4.1 10*3/uL (ref 4.0–10.5)

## 2014-11-11 LAB — POCT INR: INR: 1.5

## 2014-11-11 LAB — PSA: PSA: 0.73 ng/mL (ref 0.10–4.00)

## 2014-11-11 LAB — TSH: TSH: 9.83 u[IU]/mL — AB (ref 0.35–4.50)

## 2014-11-12 ENCOUNTER — Ambulatory Visit: Payer: Self-pay | Admitting: Certified Registered Nurse Anesthetist

## 2014-11-12 DIAGNOSIS — Q231 Congenital insufficiency of aortic valve: Secondary | ICD-10-CM

## 2014-11-12 DIAGNOSIS — Z5181 Encounter for therapeutic drug level monitoring: Secondary | ICD-10-CM

## 2014-11-12 DIAGNOSIS — G459 Transient cerebral ischemic attack, unspecified: Secondary | ICD-10-CM

## 2014-11-17 ENCOUNTER — Ambulatory Visit (INDEPENDENT_AMBULATORY_CARE_PROVIDER_SITE_OTHER): Payer: BLUE CROSS/BLUE SHIELD | Admitting: Family Medicine

## 2014-11-17 ENCOUNTER — Ambulatory Visit (INDEPENDENT_AMBULATORY_CARE_PROVIDER_SITE_OTHER): Payer: BLUE CROSS/BLUE SHIELD | Admitting: General Practice

## 2014-11-17 ENCOUNTER — Encounter: Payer: Self-pay | Admitting: Family Medicine

## 2014-11-17 ENCOUNTER — Other Ambulatory Visit: Payer: Self-pay | Admitting: General Practice

## 2014-11-17 VITALS — BP 132/80 | HR 63 | Temp 98.6°F | Ht 72.0 in | Wt 245.0 lb

## 2014-11-17 DIAGNOSIS — Q231 Congenital insufficiency of aortic valve: Secondary | ICD-10-CM | POA: Diagnosis not present

## 2014-11-17 DIAGNOSIS — Z5181 Encounter for therapeutic drug level monitoring: Secondary | ICD-10-CM | POA: Diagnosis not present

## 2014-11-17 DIAGNOSIS — E039 Hypothyroidism, unspecified: Secondary | ICD-10-CM

## 2014-11-17 DIAGNOSIS — Z0000 Encounter for general adult medical examination without abnormal findings: Secondary | ICD-10-CM

## 2014-11-17 LAB — POCT INR: INR: 1.7

## 2014-11-17 MED ORDER — WARFARIN SODIUM 5 MG PO TABS: ORAL_TABLET | ORAL | Status: DC

## 2014-11-17 MED ORDER — LEVOTHYROXINE SODIUM 125 MCG PO TABS: 125.0000 ug | ORAL_TABLET | Freq: Every day | ORAL | Status: DC

## 2014-11-17 NOTE — Progress Notes (Signed)
Pre visit review using our clinic review tool, if applicable. No additional management support is needed unless otherwise documented below in the visit note. 

## 2014-11-17 NOTE — Progress Notes (Signed)
Subjective:    Patient ID: Samuel [name], male    DOB: 1951-04-06, 64 y.o.   MRN: [id number]  HPI Patient seen for complete physical. His chronic problems include history of hypothyroidism, bicuspid aortic valve with previous aortic valve replacement on chronic Coumadin. He had recent colonoscopy which showed benign polyps. His back on Coumadin. INR 6 days ago 1.5. No recent bleeding complications. He takes levothyroxin 112 mg daily. Tetanus up-to-date. No history of shingles vaccine. Nonsmoker. No consistent exercise  Reviewed with no changes: Past Medical History  Diagnosis Date  . ONYCHOMYCOSIS 11/02/2008  . HYPOTHYROIDISM 08/26/2008  . HYPERLIPIDEMIA-MIXED 04/01/2009  . Sudden visual loss 12/25/2008  . TRANSIENT ISCHEMIC ATTACK 01/25/2009  . HEMORRHOIDS-INTERNAL 12/20/2009  . KNEE PAIN 11/02/2008  . LATERAL EPICONDYLITIS 06/02/2009  . 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 INSOMNIA, TRANSIENT 04/27/2009  . PERSONAL HX COLONIC POLYPS 12/20/2009  . Migraines   . Aortic aneurysm   . Dyslipidemia   . Kidney stones   . Hemorrhoids    Past Surgical History  Procedure Laterality Date  . S/p avr      with aortic root replacement 02/2009  . Cystectomy      from abdominal wall  . Tonsillectomy      reports that he has quit smoking. He has never used smokeless tobacco. He reports that he drinks about 8.4 oz of alcohol per week. He reports that he does not use illicit drugs. family history includes Diabetes in his mother; Heart disease in his father. Allergies  Allergen Reactions  . Penicillins     REACTION: childhood, reaction unknown      Review of Systems  Constitutional: Negative for fever, activity change, appetite change and fatigue.  HENT: Negative for congestion, ear pain and trouble swallowing.   Eyes: Negative for pain and visual disturbance.  Respiratory: Negative for cough, shortness of breath and wheezing.   Cardiovascular: Negative for chest pain and palpitations.    Gastrointestinal: Negative for nausea, vomiting, abdominal pain, diarrhea, constipation, blood in stool, abdominal distention and rectal pain.  Endocrine: Negative for polydipsia and polyuria.  Genitourinary: Negative for dysuria, hematuria and testicular pain.  Musculoskeletal: Negative for joint swelling and arthralgias.  Skin: Negative for rash.  Neurological: Negative for dizziness, syncope and headaches.  Hematological: Negative for adenopathy.  Psychiatric/Behavioral: Negative for confusion and dysphoric mood.       Objective:   Physical Exam  Constitutional: He is oriented to person, place, and time. He appears well-developed and well-nourished. No distress.  HENT:  Head: Normocephalic and atraumatic.  Right Ear: External ear normal.  Left Ear: External ear normal.  Mouth/Throat: Oropharynx is clear and moist.  Eyes: Conjunctivae and EOM are normal. Pupils are equal, round, and reactive to light.  Neck: Normal range of motion. Neck supple. No thyromegaly present.  Cardiovascular: Normal rate, regular rhythm and normal heart sounds.   No murmur heard. Pulmonary/Chest: No respiratory distress. He has no wheezes. He has no rales.  Abdominal: Soft. Bowel sounds are normal. He exhibits no distension and no mass. There is no tenderness. There is no rebound and no guarding.  Musculoskeletal: He exhibits no edema.  Lymphadenopathy:    He has no cervical adenopathy.  Neurological: He is alert and oriented to person, place, and time. He displays normal reflexes. No cranial nerve deficit.  Skin: No rash noted.  Psychiatric: He has a normal mood and affect.          Assessment & Plan:  Complete physical.  Labs reviewed. He has prediabetes by recent labs. We have strongly advocated more consistent exercise and weight loss. He will check on coverage for shingles vaccine. Increased levothyroxin 125 g daily. Recheck TSH 3-4 months. Repeat INR today

## 2014-11-17 NOTE — Progress Notes (Signed)
I have reviewed and agree with the plan. 

## 2014-12-10 ENCOUNTER — Ambulatory Visit: Payer: BLUE CROSS/BLUE SHIELD

## 2014-12-10 ENCOUNTER — Telehealth: Payer: Self-pay | Admitting: Family Medicine

## 2014-12-10 NOTE — Telephone Encounter (Signed)
Pt can come in on Tuesday.

## 2014-12-10 NOTE — Telephone Encounter (Signed)
Per saundrea, we are able to work pt in on thurs for his cuom.  Pt is scheduled.

## 2014-12-10 NOTE — Telephone Encounter (Signed)
Pt would like to know if he can come on tues to get his coum checked? Arline AspCindy boyd is not here today, or Monday, and thursday is completely booked . So pt will have to wait 2 more weeks to get it checked . Pt refuses to go to [facility], stating he can walk here if needed pls advise

## 2014-12-17 ENCOUNTER — Ambulatory Visit (INDEPENDENT_AMBULATORY_CARE_PROVIDER_SITE_OTHER): Payer: BLUE CROSS/BLUE SHIELD | Admitting: General Practice

## 2014-12-17 DIAGNOSIS — Z5181 Encounter for therapeutic drug level monitoring: Secondary | ICD-10-CM

## 2014-12-17 DIAGNOSIS — Q231 Congenital insufficiency of aortic valve: Secondary | ICD-10-CM

## 2014-12-17 LAB — POCT INR: INR: 3.7

## 2014-12-17 NOTE — Progress Notes (Signed)
Pre visit review using our clinic review tool, if applicable. No additional management support is needed unless otherwise documented below in the visit note. 

## 2015-01-14 ENCOUNTER — Ambulatory Visit: Payer: BLUE CROSS/BLUE SHIELD

## 2015-03-04 ENCOUNTER — Ambulatory Visit (INDEPENDENT_AMBULATORY_CARE_PROVIDER_SITE_OTHER): Payer: BLUE CROSS/BLUE SHIELD | Admitting: Family Medicine

## 2015-03-04 ENCOUNTER — Encounter: Payer: Self-pay | Admitting: Family Medicine

## 2015-03-04 ENCOUNTER — Ambulatory Visit (INDEPENDENT_AMBULATORY_CARE_PROVIDER_SITE_OTHER): Payer: BLUE CROSS/BLUE SHIELD | Admitting: General Practice

## 2015-03-04 VITALS — BP 130/70 | HR 68 | Temp 98.5°F | Wt 248.0 lb

## 2015-03-04 DIAGNOSIS — Q231 Congenital insufficiency of aortic valve: Secondary | ICD-10-CM | POA: Diagnosis not present

## 2015-03-04 DIAGNOSIS — Z5181 Encounter for therapeutic drug level monitoring: Secondary | ICD-10-CM | POA: Diagnosis not present

## 2015-03-04 DIAGNOSIS — E038 Other specified hypothyroidism: Secondary | ICD-10-CM

## 2015-03-04 DIAGNOSIS — R600 Localized edema: Secondary | ICD-10-CM

## 2015-03-04 LAB — BASIC METABOLIC PANEL
BUN: 14 mg/dL (ref 6–23)
CHLORIDE: 102 meq/L (ref 96–112)
CO2: 27 mEq/L (ref 19–32)
CREATININE: 0.91 mg/dL (ref 0.40–1.50)
Calcium: 8.8 mg/dL (ref 8.4–10.5)
GFR: 89 mL/min (ref >60.00)
GLUCOSE: 111 mg/dL — AB (ref 70–99)
Potassium: 4 mEq/L (ref 3.5–5.1)
Sodium: 137 mEq/L (ref 135–145)

## 2015-03-04 LAB — TSH: TSH: 8.53 u[IU]/mL — ABNORMAL HIGH (ref 0.35–4.50)

## 2015-03-04 LAB — BRAIN NATRIURETIC PEPTIDE: Pro B Natriuretic peptide (BNP): 83 pg/mL (ref 0.0–100.0)

## 2015-03-04 LAB — POCT INR: INR: 3.1

## 2015-03-04 MED ORDER — WARFARIN SODIUM 5 MG PO TABS: ORAL_TABLET | ORAL | Status: DC

## 2015-03-04 MED ORDER — LEVOTHYROXINE SODIUM 137 MCG PO TABS: 137.0000 ug | ORAL_TABLET | Freq: Every day | ORAL | Status: DC

## 2015-03-04 NOTE — Progress Notes (Signed)
   Subjective:    Patient ID: Samuel [name], male    DOB: 06-23-1950, 64 y.o.   MRN: [id number]  HPI Bilateral leg edema over the past month.he has history of aortic valve replacement and is on chronic Coumadin. No history of heart failure. Does have hypothyroidism and recently under replaced. Needs follow-up levels. He states he is compliant with therapy. He does have some mild varicose veins.He notes that his edema is worse late in the day. He's had some mild weight gain. He has mild dyspnea with exertion which he thinks is due to deconditioning. No chest pains. No orthopnea. He had labs in June with normal albumen. Normal renal function.  He takes Coumadin and levothyroxin and no other medications.  Past Medical History  Diagnosis Date  . ONYCHOMYCOSIS 11/02/2008  . HYPOTHYROIDISM 08/26/2008  . HYPERLIPIDEMIA-MIXED 04/01/2009  . Sudden visual loss 12/25/2008  . TRANSIENT ISCHEMIC ATTACK 01/25/2009  . HEMORRHOIDS-INTERNAL 12/20/2009  . KNEE PAIN 11/02/2008  . LATERAL EPICONDYLITIS 06/02/2009  . 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 INSOMNIA, TRANSIENT 04/27/2009  . PERSONAL HX COLONIC POLYPS 12/20/2009  . Migraines   . Aortic aneurysm   . Dyslipidemia   . Kidney stones   . Hemorrhoids    Past Surgical History  Procedure Laterality Date  . S/p avr      with aortic root replacement 02/2009  . Cystectomy      from abdominal wall  . Tonsillectomy      reports that he has quit smoking. He has never used smokeless tobacco. He reports that he drinks about 8.4 oz of alcohol per week. He reports that he does not use illicit drugs. family history includes Diabetes in his mother; Heart disease in his father. Allergies  Allergen Reactions  . Penicillins     REACTION: childhood, reaction unknown      Review of Systems  Constitutional: Negative for fatigue.  Eyes: Negative for visual disturbance.  Respiratory: Negative for cough, chest tightness and wheezing.   Cardiovascular: Positive for  leg swelling. Negative for chest pain and palpitations.  Neurological: Negative for dizziness, syncope, weakness, light-headedness and headaches.       Objective:   Physical Exam  Constitutional: He is oriented to person, place, and time. He appears well-developed and well-nourished.  HENT:  Right Ear: External ear normal.  Left Ear: External ear normal.  Mouth/Throat: Oropharynx is clear and moist.  Eyes: Pupils are equal, round, and reactive to light.  Neck: Neck supple. No JVD present. No thyromegaly present.  Cardiovascular: Normal rate and regular rhythm.   Prominent click from previous aortic valve replacement  Pulmonary/Chest: Effort normal and breath sounds normal. No respiratory distress. He has no wheezes. He has no rales.  Musculoskeletal: He exhibits edema.  Neurological: He is alert and oriented to person, place, and time.          Assessment & Plan:  Bilateral leg edema. Suspected venous stasis related. We recommend frequent elevation and recommend compression garments with TED hose with prescription written. Avoid diuretics-if possible. Check labs with basic metabolic panel, TSH, BNP.  Hypothyroidism. Recently under replaced. Recheck TSH as above.

## 2015-03-04 NOTE — Progress Notes (Signed)
Called pt gave him the results and change the dosages of the medication

## 2015-03-04 NOTE — Progress Notes (Signed)
Pre visit review using our clinic review tool, if applicable. No additional management support is needed unless otherwise documented below in the visit note. Influenza immunization was not given due to patient refusal.  

## 2015-03-04 NOTE — Addendum Note (Signed)
Addended by: Griselda Miner E on: 03/04/2015 04:39 PM   Modules accepted: Orders, Medications

## 2015-03-04 NOTE — Progress Notes (Signed)
Pre visit review using our clinic review tool, if applicable. No additional management support is needed unless otherwise documented below in the visit note. 

## 2015-04-29 ENCOUNTER — Ambulatory Visit (INDEPENDENT_AMBULATORY_CARE_PROVIDER_SITE_OTHER): Payer: BLUE CROSS/BLUE SHIELD | Admitting: General Practice

## 2015-04-29 DIAGNOSIS — Z5181 Encounter for therapeutic drug level monitoring: Secondary | ICD-10-CM

## 2015-04-29 DIAGNOSIS — Q231 Congenital insufficiency of aortic valve: Secondary | ICD-10-CM

## 2015-04-29 LAB — POCT INR: INR: 2.1

## 2015-04-29 NOTE — Progress Notes (Signed)
Agree with Coumadin management 

## 2015-04-29 NOTE — Progress Notes (Signed)
Pre visit review using our clinic review tool, if applicable. No additional management support is needed unless otherwise documented below in the visit note. 

## 2015-06-01 ENCOUNTER — Other Ambulatory Visit: Payer: Self-pay | Admitting: Family Medicine

## 2015-06-24 ENCOUNTER — Ambulatory Visit: Payer: BLUE CROSS/BLUE SHIELD

## 2015-08-24 ENCOUNTER — Encounter: Payer: Self-pay | Admitting: Family Medicine

## 2015-08-24 ENCOUNTER — Ambulatory Visit (INDEPENDENT_AMBULATORY_CARE_PROVIDER_SITE_OTHER): Payer: BLUE CROSS/BLUE SHIELD | Admitting: Family Medicine

## 2015-08-24 DIAGNOSIS — E039 Hypothyroidism, unspecified: Secondary | ICD-10-CM

## 2015-08-24 DIAGNOSIS — M545 Low back pain, unspecified: Secondary | ICD-10-CM

## 2015-08-24 LAB — TSH: TSH: 11.72 u[IU]/mL — ABNORMAL HIGH (ref 0.35–4.50)

## 2015-08-24 MED ORDER — CYCLOBENZAPRINE HCL 10 MG PO TABS: 10.0000 mg | ORAL_TABLET | Freq: Three times a day (TID) | ORAL | Status: DC | PRN

## 2015-08-24 NOTE — Progress Notes (Signed)
Pre visit review using our clinic review tool, if applicable. No additional management support is needed unless otherwise documented below in the visit note. 

## 2015-08-24 NOTE — Progress Notes (Signed)
Subjective:    Patient ID: Samuel [name], male    DOB: [birth year], 65 y.o.   MRN: [id number]  HPI  patient seen for the following issues   acute new problem of right lumbar back pain.  Onset about 3 weeks ago after doing vacuuming. No specific injury.  Back pain is dull achy quality and worse with back flexion.  Sometimes wakes at night.  He took his wife's tramadol which did not help.  Heat helps temporarily.  Moderate pain severity. Cannot take non-steroidals secondary chronic Coumadin  denies any lower extremity numbness or weakness. No radiculopathy symptoms. No abdominal pain. Patient had similar flare-up few years ago which eventually resolved   Hypothyroidism. TSH and replace last fall. We increased his levothyroxine to 137 g daily. Compliant with therapy.   History of aortic valve replacement secondary to bicuspid aortic valve on chronic Coumadin. No recent bleeding complications.  Past Medical History  Diagnosis Date  . ONYCHOMYCOSIS 11/02/2008  . HYPOTHYROIDISM 08/26/2008  . HYPERLIPIDEMIA-MIXED 04/01/2009  . Sudden visual loss 12/25/2008  . TRANSIENT ISCHEMIC ATTACK 01/25/2009  . HEMORRHOIDS-INTERNAL 12/20/2009  . KNEE PAIN 11/02/2008  . LATERAL EPICONDYLITIS 06/02/2009  . 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 INSOMNIA, TRANSIENT 04/27/2009  . PERSONAL HX COLONIC POLYPS 12/20/2009  . Migraines   . Aortic aneurysm ([facility])   . Dyslipidemia   . Kidney stones   . Hemorrhoids    Past Surgical History  Procedure Laterality Date  . S/p avr      with aortic root replacement 02/2009  . Cystectomy      from abdominal wall  . Tonsillectomy      reports that he has quit smoking. He has never used smokeless tobacco. He reports that he drinks about 8.4 oz of alcohol per week. He reports that he does not use illicit drugs. family history includes Diabetes in his mother; Heart disease in his father. Allergies  Allergen Reactions  . Penicillins     REACTION: childhood, reaction  unknown      Review of Systems  Constitutional: Negative for fever, chills, activity change, appetite change, fatigue and unexpected weight change.  Eyes: Negative for visual disturbance.  Respiratory: Negative for cough, chest tightness and shortness of breath.   Cardiovascular: Negative for chest pain, palpitations and leg swelling.  Gastrointestinal: Negative for vomiting, abdominal pain and blood in stool.  Endocrine: Negative for polydipsia and polyuria.  Genitourinary: Negative for dysuria, hematuria and flank pain.  Musculoskeletal: Positive for back pain. Negative for joint swelling.  Neurological: Negative for dizziness, syncope, weakness, light-headedness, numbness and headaches.       Objective:   Physical Exam  Constitutional: He is oriented to person, place, and time. He appears well-developed and well-nourished. No distress.  Neck: No thyromegaly present.  Cardiovascular: Normal rate, regular rhythm and normal heart sounds.   No murmur heard. Pulmonary/Chest: Effort normal and breath sounds normal. No respiratory distress. He has no wheezes. He has no rales.  Musculoskeletal: He exhibits no edema.  Straight leg raise are negative bilaterally He has some mild right paralumbar muscle tenderness.  Neurological: He is alert and oriented to person, place, and time. He has normal reflexes. No cranial nerve deficit.  Skin: No rash noted.          Assessment & Plan:   #1 right lumbar back pain. Suspect muscular. Reviewed proper stretches. Flexeril 10 mg daily at bedtime. Continue heat or ice for symptom relief. Consider physical therapy if not improving and  1-2 weeks. Avoid nonsteroidals with chronic Coumadin use   #2 hypothyroidism. History of under replacement. Recheck TSH.   #3 chronic Coumadin use secondary to aortic valve replacement history. Patient declines INR today. He states he got off schedule and took extra doses Coumadin yesterday and prefers to wait a  couple weeks before getting this checked

## 2015-08-24 NOTE — Patient Instructions (Signed)
Low Back Sprain With Rehab A sprain is an injury in which a ligament is torn. The ligaments of the lower back are vulnerable to sprains. However, they are strong and require great force to be injured. These ligaments are important for stabilizing the spinal column. Sprains are classified into three categories. Grade 1 sprains cause pain, but the tendon is not lengthened. Grade 2 sprains include a lengthened ligament, due to the ligament being stretched or partially ruptured. With grade 2 sprains there is still function, although the function may be decreased. Grade 3 sprains involve a complete tear of the tendon or muscle, and function is usually impaired. SYMPTOMS   Severe pain in the lower back.  Sometimes, a feeling of a "pop," "snap," or tear, at the time of injury.  Tenderness and sometimes swelling at the injury site.  Uncommonly, bruising (contusion) within 48 hours of injury.  Muscle spasms in the back. CAUSES  Low back sprains occur when a force is placed on the ligaments that is greater than they can handle. Common causes of injury include:  Performing a stressful act while off-balance.  Repetitive stressful activities that involve movement of the lower back.  Direct hit (trauma) to the lower back. RISK INCREASES WITH:  Contact sports (football, wrestling).  Collisions (major skiing accidents).  Sports that require throwing or lifting (baseball, weightlifting).  Sports involving twisting of the spine (gymnastics, diving, tennis, golf).  Poor strength and flexibility.  Inadequate protection.  Previous back injury or surgery (especially fusion). PREVENTION  Wear properly fitted and padded protective equipment.  Warm up and stretch properly before activity.  Allow for adequate recovery between workouts.  Maintain physical fitness:  Strength, flexibility, and endurance.  Cardiovascular fitness.  Maintain a healthy body weight. PROGNOSIS  If treated properly,  low back sprains usually heal with non-surgical treatment. The length of time for healing depends on the severity of the injury.  RELATED COMPLICATIONS   Recurring symptoms, resulting in a chronic problem.  Chronic inflammation and pain in the low back.  Delayed healing or resolution of symptoms, especially if activity is resumed too soon.  Prolonged impairment.  Unstable or arthritic joints of the low back. TREATMENT  Treatment first involves the use of ice and medicine, to reduce pain and inflammation. The use of strengthening and stretching exercises may help reduce pain with activity. These exercises may be performed at home or with a therapist. Severe injuries may require referral to a therapist for further evaluation and treatment, such as ultrasound. Your caregiver may advise that you wear a back brace or corset, to help reduce pain and discomfort. Often, prolonged bed rest results in greater harm then benefit. Corticosteroid injections may be recommended. However, these should be reserved for the most serious cases. It is important to avoid using your back when lifting objects. At night, sleep on your back on a firm mattress, with a pillow placed under your knees. If non-surgical treatment is unsuccessful, surgery may be needed.  MEDICATION   If pain medicine is needed, nonsteroidal anti-inflammatory medicines (aspirin and ibuprofen), or other minor pain relievers (acetaminophen), are often advised.  Do not take pain medicine for 7 days before surgery.  Prescription pain relievers may be given, if your caregiver thinks they are needed. Use only as directed and only as much as you need.  Ointments applied to the skin may be helpful.  Corticosteroid injections may be given by your caregiver. These injections should be reserved for the most serious cases, because   they may only be given a certain number of times. HEAT AND COLD  Cold treatment (icing) should be applied for 10 to 15  minutes every 2 to 3 hours for inflammation and pain, and immediately after activity that aggravates your symptoms. Use ice packs or an ice massage.  Heat treatment may be used before performing stretching and strengthening activities prescribed by your caregiver, physical therapist, or athletic trainer. Use a heat pack or a warm water soak. SEEK MEDICAL CARE IF:   Symptoms get worse or do not improve in 2 to 4 weeks, despite treatment.  You develop numbness or weakness in either leg.  You lose bowel or bladder function.  Any of the following occur after surgery: fever, increased pain, swelling, redness, drainage of fluids, or bleeding in the affected area.  New, unexplained symptoms develop. (Drugs used in treatment may produce side effects.) EXERCISES  RANGE OF MOTION (ROM) AND STRETCHING EXERCISES - Low Back Sprain Most people with lower back pain will find that their symptoms get worse with excessive bending forward (flexion) or arching at the lower back (extension). The exercises that will help resolve your symptoms will focus on the opposite motion.  Your physician, physical therapist or athletic trainer will help you determine which exercises will be most helpful to resolve your lower back pain. Do not complete any exercises without first consulting with your caregiver. Discontinue any exercises which make your symptoms worse, until you speak to your caregiver. If you have pain, numbness or tingling which travels down into your buttocks, leg or foot, the goal of the therapy is for these symptoms to move closer to your back and eventually resolve. Sometimes, these leg symptoms will get better, but your lower back pain may worsen. This is often an indication of progress in your rehabilitation. Be very alert to any changes in your symptoms and the activities in which you participated in the 24 hours prior to the change. Sharing this information with your caregiver will allow him or her to most  efficiently treat your condition. These exercises may help you when beginning to rehabilitate your injury. Your symptoms may resolve with or without further involvement from your physician, physical therapist or athletic trainer. While completing these exercises, remember:   Restoring tissue flexibility helps normal motion to return to the joints. This allows healthier, less painful movement and activity.  An effective stretch should be held for at least 30 seconds.  A stretch should never be painful. You should only feel a gentle lengthening or release in the stretched tissue. FLEXION RANGE OF MOTION AND STRETCHING EXERCISES: STRETCH - Flexion, Single Knee to Chest   Lie on a firm bed or floor with both legs extended in front of you.  Keeping one leg in contact with the floor, bring your opposite knee to your chest. Hold your leg in place by either grabbing behind your thigh or at your knee.  Pull until you feel a gentle stretch in your low back. Hold __________ seconds.  Slowly release your grasp and repeat the exercise with the opposite side. Repeat __________ times. Complete this exercise __________ times per day.  STRETCH - Flexion, Double Knee to Chest  Lie on a firm bed or floor with both legs extended in front of you.  Keeping one leg in contact with the floor, bring your opposite knee to your chest.  Tense your stomach muscles to support your back and then lift your other knee to your chest. Hold your legs in   place by either grabbing behind your thighs or at your knees.  Pull both knees toward your chest until you feel a gentle stretch in your low back. Hold __________ seconds.  Tense your stomach muscles and slowly return one leg at a time to the floor. Repeat __________ times. Complete this exercise __________ times per day.  STRETCH - Low Trunk Rotation  Lie on a firm bed or floor. Keeping your legs in front of you, bend your knees so they are both pointed toward the  ceiling and your feet are flat on the floor.  Extend your arms out to the side. This will stabilize your upper body by keeping your shoulders in contact with the floor.  Gently and slowly drop both knees together to one side until you feel a gentle stretch in your low back. Hold for __________ seconds.  Tense your stomach muscles to support your lower back as you bring your knees back to the starting position. Repeat the exercise to the other side. Repeat __________ times. Complete this exercise __________ times per day  EXTENSION RANGE OF MOTION AND FLEXIBILITY EXERCISES: STRETCH - Extension, Prone on Elbows   Lie on your stomach on the floor, a bed will be too soft. Place your palms about shoulder width apart and at the height of your head.  Place your elbows under your shoulders. If this is too painful, stack pillows under your chest.  Allow your body to relax so that your hips drop lower and make contact more completely with the floor.  Hold this position for __________ seconds.  Slowly return to lying flat on the floor. Repeat __________ times. Complete this exercise __________ times per day.  RANGE OF MOTION - Extension, Prone Press Ups  Lie on your stomach on the floor, a bed will be too soft. Place your palms about shoulder width apart and at the height of your head.  Keeping your back as relaxed as possible, slowly straighten your elbows while keeping your hips on the floor. You may adjust the placement of your hands to maximize your comfort. As you gain motion, your hands will come more underneath your shoulders.  Hold this position __________ seconds.  Slowly return to lying flat on the floor. Repeat __________ times. Complete this exercise __________ times per day.  RANGE OF MOTION- Quadruped, Neutral Spine   Assume a hands and knees position on a firm surface. Keep your hands under your shoulders and your knees under your hips. You may place padding under your knees for  comfort.  Drop your head and point your tailbone toward the ground below you. This will round out your lower back like an angry cat. Hold this position for __________ seconds.  Slowly lift your head and release your tail bone so that your back sags into a large arch, like an old horse.  Hold this position for __________ seconds.  Repeat this until you feel limber in your low back.  Now, find your "sweet spot." This will be the most comfortable position somewhere between the two previous positions. This is your neutral spine. Once you have found this position, tense your stomach muscles to support your low back.  Hold this position for __________ seconds. Repeat __________ times. Complete this exercise __________ times per day.  STRENGTHENING EXERCISES - Low Back Sprain These exercises may help you when beginning to rehabilitate your injury. These exercises should be done near your "sweet spot." This is the neutral, low-back arch, somewhere between fully rounded and   fully arched, that is your least painful position. When performed in this safe range of motion, these exercises can be used for people who have either a flexion or extension based injury. These exercises may resolve your symptoms with or without further involvement from your physician, physical therapist or athletic trainer. While completing these exercises, remember:   Muscles can gain both the endurance and the strength needed for everyday activities through controlled exercises.  Complete these exercises as instructed by your physician, physical therapist or athletic trainer. Increase the resistance and repetitions only as guided.  You may experience muscle soreness or fatigue, but the pain or discomfort you are trying to eliminate should never worsen during these exercises. If this pain does worsen, stop and make certain you are following the directions exactly. If the pain is still present after adjustments, discontinue the  exercise until you can discuss the trouble with your caregiver. STRENGTHENING - Deep Abdominals, Pelvic Tilt   Lie on a firm bed or floor. Keeping your legs in front of you, bend your knees so they are both pointed toward the ceiling and your feet are flat on the floor.  Tense your lower abdominal muscles to press your low back into the floor. This motion will rotate your pelvis so that your tail bone is scooping upwards rather than pointing at your feet or into the floor. With a gentle tension and even breathing, hold this position for __________ seconds. Repeat __________ times. Complete this exercise __________ times per day.  STRENGTHENING - Abdominals, Crunches   Lie on a firm bed or floor. Keeping your legs in front of you, bend your knees so they are both pointed toward the ceiling and your feet are flat on the floor. Cross your arms over your chest.  Slightly tip your chin down without bending your neck.  Tense your abdominals and slowly lift your trunk high enough to just clear your shoulder blades. Lifting higher can put excessive stress on the lower back and does not further strengthen your abdominal muscles.  Control your return to the starting position. Repeat __________ times. Complete this exercise __________ times per day.  STRENGTHENING - Quadruped, Opposite UE/LE Lift   Assume a hands and knees position on a firm surface. Keep your hands under your shoulders and your knees under your hips. You may place padding under your knees for comfort.  Find your neutral spine and gently tense your abdominal muscles so that you can maintain this position. Your shoulders and hips should form a rectangle that is parallel with the floor and is not twisted.  Keeping your trunk steady, lift your right hand no higher than your shoulder and then your left leg no higher than your hip. Make sure you are not holding your breath. Hold this position for __________ seconds.  Continuing to keep  your abdominal muscles tense and your back steady, slowly return to your starting position. Repeat with the opposite arm and leg. Repeat __________ times. Complete this exercise __________ times per day.  STRENGTHENING - Abdominals and Quadriceps, Straight Leg Raise   Lie on a firm bed or floor with both legs extended in front of you.  Keeping one leg in contact with the floor, bend the other knee so that your foot can rest flat on the floor.  Find your neutral spine, and tense your abdominal muscles to maintain your spinal position throughout the exercise.  Slowly lift your straight leg off the floor about 6 inches for a count of   15, making sure to not hold your breath.  Still keeping your neutral spine, slowly lower your leg all the way to the floor. Repeat this exercise with each leg __________ times. Complete this exercise __________ times per day. POSTURE AND BODY MECHANICS CONSIDERATIONS - Low Back Sprain Keeping correct posture when sitting, standing or completing your activities will reduce the stress put on different body tissues, allowing injured tissues a chance to heal and limiting painful experiences. The following are general guidelines for improved posture. Your physician or physical therapist will provide you with any instructions specific to your needs. While reading these guidelines, remember:  The exercises prescribed by your provider will help you have the flexibility and strength to maintain correct postures.  The correct posture provides the best environment for your joints to work. All of your joints have less wear and tear when properly supported by a spine with good posture. This means you will experience a healthier, less painful body.  Correct posture must be practiced with all of your activities, especially prolonged sitting and standing. Correct posture is as important when doing repetitive low-stress activities (typing) as it is when doing a single heavy-load  activity (lifting). RESTING POSITIONS Consider which positions are most painful for you when choosing a resting position. If you have pain with flexion-based activities (sitting, bending, stooping, squatting), choose a position that allows you to rest in a less flexed posture. You would want to avoid curling into a fetal position on your side. If your pain worsens with extension-based activities (prolonged standing, working overhead), avoid resting in an extended position such as sleeping on your stomach. Most people will find more comfort when they rest with their spine in a more neutral position, neither too rounded nor too arched. Lying on a non-sagging bed on your side with a pillow between your knees, or on your back with a pillow under your knees will often provide some relief. Keep in mind, being in any one position for a prolonged period of time, no matter how correct your posture, can still lead to stiffness. PROPER SITTING POSTURE In order to minimize stress and discomfort on your spine, you must sit with correct posture. Sitting with good posture should be effortless for a healthy body. Returning to good posture is a gradual process. Many people can work toward this most comfortably by using various supports until they have the flexibility and strength to maintain this posture on their own. When sitting with proper posture, your ears will fall over your shoulders and your shoulders will fall over your hips. You should use the back of the chair to support your upper back. Your lower back will be in a neutral position, just slightly arched. You may place a small pillow or folded towel at the base of your lower back for  support.  When working at a desk, create an environment that supports good, upright posture. Without extra support, muscles tire, which leads to excessive strain on joints and other tissues. Keep these recommendations in mind: CHAIR:  A chair should be able to slide under your desk  when your back makes contact with the back of the chair. This allows you to work closely.  The chair's height should allow your eyes to be level with the upper part of your monitor and your hands to be slightly lower than your elbows. BODY POSITION  Your feet should make contact with the floor. If this is not possible, use a foot rest.  Keep your ears   over your shoulders. This will reduce stress on your neck and low back. INCORRECT SITTING POSTURES  If you are feeling tired and unable to assume a healthy sitting posture, do not slouch or slump. This puts excessive strain on your back tissues, causing more damage and pain. Healthier options include:  Using more support, like a lumbar pillow.  Switching tasks to something that requires you to be upright or walking.  Talking a brief walk.  Lying down to rest in a neutral-spine position. PROLONGED STANDING WHILE SLIGHTLY LEANING FORWARD  When completing a task that requires you to lean forward while standing in one place for a long time, place either foot up on a stationary 2-4 inch high object to help maintain the best posture. When both feet are on the ground, the lower back tends to lose its slight inward curve. If this curve flattens (or becomes too large), then the back and your other joints will experience too much stress, tire more quickly, and can cause pain. CORRECT STANDING POSTURES Proper standing posture should be assumed with all daily activities, even if they only take a few moments, like when brushing your teeth. As in sitting, your ears should fall over your shoulders and your shoulders should fall over your hips. You should keep a slight tension in your abdominal muscles to brace your spine. Your tailbone should point down to the ground, not behind your body, resulting in an over-extended swayback posture.  INCORRECT STANDING POSTURES  Common incorrect standing postures include a forward head, locked knees and/or an excessive  swayback. WALKING Walk with an upright posture. Your ears, shoulders and hips should all line-up. PROLONGED ACTIVITY IN A FLEXED POSITION When completing a task that requires you to bend forward at your waist or lean over a low surface, try to find a way to stabilize 3 out of 4 of your limbs. You can place a hand or elbow on your thigh or rest a knee on the surface you are reaching across. This will provide you more stability, so that your muscles do not tire as quickly. By keeping your knees relaxed, or slightly bent, you will also reduce stress across your lower back. CORRECT LIFTING TECHNIQUES DO :  Assume a wide stance. This will provide you more stability and the opportunity to get as close as possible to the object which you are lifting.  Tense your abdominals to brace your spine. Bend at the knees and hips. Keeping your back locked in a neutral-spine position, lift using your leg muscles. Lift with your legs, keeping your back straight.  Test the weight of unknown objects before attempting to lift them.  Try to keep your elbows locked down at your sides in order get the best strength from your shoulders when carrying an object.  Always ask for help when lifting heavy or awkward objects. INCORRECT LIFTING TECHNIQUES DO NOT:   Lock your knees when lifting, even if it is a small object.  Bend and twist. Pivot at your feet or move your feet when needing to change directions.  Assume that you can safely pick up even a paperclip without proper posture.   This information is not intended to replace advice given to you by your health care provider. Make sure you discuss any questions you have with your health care provider.   Document Released: 05/29/2005 Document Revised: 06/19/2014 Document Reviewed: 09/10/2008 [employer] Patient Education 2016 [employer]  

## 2015-08-25 ENCOUNTER — Telehealth: Payer: Self-pay | Admitting: *Deleted

## 2015-08-25 NOTE — Telephone Encounter (Signed)
[facility] Size     Small Medium Large Extra Extra Large    ANGIE HOGG  08/16/2015  Telephone  MRN:  [id number]   Description: 65 year old male  Provider: Lianne Cure, CMA  Department: [facility]       Reason for Call     Advice Only    Team Health         Call Documentation      Baldemar Friday, RN at 08/16/2015 8:23 AM     Status: Signed       Expand All Collapse All   Dr. Clent Ridges is aware.            Lianne Cure, CMA at 08/16/2015 8:13 AM     Status: Signed       Expand All Collapse All   FYI - Patient has appt scheduled today with Dr. Clent Ridges.            Lianne Cure, CMA at 08/16/2015 8:11 AM     Status: Signed       Expand All Collapse All   [facility] Night - Client TELEPHONE ADVICE RECORD [facility] Patient Name: Samuel [name] Gender: Male DOB: Oct 21, 1950 Age: 65 Y 11 M 26 D Return Phone Number: [phone] (Primary) Address: City/State/Zip: [place] Client [facility] Night - Client Client Site [facility] - Night Physician [name] Contact Type Call Who Is Calling Patient / Member / Family / Caregiver Call Type Triage / Clinical Relationship To Patient Self Return Phone Number [phone] (Primary) Chief Complaint Heart palpitations or irregular heartbeat Reason for Call Symptomatic / Request for Health Information Initial Comment Caller states woke up this am, HR is steady 120-122, can feel it is beating fast GOTO Facility Not Listed [facility] (per MD) PreDisposition Call Doctor Translation No Nurse Assessment Nurse: Andi Devon, RN, Fleet Contras Date/Time (Eastern Time): 08/15/2015 9:31:19 AM Confirm and document reason for call. If symptomatic, describe symptoms. You must click the next button to save text entered. ---Caller says he woke up with AM with his heart beating faster around 122 steady. He  is not exerting himself. He had a mechanical valve put in in 2010. He denies pain, sweating or difficulty breathing. This has been going on for about 45 minutes that he knows of. Has the patient traveled out of the country within the last 30 days? ---Not Applicable Does the patient have any new or worsening symptoms? ---Yes Will a triage be completed? ---Yes Related visit to physician within the last 2 weeks? ---No Does the PT have any chronic conditions? (i.e. diabetes, asthma, etc.) ---Yes List chronic conditions. ---mechanical heart valve; underactive thyroid Is this a behavioral health or substance abuse call? ---No Guidelines Guideline Title Affirmed Question Affirmed Notes Nurse Date/Time (Eastern Time) Heart Rate and Heartbeat Questions Age > 60 years (Exception: brief heart beat symptoms that went away and now feels well) Andi Devon, RN, Fleet Contras 08/15/2015 9:33:10 AM PLEASE NOTE: All timestamps contained within this report are represented as [place] Standard Time. CONFIDENTIALTY NOTICE: This fax transmission is intended only for the addressee. It contains information that is legally privileged, confidential or otherwise protected from use or disclosure. If you are not the intended recipient, you are strictly prohibited from reviewing, disclosing, copying using or disseminating any of this information or taking any action in reliance on or regarding this information. If you have received this fax in error, please notify us immediately  by telephone so that we can arrange for its return to us. Phone: [phone], Toll-Free: [phone], Fax: [phone] Page: 2 of 2 Call Id: [id number] Disp. Time Lamount Cohen(Eastern Time) Disposition Final User 08/15/2015 9:37:49 AM Paged On Call back to [facility] [place], RFleet Contras, Rachel 08/15/2015 11:19:04 AM Call Completed Renaldo FiddlerAdkins, RN, Raynelle FanningJulie 08/15/2015 9:36:51 AM See Physician within 4 Hours (or PCP triage) Yes Andi DevonLeBoeuf, RN, Abbott Paoachel Caller Understands:  Yes Disagree/Comply: Comply Care Advice Given Per Guideline SEE PHYSICIAN WITHIN 4 HOURS (or PCP triage): * IF OFFICE WILL BE CLOSED AND PCP TRIAGE REQUIRED: You may need to be seen. Your doctor will want to talk with you to decide what's best. I'll page the doctor now. If you haven't heard from the on-call doctor within 30 minutes, call again. NOTE: If PCP can't be reached, [facility] or ER. CALL BACK IF: * You become worse. CARE ADVICE given per Palpitations (Adult) guideline. Comments User: Irene Limboachel, [place], RN Date/Time Lamount Cohen(Eastern Time): 08/15/2015 9:50:25 AM called pt & gave Dr advice Referrals GO TO FACILITY OTHER - SPECIFY [facility] - ED GO TO FACILITY OTHER - SPECIFY [facility] DateTime Result/Outcome Message Type Notes Kerby NoraBedsole, Amy [phone] 08/15/2015 9:37:49 AM Paged On Call Back to Call Center Doctor Paged please call Farley LyRachel RN at [facility] [phone]. thanks! Kerby NoraBedsole, Amy 08/15/2015 9:46:35 AM Spoke with On Call - General Message Result Dr advised that pt be seen today & she recommended the urgent [facility]. If they need to send him to the ER that is very easy to do             Encounter MyChart Messages     No messages in this encounter     Routing History     Priority Sent On From To Message Type     08/16/2015 8:12 AM Lianne CureMonica B Thompson, CMA Baldemar FridaySylvia A Nimmons, RN Patient Calls      Created by     Lianne CureMonica B Thompson, CMA on 08/16/2015 08:11 AM     Visit Pharmacy     [facility] [address] [place] , [place] - [id number]     This message was sent 08/16/15 by [facility].  This not has been placed 08/25/15 as a correction.

## 2015-08-26 MED ORDER — LEVOTHYROXINE SODIUM 150 MCG PO TABS: 150.0000 ug | ORAL_TABLET | Freq: Every day | ORAL | Status: DC

## 2015-08-26 NOTE — Addendum Note (Signed)
Addended by: Tempie HoistMCNEIL, AUTUMN M on: 08/26/2015 10:48 AM   Modules accepted: Orders, Medications

## 2015-09-29 ENCOUNTER — Telehealth: Payer: Self-pay | Admitting: Family Medicine

## 2015-09-29 ENCOUNTER — Telehealth: Payer: Self-pay | Admitting: General Practice

## 2015-09-29 ENCOUNTER — Other Ambulatory Visit: Payer: Self-pay | Admitting: General Practice

## 2015-09-29 ENCOUNTER — Ambulatory Visit (INDEPENDENT_AMBULATORY_CARE_PROVIDER_SITE_OTHER): Payer: BLUE CROSS/BLUE SHIELD | Admitting: General Practice

## 2015-09-29 DIAGNOSIS — Z5181 Encounter for therapeutic drug level monitoring: Secondary | ICD-10-CM | POA: Diagnosis not present

## 2015-09-29 DIAGNOSIS — Q231 Congenital insufficiency of aortic valve: Secondary | ICD-10-CM | POA: Diagnosis not present

## 2015-09-29 LAB — POCT INR: INR: 2.3

## 2015-09-29 MED ORDER — WARFARIN SODIUM 5 MG PO TABS: ORAL_TABLET | ORAL | Status: DC

## 2015-09-29 NOTE — Telephone Encounter (Signed)
Left dosing instructions on patient's VM.  Patient also left the clinic today with an AVS and current dosing instructions.

## 2015-09-29 NOTE — Telephone Encounter (Signed)
Pharm needs directions for coumadin

## 2015-09-29 NOTE — Progress Notes (Signed)
Pre visit review using our clinic review tool, if applicable. No additional management support is needed unless otherwise documented below in the visit note. 

## 2015-09-29 NOTE — Telephone Encounter (Signed)
Called Pharmacy and patient to clarify coumadin dosage.

## 2015-12-17 ENCOUNTER — Ambulatory Visit: Payer: BLUE CROSS/BLUE SHIELD | Admitting: Family Medicine

## 2015-12-17 DIAGNOSIS — Z0289 Encounter for other administrative examinations: Secondary | ICD-10-CM

## 2016-06-10 ENCOUNTER — Other Ambulatory Visit: Payer: Self-pay | Admitting: Family Medicine

## 2016-08-10 ENCOUNTER — Telehealth: Payer: Self-pay | Admitting: Family Medicine

## 2016-08-10 MED ORDER — WARFARIN SODIUM 5 MG PO TABS: ORAL_TABLET | ORAL | 0 refills | Status: DC

## 2016-08-10 NOTE — Telephone Encounter (Signed)
Pt called in and states that he has been taking his warfarin (COUMADIN) 5 MG tablet for the past year contrary to our records indicating that he has only been dispensed 1 Rx since last April.  Patient is insisting that he has been seen and received refills since then.  There were no refills indicated on the script.  Pt is asking for pills to get him through the weekend [facility]

## 2016-08-10 NOTE — Telephone Encounter (Signed)
Pt is scheduled for Coumadin Clinic AND a follow-up with Burchette for 08/14/2016.  Pt did advise he no longer has insurance.

## 2016-08-10 NOTE — Telephone Encounter (Signed)
Coumadin refilled until seen my Cindy. Will route message to Loloindy as FYI.

## 2016-08-10 NOTE — Telephone Encounter (Signed)
Can you review this in Dr. Burchettes absence. 

## 2016-08-11 NOTE — Telephone Encounter (Signed)
Noted  

## 2016-08-14 ENCOUNTER — Ambulatory Visit (INDEPENDENT_AMBULATORY_CARE_PROVIDER_SITE_OTHER): Payer: Self-pay | Admitting: General Practice

## 2016-08-14 ENCOUNTER — Other Ambulatory Visit: Payer: Self-pay | Admitting: General Practice

## 2016-08-14 ENCOUNTER — Ambulatory Visit (INDEPENDENT_AMBULATORY_CARE_PROVIDER_SITE_OTHER): Payer: Self-pay | Admitting: Family Medicine

## 2016-08-14 ENCOUNTER — Encounter: Payer: Self-pay | Admitting: Family Medicine

## 2016-08-14 VITALS — BP 110/80 | HR 69 | Temp 98.7°F | Ht 72.0 in | Wt 236.4 lb

## 2016-08-14 DIAGNOSIS — E039 Hypothyroidism, unspecified: Secondary | ICD-10-CM

## 2016-08-14 DIAGNOSIS — Z5181 Encounter for therapeutic drug level monitoring: Secondary | ICD-10-CM

## 2016-08-14 DIAGNOSIS — N401 Enlarged prostate with lower urinary tract symptoms: Secondary | ICD-10-CM

## 2016-08-14 DIAGNOSIS — Q231 Congenital insufficiency of aortic valve: Secondary | ICD-10-CM

## 2016-08-14 LAB — POCT INR: INR: 1.5

## 2016-08-14 MED ORDER — WARFARIN SODIUM 5 MG PO TABS: ORAL_TABLET | ORAL | 0 refills | Status: DC

## 2016-08-14 MED ORDER — TAMSULOSIN HCL 0.4 MG PO CAPS: 0.4000 mg | ORAL_CAPSULE | Freq: Every day | ORAL | 11 refills | Status: DC

## 2016-08-14 NOTE — Patient Instructions (Signed)
Pre visit review using our clinic review tool, if applicable. No additional management support is needed unless otherwise documented below in the visit note. 

## 2016-08-14 NOTE — Progress Notes (Signed)
Pre visit review using our clinic review tool, if applicable. No additional management support is needed unless otherwise documented below in the visit note. 

## 2016-08-14 NOTE — Patient Instructions (Signed)
Set up physical exam for late June or July.

## 2016-08-14 NOTE — Progress Notes (Signed)
Subjective:     Patient ID: Samuel [name], male   DOB: 01/25/1951, 66 y.o.   MRN: [id number]  HPI  Patient seen for medical follow-up. He has history of aortic valve replacement on chronic Coumadin. He had INR today 1.5. He states he has made some dietary changes and consuming more greens recently. He has hypothyroidism on levothyroxine and is overdue for labs. He has no coverage for lab at this time and is requesting that we defer this until June or July when he has Medicare part B.   He states he is taking his medication consistently  History of BPH. He's had some recent progressive nocturia with slow stream. He is requesting trial going back on Flomax. No recent burning with urination.  Past Medical History:  Diagnosis Date  . Aortic aneurysm (HCC)   . 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 Dyslipidemia   . Hemorrhoids   . HEMORRHOIDS-INTERNAL 12/20/2009  . HYPERLIPIDEMIA-MIXED 04/01/2009  . HYPOTHYROIDISM 08/26/2008  . INSOMNIA, TRANSIENT 04/27/2009  . Kidney stones   . KNEE PAIN 11/02/2008  . LATERAL EPICONDYLITIS 06/02/2009  . Migraines   . ONYCHOMYCOSIS 11/02/2008  . PERSONAL HX COLONIC POLYPS 12/20/2009  . Sudden visual loss 12/25/2008  . TRANSIENT ISCHEMIC ATTACK 01/25/2009   Past Surgical History:  Procedure Laterality Date  . CYSTECTOMY     from abdominal wall  . 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 TONSILLECTOMY      reports that he has quit smoking. He has never used smokeless tobacco. He reports that he drinks about 8.4 oz of alcohol per week . He reports that he does not use drugs. family history includes Diabetes in his mother; Heart disease in his father. Allergies  Allergen Reactions  . Penicillins     REACTION: childhood, reaction unknown     Review of Systems  Constitutional: Negative for fatigue.  Eyes: Negative for visual disturbance.  Respiratory: Negative for cough, chest tightness and shortness of breath.   Cardiovascular: Negative for chest  pain, palpitations and leg swelling.  Neurological: Negative for dizziness, syncope, weakness, light-headedness and headaches.       Objective:   Physical Exam  Constitutional: He appears well-developed and well-nourished. No distress.  Neck: Neck supple. No thyromegaly present.  Cardiovascular: Normal rate and regular rhythm.   Pulmonary/Chest: Effort normal and breath sounds normal. No respiratory distress. He has no wheezes. He has no rales.  Musculoskeletal: He exhibits no edema.  Lymphadenopathy:    He has no cervical adenopathy.       Assessment:     #1 hypothyroidism.  Patient is overdue for labs but requesting deferral for 3 months until he has coverage  #2 history of BPH  #3 history of aortic valve replacement on chronic Coumadin    Plan:     -Continue close follow-up with Coumadin clinic -We'll plan follow-up in 3 months when he has coverage and plan to get TSH at that time -Refill Flomax 0.4 mg one by mouth daily at bedtime  Kristian CoveyBruce W Jeovany Huitron MD [facility]

## 2016-09-26 ENCOUNTER — Other Ambulatory Visit: Payer: Self-pay | Admitting: Family Medicine

## 2016-10-09 ENCOUNTER — Ambulatory Visit: Payer: Medicare Other

## 2016-11-20 ENCOUNTER — Other Ambulatory Visit: Payer: Self-pay | Admitting: Family Medicine

## 2016-12-20 ENCOUNTER — Ambulatory Visit (INDEPENDENT_AMBULATORY_CARE_PROVIDER_SITE_OTHER): Payer: Medicare Other | Admitting: General Practice

## 2016-12-20 ENCOUNTER — Other Ambulatory Visit: Payer: Self-pay | Admitting: General Practice

## 2016-12-20 DIAGNOSIS — Q231 Congenital insufficiency of aortic valve: Secondary | ICD-10-CM | POA: Diagnosis not present

## 2016-12-20 DIAGNOSIS — Z5181 Encounter for therapeutic drug level monitoring: Secondary | ICD-10-CM

## 2016-12-20 DIAGNOSIS — G459 Transient cerebral ischemic attack, unspecified: Secondary | ICD-10-CM | POA: Diagnosis not present

## 2016-12-20 LAB — POCT INR: INR: 2.4

## 2016-12-20 MED ORDER — WARFARIN SODIUM 5 MG PO TABS: ORAL_TABLET | ORAL | 0 refills | Status: DC

## 2016-12-20 NOTE — Patient Instructions (Signed)
Pre visit review using our clinic review tool, if applicable. No additional management support is needed unless otherwise documented below in the visit note. 

## 2017-01-23 ENCOUNTER — Other Ambulatory Visit: Payer: Self-pay | Admitting: Family Medicine

## 2017-01-31 ENCOUNTER — Ambulatory Visit: Payer: Medicare Other

## 2017-03-26 ENCOUNTER — Other Ambulatory Visit: Payer: Self-pay | Admitting: General Practice

## 2017-03-26 ENCOUNTER — Ambulatory Visit (INDEPENDENT_AMBULATORY_CARE_PROVIDER_SITE_OTHER): Payer: Medicare Other | Admitting: General Practice

## 2017-03-26 DIAGNOSIS — Z7901 Long term (current) use of anticoagulants: Secondary | ICD-10-CM

## 2017-03-26 DIAGNOSIS — Q231 Congenital insufficiency of aortic valve: Secondary | ICD-10-CM | POA: Diagnosis not present

## 2017-03-26 DIAGNOSIS — Z5181 Encounter for therapeutic drug level monitoring: Secondary | ICD-10-CM

## 2017-03-26 LAB — POCT INR: INR: 3.1

## 2017-03-26 MED ORDER — WARFARIN SODIUM 5 MG PO TABS: ORAL_TABLET | ORAL | 0 refills | Status: DC

## 2017-03-26 NOTE — Patient Instructions (Signed)
Pre visit review using our clinic review tool, if applicable. No additional management support is needed unless otherwise documented below in the visit note. 

## 2017-03-27 ENCOUNTER — Ambulatory Visit (INDEPENDENT_AMBULATORY_CARE_PROVIDER_SITE_OTHER): Payer: Medicare Other | Admitting: Family Medicine

## 2017-03-27 ENCOUNTER — Encounter: Payer: Self-pay | Admitting: Family Medicine

## 2017-03-27 VITALS — BP 108/70 | HR 72 | Temp 98.9°F | Wt 245.9 lb

## 2017-03-27 DIAGNOSIS — M545 Low back pain, unspecified: Secondary | ICD-10-CM

## 2017-03-27 DIAGNOSIS — E039 Hypothyroidism, unspecified: Secondary | ICD-10-CM | POA: Diagnosis not present

## 2017-03-27 DIAGNOSIS — Q231 Congenital insufficiency of aortic valve: Secondary | ICD-10-CM

## 2017-03-27 MED ORDER — LEVOTHYROXINE SODIUM 150 MCG PO TABS: 150.0000 ug | ORAL_TABLET | Freq: Every day | ORAL | 1 refills | Status: DC

## 2017-03-27 MED ORDER — CYCLOBENZAPRINE HCL 10 MG PO TABS: 10.0000 mg | ORAL_TABLET | Freq: Three times a day (TID) | ORAL | 0 refills | Status: DC | PRN

## 2017-03-27 NOTE — Patient Instructions (Signed)

## 2017-03-27 NOTE — Progress Notes (Signed)
Subjective:     Patient ID: Samuel [name], male   DOB: September 03, 1950, 66 y.o.   MRN: [id number]  HPI Patient seen today for the following several items  New acute problem of left lumbar back pain. Onset about a week ago. Woke up with pain. No known injury. He has had similar flareups the past. Pain is described as "achy" and left lower lumbar without radiculitis symptoms. No dysuria. No fevers or chills. Better with movement and worse with prolonged periods of standing or sitting. Increased stiffness in the mornings. He is on chronic Coumadin and does not take nonsteroidals.  Used some ice without improvement.  Patient has history of bicuspid aortic valve and had aortic valve replacement several years ago. He basically did not have good insurance coverage and has been lost to cardiology follow-up. Denies any recent chest pains or dyspnea.  Hypothyroidism on replacement. He is overdue for labs. Takes levothyroxine 150 g daily.  Past Medical History:  Diagnosis Date  . Aortic aneurysm (HCC)   . 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 Dyslipidemia   . Hemorrhoids   . HEMORRHOIDS-INTERNAL 12/20/2009  . HYPERLIPIDEMIA-MIXED 04/01/2009  . HYPOTHYROIDISM 08/26/2008  . INSOMNIA, TRANSIENT 04/27/2009  . Kidney stones   . KNEE PAIN 11/02/2008  . LATERAL EPICONDYLITIS 06/02/2009  . Migraines   . ONYCHOMYCOSIS 11/02/2008  . PERSONAL HX COLONIC POLYPS 12/20/2009  . Sudden visual loss 12/25/2008  . TRANSIENT ISCHEMIC ATTACK 01/25/2009   Past Surgical History:  Procedure Laterality Date  . CYSTECTOMY     from abdominal wall  . 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 TONSILLECTOMY      reports that he has quit smoking. He has never used smokeless tobacco. He reports that he drinks about 8.4 oz of alcohol per week . He reports that he does not use drugs. family history includes Diabetes in his mother; Heart disease in his father. Allergies  Allergen Reactions  . Penicillins     REACTION:  childhood, reaction unknown     Review of Systems  Constitutional: Negative for chills, fatigue and fever.  Eyes: Negative for visual disturbance.  Respiratory: Negative for cough, chest tightness and shortness of breath.   Cardiovascular: Negative for chest pain, palpitations and leg swelling.  Gastrointestinal: Negative for abdominal pain.  Genitourinary: Negative for dysuria.  Musculoskeletal: Positive for back pain.  Neurological: Negative for dizziness, syncope, weakness, light-headedness and headaches.       Objective:   Physical Exam  Constitutional: He appears well-developed and well-nourished.  Cardiovascular: Normal rate and regular rhythm.   Pulmonary/Chest: Effort normal and breath sounds normal. No respiratory distress. He has no wheezes. He has no rales.  Musculoskeletal: He exhibits no edema.  Straight leg raises are negative bilaterally  Neurological:  Full-strength lower extremities. Only trace reflexes knee and ankle bilaterally.       Assessment:     #1 acute lumbar back pain left-sided. No radiculitis symptoms. Nonfocal neuro exam. Suspect musculoskeletal  #2 hypothyroidism overdue for labs  #3 history of aortic valve replacement-on chronic anticoagulation with Coumadin    Plan:     -Ordered future labs for TSH -Recommend setting up Medicare wellness visit -Set up cardiology referral. We recommended he be followed by them ongoing with his history of valve replacement. -Review some extension stretches for his back and continued heat or ice for symptom relief -Consider topical sports cream -Short-term only use of Flexeril 10 mg daily at bedtime -Consider physical  therapy if back pain not improving over the next couple weeks  Kristian Covey MD [facility]

## 2017-04-23 ENCOUNTER — Encounter: Payer: Self-pay | Admitting: Internal Medicine

## 2017-04-23 ENCOUNTER — Ambulatory Visit (INDEPENDENT_AMBULATORY_CARE_PROVIDER_SITE_OTHER): Payer: Medicare Other | Admitting: Internal Medicine

## 2017-04-23 VITALS — BP 126/80 | HR 68 | Ht 72.0 in | Wt 249.0 lb

## 2017-04-23 DIAGNOSIS — E782 Mixed hyperlipidemia: Secondary | ICD-10-CM

## 2017-04-23 DIAGNOSIS — I359 Nonrheumatic aortic valve disorder, unspecified: Secondary | ICD-10-CM

## 2017-04-23 NOTE — Patient Instructions (Signed)
Your physician recommends that you continue on your current medications as directed. Please refer to the Current Medication list given to you today.  Your physician has requested that you have an echocardiogram. Echocardiography is a painless test that uses sound waves to create images of your heart. It provides your doctor with information about the size and shape of your heart and how well your heart's chambers and valves are working. This procedure takes approximately one hour. There are no restrictions for this procedure.  Your physician recommends that you return for lab work when you come for echocardiogram (LIPIDS)  Your physician wants you to follow-up in: 2 year with Dr. Tenny Crawoss.  You will receive a reminder letter in the mail two months in advance. If you don't receive a letter, please call our office to schedule the follow-up appointment.

## 2017-04-23 NOTE — Progress Notes (Signed)
Cardiology Office Note   Date:  04/23/2017   ID:  Samuel [name], DOB 11/16/1950, Samuel [name]  PCP:  Kristian CoveyBurchette, Bruce W, MD  Cardiologist:   Dietrich PatesPaula Dinorah Masullo, MD    Pt referred by B Burchette for f/u of AV prosthesis   History of Present Illness: Samuel Officereter A [name] is a 66 y.o. male with a history of  AV dz  (bicuspid AV ) and asc aortic aneurysm   I saw him around 2010  He underwent AVR (ST Jude mechanical )with root replacememnt at that time  He had not followed up in cardiology   The pt says he is feeling good  Denies CP  Breathing is OK  No SOB  No palpitations NO dizziness    Current Meds  Medication Sig  . levothyroxine (SYNTHROID, LEVOTHROID) 150 MCG tablet Take 1 tablet (150 mcg total) by mouth daily.  Marland Kitchen. warfarin (COUMADIN) 5 MG tablet Take as directed by anticoagulation clinic.     Allergies:   Penicillins   Past Medical History:  Diagnosis Date  . Aortic aneurysm (HCC)   . 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 Dyslipidemia   . Hemorrhoids   . HEMORRHOIDS-INTERNAL 12/20/2009  . HYPERLIPIDEMIA-MIXED 04/01/2009  . HYPOTHYROIDISM 08/26/2008  . INSOMNIA, TRANSIENT 04/27/2009  . Kidney stones   . KNEE PAIN 11/02/2008  . LATERAL EPICONDYLITIS 06/02/2009  . Migraines   . ONYCHOMYCOSIS 11/02/2008  . PERSONAL HX COLONIC POLYPS 12/20/2009  . Sudden visual loss 12/25/2008  . TRANSIENT ISCHEMIC ATTACK 01/25/2009    Past Surgical History:  Procedure Laterality Date  . CYSTECTOMY     from abdominal wall  . 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 TONSILLECTOMY       Social History:  The patient  reports that he has quit smoking. he has never used smokeless tobacco. He reports that he drinks about 8.4 oz of alcohol per week. He reports that he does not use drugs.   Family History:  The patient's family history includes Diabetes in his mother; Heart disease in his father.    ROS:  Please see the history of present illness. All other systems are reviewed and  Negative  to the above problem except as noted.    PHYSICAL EXAM: VS:  BP 126/80   Pulse 68   Ht 6' (1.829 m)   Wt 249 lb (112.9 kg)   BMI 33.77 kg/m   GEN: Well nourished, well developed, in no acute distress  HEENT: normal  Neck: no JVD, carotid bruits, or masses Cardiac: RRR;  Crisp valve sounds  ,no edema  Respiratory:  clear to auscultation bilaterally, normal work of breathing GI: soft, nontender, nondistended, + BS  No hepatomegaly  MS: no deformity Moving all extremities   Skin: warm and dry, no rash Neuro:  Strength and sensation are intact Psych: euthymic mood, full affect   EKG:  EKG is ordered today.  SR 68 bpm  Nonspecific ST changes     Lipid Panel    Component Value Date/Time   CHOL 174 11/11/2014 0923   TRIG 96.0 11/11/2014 0923   HDL 32.90 (L) 11/11/2014 0923   CHOLHDL 5 11/11/2014 0923   VLDL 19.2 11/11/2014 0923   LDLCALC 122 (H) 11/11/2014 0923   LDLDIRECT 137.9 06/21/2012 0851      Wt Readings from Last 3 Encounters:  04/23/17 249 lb (112.9 kg)  03/27/17 245 lb 14.4 oz (111.5 kg)  08/14/16 236 lb 6.4 oz (  107.2 kg)      ASSESSMENT AND PLAN:  1  AV disease  Valve sounds are good  He is asymptomatic I would recomm echo to reeval valve as well as aortic root    2  HCM  Will set up for fasting lipids when he returns for echo  F?U in 2 years in clinic   Current medicines are reviewed at length with the patient today.  The patient does not have concerns regarding medicines.  Signed, Dietrich PatesPaula Leoda Smithhart, MD  04/23/2017 2:03 PM    [facility] [address], [place], [place]  [address] Phone: [phone]; Fax: [phone]

## 2017-04-27 ENCOUNTER — Ambulatory Visit (HOSPITAL_COMMUNITY): Payer: Medicare Other | Attending: Cardiology

## 2017-04-27 ENCOUNTER — Other Ambulatory Visit: Payer: Medicare Other | Admitting: *Deleted

## 2017-04-27 ENCOUNTER — Other Ambulatory Visit: Payer: Self-pay

## 2017-04-27 DIAGNOSIS — E782 Mixed hyperlipidemia: Secondary | ICD-10-CM | POA: Diagnosis not present

## 2017-04-27 DIAGNOSIS — G459 Transient cerebral ischemic attack, unspecified: Secondary | ICD-10-CM | POA: Diagnosis not present

## 2017-04-27 DIAGNOSIS — I359 Nonrheumatic aortic valve disorder, unspecified: Secondary | ICD-10-CM

## 2017-04-27 DIAGNOSIS — Z952 Presence of prosthetic heart valve: Secondary | ICD-10-CM | POA: Diagnosis not present

## 2017-04-27 DIAGNOSIS — Z87891 Personal history of nicotine dependence: Secondary | ICD-10-CM | POA: Diagnosis not present

## 2017-04-27 DIAGNOSIS — I719 Aortic aneurysm of unspecified site, without rupture: Secondary | ICD-10-CM | POA: Insufficient documentation

## 2017-04-27 DIAGNOSIS — Z48812 Encounter for surgical aftercare following surgery on the circulatory system: Secondary | ICD-10-CM | POA: Insufficient documentation

## 2017-04-27 DIAGNOSIS — E785 Hyperlipidemia, unspecified: Secondary | ICD-10-CM | POA: Diagnosis not present

## 2017-04-27 DIAGNOSIS — Z8249 Family history of ischemic heart disease and other diseases of the circulatory system: Secondary | ICD-10-CM | POA: Diagnosis not present

## 2017-04-28 LAB — LIPID PANEL
CHOL/HDL RATIO: 4.1 ratio (ref 0.0–5.0)
Cholesterol, Total: 188 mg/dL (ref 100–199)
HDL: 46 mg/dL (ref >39)
LDL Calculated: 126 mg/dL — ABNORMAL HIGH (ref 0–99)
Triglycerides: 82 mg/dL (ref 0–149)
VLDL Cholesterol Cal: 16 mg/dL (ref 5–40)

## 2017-05-07 ENCOUNTER — Ambulatory Visit: Payer: Medicare Other

## 2017-05-09 ENCOUNTER — Ambulatory Visit (INDEPENDENT_AMBULATORY_CARE_PROVIDER_SITE_OTHER): Payer: Medicare Other | Admitting: General Practice

## 2017-05-09 DIAGNOSIS — Z7901 Long term (current) use of anticoagulants: Secondary | ICD-10-CM

## 2017-05-09 DIAGNOSIS — Q231 Congenital insufficiency of aortic valve: Secondary | ICD-10-CM | POA: Diagnosis not present

## 2017-05-09 LAB — POCT INR: INR: 2.8

## 2017-05-09 NOTE — Patient Instructions (Addendum)
Pre visit review using our clinic review tool, if applicable. No additional management support is needed unless otherwise documented below in the visit note.  Continue to take 2 tablets all days except 1 1/2 tablets on Wed and Saturdays.  Re-check in 6 weeks.  Encouraged patient to keep diet steady.

## 2017-06-18 ENCOUNTER — Ambulatory Visit (INDEPENDENT_AMBULATORY_CARE_PROVIDER_SITE_OTHER): Payer: Medicare Other | Admitting: General Practice

## 2017-06-18 DIAGNOSIS — Q231 Congenital insufficiency of aortic valve: Secondary | ICD-10-CM | POA: Diagnosis not present

## 2017-06-18 DIAGNOSIS — Z7901 Long term (current) use of anticoagulants: Secondary | ICD-10-CM

## 2017-06-18 LAB — POCT INR: INR: 2.2

## 2017-06-18 NOTE — Patient Instructions (Addendum)
Pre visit review using our clinic review tool, if applicable. No additional management support is needed unless otherwise documented below in the visit note.  Continue to take 2 tablets all days except 1 1/2 tablets on Wed and Saturdays.  Re-check in 6 weeks.  Encouraged patient to keep diet steady. 

## 2017-06-20 ENCOUNTER — Ambulatory Visit: Payer: Medicare Other

## 2017-06-30 ENCOUNTER — Other Ambulatory Visit: Payer: Self-pay | Admitting: Family Medicine

## 2017-07-02 ENCOUNTER — Other Ambulatory Visit: Payer: Self-pay | Admitting: General Practice

## 2017-07-02 MED ORDER — WARFARIN SODIUM 5 MG PO TABS: ORAL_TABLET | ORAL | 0 refills | Status: DC

## 2017-07-10 ENCOUNTER — Ambulatory Visit (INDEPENDENT_AMBULATORY_CARE_PROVIDER_SITE_OTHER): Payer: Medicare Other | Admitting: Family Medicine

## 2017-07-10 ENCOUNTER — Encounter: Payer: Self-pay | Admitting: Family Medicine

## 2017-07-10 VITALS — BP 110/84 | HR 65 | Temp 98.5°F | Wt 247.8 lb

## 2017-07-10 DIAGNOSIS — E039 Hypothyroidism, unspecified: Secondary | ICD-10-CM | POA: Diagnosis not present

## 2017-07-10 DIAGNOSIS — M19041 Primary osteoarthritis, right hand: Secondary | ICD-10-CM | POA: Diagnosis not present

## 2017-07-10 DIAGNOSIS — M19042 Primary osteoarthritis, left hand: Secondary | ICD-10-CM | POA: Diagnosis not present

## 2017-07-10 DIAGNOSIS — R3915 Urgency of urination: Secondary | ICD-10-CM

## 2017-07-10 LAB — TSH: TSH: 1.32 u[IU]/mL (ref 0.35–4.50)

## 2017-07-10 NOTE — Progress Notes (Signed)
Subjective:     Patient ID: Samuel [name], male   DOB: [birth year], 67 y.o.   MRN: [id number]  HPI Here to discuss multiple issues as follows  Hypothyroidism treated levothyroxine. Overdue for lab work. Compliant with therapy.  History of aortic valve replacement secondary to bicuspid aortic valve. We had recently encouraged him to follow-up with cardiology and he had repeat echocardiogram which was unremarkable.On chronic Coumadin therapy. No recent chest pains or dyspnea  Complains of insomnia secondary to nocturia. He denies any slow stream or obstructive symptoms. He thinks is more urgency. No significant caffeine consumption. Occasional alcohol use at night. Sometimes gets up 3-4 times per night. Reluctant to take medications for this.  Complains of "arthritis "pain in his hands. Multiple joints including DIP, PIP, and occasionally MCP joints. Has not noted any warmth or erythema. Sometimes has stiffness. Occasional bilateral wrist pain. No family history of rheumatoid arthritis.  Past Medical History:  Diagnosis Date  . Aortic aneurysm (HCC)   . 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 Dyslipidemia   . Hemorrhoids   . HEMORRHOIDS-INTERNAL 12/20/2009  . HYPERLIPIDEMIA-MIXED 04/01/2009  . HYPOTHYROIDISM 08/26/2008  . INSOMNIA, TRANSIENT 04/27/2009  . Kidney stones   . KNEE PAIN 11/02/2008  . LATERAL EPICONDYLITIS 06/02/2009  . Migraines   . ONYCHOMYCOSIS 11/02/2008  . PERSONAL HX COLONIC POLYPS 12/20/2009  . Sudden visual loss 12/25/2008  . TRANSIENT ISCHEMIC ATTACK 01/25/2009   Past Surgical History:  Procedure Laterality Date  . CYSTECTOMY     from abdominal wall  . 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 TONSILLECTOMY      reports that he has quit smoking. he has never used smokeless tobacco. He reports that he drinks about 8.4 oz of alcohol per week. He reports that he does not use drugs. family history includes Diabetes in his mother; Heart disease in his  father. Allergies  Allergen Reactions  . Penicillins     REACTION: childhood, reaction unknown       Review of Systems  Constitutional: Negative for chills, fatigue and fever.  Eyes: Negative for visual disturbance.  Respiratory: Negative for cough, chest tightness and shortness of breath.   Cardiovascular: Negative for chest pain, palpitations and leg swelling.  Genitourinary: Positive for urgency. Negative for decreased urine volume, dysuria and hematuria.  Neurological: Negative for dizziness, syncope, weakness, light-headedness and headaches.  Psychiatric/Behavioral: Positive for sleep disturbance.       Objective:   Physical Exam  Constitutional: He is oriented to person, place, and time. He appears well-developed and well-nourished.  HENT:  Right Ear: External ear normal.  Left Ear: External ear normal.  Mouth/Throat: Oropharynx is clear and moist.  Eyes: Pupils are equal, round, and reactive to light.  Neck: Neck supple. No thyromegaly present.  Cardiovascular: Normal rate and regular rhythm.  Pulmonary/Chest: Effort normal and breath sounds normal. No respiratory distress. He has no wheezes. He has no rales.  Musculoskeletal: He exhibits no edema.  Joints are examined. No warmth or erythema. No visible swelling.  Neurological: He is alert and oriented to person, place, and time.       Assessment:     #1 hypothyroidism  #2 insomnia secondary to urinary urgency. 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

## 2017-07-10 NOTE — Patient Instructions (Signed)
Set up Medicare Wellness visit with Susan. 

## 2017-07-11 ENCOUNTER — Other Ambulatory Visit: Payer: Self-pay | Admitting: Family Medicine

## 2017-07-11 ENCOUNTER — Other Ambulatory Visit: Payer: Self-pay | Admitting: *Deleted

## 2017-07-11 MED ORDER — LEVOTHYROXINE SODIUM 150 MCG PO TABS: 150.0000 ug | ORAL_TABLET | Freq: Every day | ORAL | 3 refills | Status: DC

## 2017-07-26 DIAGNOSIS — H6523 Chronic serous otitis media, bilateral: Secondary | ICD-10-CM | POA: Diagnosis not present

## 2017-07-26 DIAGNOSIS — H6993 Unspecified Eustachian tube disorder, bilateral: Secondary | ICD-10-CM | POA: Diagnosis not present

## 2017-07-30 ENCOUNTER — Ambulatory Visit: Payer: Medicare Other

## 2017-08-06 ENCOUNTER — Ambulatory Visit (INDEPENDENT_AMBULATORY_CARE_PROVIDER_SITE_OTHER): Payer: Medicare Other | Admitting: General Practice

## 2017-08-06 DIAGNOSIS — Q231 Congenital insufficiency of aortic valve: Secondary | ICD-10-CM | POA: Diagnosis not present

## 2017-08-06 DIAGNOSIS — Z7901 Long term (current) use of anticoagulants: Secondary | ICD-10-CM

## 2017-08-06 LAB — POCT INR: INR: 2.3

## 2017-08-06 NOTE — Patient Instructions (Addendum)
Pre visit review using our clinic review tool, if applicable. No additional management support is needed unless otherwise documented below in the visit note.  Continue to take 2 tablets all days except 1 1/2 tablets on Wed and Saturdays.  Re-check in 6 weeks.  Encouraged patient to keep diet steady. 

## 2017-09-17 ENCOUNTER — Ambulatory Visit (INDEPENDENT_AMBULATORY_CARE_PROVIDER_SITE_OTHER): Payer: Medicare Other | Admitting: General Practice

## 2017-09-17 ENCOUNTER — Other Ambulatory Visit: Payer: Self-pay | Admitting: General Practice

## 2017-09-17 DIAGNOSIS — Q231 Congenital insufficiency of aortic valve: Secondary | ICD-10-CM | POA: Diagnosis not present

## 2017-09-17 DIAGNOSIS — Z7901 Long term (current) use of anticoagulants: Secondary | ICD-10-CM | POA: Diagnosis not present

## 2017-09-17 LAB — POCT INR: INR: 2.5

## 2017-09-17 MED ORDER — WARFARIN SODIUM 5 MG PO TABS: ORAL_TABLET | ORAL | 0 refills | Status: DC

## 2017-09-17 NOTE — Patient Instructions (Addendum)
Pre visit review using our clinic review tool, if applicable. No additional management support is needed unless otherwise documented below in the visit note.  Continue to take 2 tablets all days except 1 1/2 tablets on Wed and Saturdays.  Re-check in 6 weeks.  Encouraged patient to keep diet steady. 

## 2017-10-09 ENCOUNTER — Other Ambulatory Visit: Payer: Self-pay | Admitting: Family Medicine

## 2017-10-15 ENCOUNTER — Other Ambulatory Visit: Payer: Self-pay | Admitting: General Practice

## 2017-10-15 MED ORDER — WARFARIN SODIUM 5 MG PO TABS: ORAL_TABLET | ORAL | 0 refills | Status: DC

## 2017-10-16 ENCOUNTER — Ambulatory Visit (INDEPENDENT_AMBULATORY_CARE_PROVIDER_SITE_OTHER): Payer: Medicare Other | Admitting: Family Medicine

## 2017-10-16 ENCOUNTER — Encounter: Payer: Self-pay | Admitting: Family Medicine

## 2017-10-16 VITALS — BP 110/70 | HR 69 | Temp 98.9°F | Wt 246.6 lb

## 2017-10-16 DIAGNOSIS — R1032 Left lower quadrant pain: Secondary | ICD-10-CM | POA: Diagnosis not present

## 2017-10-16 DIAGNOSIS — K5792 Diverticulitis of intestine, part unspecified, without perforation or abscess without bleeding: Secondary | ICD-10-CM

## 2017-10-16 DIAGNOSIS — Z7901 Long term (current) use of anticoagulants: Secondary | ICD-10-CM

## 2017-10-16 LAB — CBC WITH DIFFERENTIAL/PLATELET
Basophils Absolute: 0 10*3/uL (ref 0.0–0.1)
Basophils Relative: 0.3 % (ref 0.0–3.0)
EOS PCT: 0.1 % (ref 0.0–5.0)
Eosinophils Absolute: 0 10*3/uL (ref 0.0–0.7)
HCT: 44.6 % (ref 39.0–52.0)
Hemoglobin: 15.7 g/dL (ref 13.0–17.0)
LYMPHS ABS: 1.4 10*3/uL (ref 0.7–4.0)
Lymphocytes Relative: 13 % (ref 12.0–46.0)
MCHC: 35.2 g/dL (ref 30.0–36.0)
MCV: 91.8 fl (ref 78.0–100.0)
MONO ABS: 1.2 10*3/uL — AB (ref 0.1–1.0)
Monocytes Relative: 11.8 % (ref 3.0–12.0)
NEUTROS ABS: 7.9 10*3/uL — AB (ref 1.4–7.7)
NEUTROS PCT: 74.8 % (ref 43.0–77.0)
PLATELETS: 194 10*3/uL (ref 150.0–400.0)
RBC: 4.86 Mil/uL (ref 4.22–5.81)
RDW: 13.2 % (ref 11.5–15.5)
WBC: 10.5 10*3/uL (ref 4.0–10.5)

## 2017-10-16 LAB — POCT INR: INR: 2.8

## 2017-10-16 MED ORDER — HYDROCODONE-ACETAMINOPHEN 5-325 MG PO TABS: 1.0000 | ORAL_TABLET | Freq: Four times a day (QID) | ORAL | 0 refills | Status: DC | PRN

## 2017-10-16 MED ORDER — CIPROFLOXACIN HCL 500 MG PO TABS: 500.0000 mg | ORAL_TABLET | Freq: Two times a day (BID) | ORAL | 0 refills | Status: DC

## 2017-10-16 MED ORDER — METRONIDAZOLE 500 MG PO TABS: 500.0000 mg | ORAL_TABLET | Freq: Three times a day (TID) | ORAL | 0 refills | Status: DC

## 2017-10-16 NOTE — Progress Notes (Signed)
  Subjective:     Patient ID: Samuel [name], male   DOB: 1950/09/24, 67 y.o.   MRN: [id number]  HPI  Patient seen with abdominal pain which started this past weekend. He had colonoscopy a few years ago which showed extensive diverticulosis in the sigmoid colon. He denies any prior history of diverticulitis. His pain he states is actually better today but was fairly severe last night. He denies any nausea or vomiting. Slightly diminished appetite.  He has history of reported allergy to penicillin. Is on Coumadin with history of aortic valve replacement.  Past Medical History:  Diagnosis Date  . Aortic aneurysm (HCC)   . 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 Dyslipidemia   . Hemorrhoids   . HEMORRHOIDS-INTERNAL 12/20/2009  . HYPERLIPIDEMIA-MIXED 04/01/2009  . HYPOTHYROIDISM 08/26/2008  . INSOMNIA, TRANSIENT 04/27/2009  . Kidney stones   . KNEE PAIN 11/02/2008  . LATERAL EPICONDYLITIS 06/02/2009  . Migraines   . ONYCHOMYCOSIS 11/02/2008  . PERSONAL HX COLONIC POLYPS 12/20/2009  . Sudden visual loss 12/25/2008  . TRANSIENT ISCHEMIC ATTACK 01/25/2009   Past Surgical History:  Procedure Laterality Date  . CYSTECTOMY     from abdominal wall  . 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 TONSILLECTOMY      reports that he has quit smoking. He has never used smokeless tobacco. He reports that he drinks about 8.4 oz of alcohol per week. He reports that he does not use drugs. family history includes Diabetes in his mother; Heart disease in his father. Allergies  Allergen Reactions  . Penicillins     REACTION: childhood, reaction unknown    Review of Systems  Constitutional: Negative for fever.  Respiratory: Negative for shortness of breath.   Cardiovascular: Negative for chest pain.  Gastrointestinal: Positive for abdominal pain. Negative for abdominal distention, blood in stool, diarrhea, nausea and vomiting.       Objective:   Physical Exam  Constitutional: He appears  well-developed and well-nourished.  HENT:  Mouth/Throat: Oropharynx is clear and moist.  Cardiovascular: Normal rate.  Pulmonary/Chest: Effort normal and breath sounds normal.  Abdominal: Soft. Bowel sounds are normal. He exhibits no distension and no mass. There is tenderness. There is no rebound.   tenderness left lower quadrant to deep palpation.       Assessment:     Abdominal pain left lower quadrant. Suspect acute diverticulitis  Patient is on chronic Coumadin secondary to aortic valve replacement    Plan:     -INR today 2.8 -Start Cipro 500 mg twice a day for 10 days and Flagyl 500 mg 3 times a day for 10 days -Decrease Coumadin from his usual 10 mg dose today to 5 mg -Follow-up immediately for any recurrent vomiting, increased fever, or worsening abdominal pain.  -Will check CBC with differential  Kristian Covey MD [facility] at [facility]

## 2017-10-16 NOTE — Patient Instructions (Addendum)
Diverticulitis Diverticulitis is infection or inflammation of small pouches (diverticula) in the colon that form due to a condition called diverticulosis. Diverticula can trap stool (feces) and bacteria, causing infection and inflammation. Diverticulitis may cause severe stomach pain and diarrhea. It may lead to tissue damage in the colon that causes bleeding. The diverticula may also burst (rupture) and cause infected stool to enter other areas of the abdomen. Complications of diverticulitis can include:  Bleeding.  Severe infection.  Severe pain.  Rupture (perforation) of the colon.  Blockage (obstruction) of the colon.  What are the causes? This condition is caused by stool becoming trapped in the diverticula, which allows bacteria to grow in the diverticula. This leads to inflammation and infection. What increases the risk? You are more likely to develop this condition if:  You have diverticulosis. The risk for diverticulosis increases if: ? You are overweight or obese. ? You use tobacco products. ? You do not get enough exercise.  You eat a diet that does not include enough fiber. High-fiber foods include fruits, vegetables, beans, nuts, and whole grains.  What are the signs or symptoms? Symptoms of this condition may include:  Pain and tenderness in the abdomen. The pain is normally located on the left side of the abdomen, but it may occur in other areas.  Fever and chills.  Bloating.  Cramping.  Nausea.  Vomiting.  Changes in bowel routines.  Blood in your stool.  How is this diagnosed? This condition is diagnosed based on:  Your medical history.  A physical exam.  Tests to make sure there is nothing else causing your condition. These tests may include: ? Blood tests. ? Urine tests. ? Imaging tests of the abdomen, including X-rays, ultrasounds, MRIs, or CT scans.  How is this treated? Most cases of this condition are mild and can be treated at home.  Treatment may include:  Taking over-the-counter pain medicines.  Following a clear liquid diet.  Taking antibiotic medicines by mouth.  Rest.  More severe cases may need to be treated at a hospital. Treatment may include:  Not eating or drinking.  Taking prescription pain medicine.  Receiving antibiotic medicines through an IV tube.  Receiving fluids and nutrition through an IV tube.  Surgery.  When your condition is under control, your health care provider may recommend that you have a colonoscopy. This is an exam to look at the entire large intestine. During the exam, a lubricated, bendable tube is inserted into the anus and then passed into the rectum, colon, and other parts of the large intestine. A colonoscopy can show how severe your diverticula are and whether something else may be causing your symptoms. Follow these instructions at home: Medicines  Take over-the-counter and prescription medicines only as told by your health care provider. These include fiber supplements, probiotics, and stool softeners.  If you were prescribed an antibiotic medicine, take it as told by your health care provider. Do not stop taking the antibiotic even if you start to feel better.  Do not drive or use heavy machinery while taking prescription pain medicine. General instructions  Follow a full liquid diet or another diet as directed by your health care provider. After your symptoms improve, your health care provider may tell you to change your diet. He or she may recommend that you eat a diet that contains at least 25 g (25 grams) of fiber daily. Fiber makes it easier to pass stool. Healthy sources of fiber include: ? Berries. One cup  contains 4-8 grams of fiber. ? Beans or lentils. One half cup contains 5-8 grams of fiber. ? Green vegetables. One cup contains 4 grams of fiber.  Exercise for at least 30 minutes, 3 times each week. You should exercise hard enough to raise your heart rate and  break a sweat.  Keep all follow-up visits as told by your health care provider. This is important. You may need a colonoscopy. Contact a health care provider if:  Your pain does not improve.  You have a hard time drinking or eating food.  Your bowel movements do not return to normal. Get help right away if:  Your pain gets worse.  Your symptoms do not get better with treatment.  Your symptoms suddenly get worse.  You have a fever.  You vomit more than one time.  You have stools that are bloody, black, or tarry. Summary  Diverticulitis is infection or inflammation of small pouches (diverticula) in the colon that form due to a condition called diverticulosis. Diverticula can trap stool (feces) and bacteria, causing infection and inflammation.  You are at higher risk for this condition if you have diverticulosis and you eat a diet that does not include enough fiber.  Most cases of this condition are mild and can be treated at home. More severe cases may need to be treated at a hospital.  When your condition is under control, your health care provider may recommend that you have an exam called a colonoscopy. This exam can show how severe your diverticula are and whether something else may be causing your symptoms. This information is not intended to replace advice given to you by your health care provider. Make sure you discuss any questions you have with your health care provider. Document Released: 03/08/2005 Document Revised: 07/01/2016 Document Reviewed: 07/01/2016 Elsevier Interactive Patient Education  2018 [employer].  Take only ONE Coumadin tonight.

## 2017-10-18 ENCOUNTER — Telehealth: Payer: Self-pay | Admitting: Family Medicine

## 2017-10-18 NOTE — Telephone Encounter (Signed)
Copied from CRM [id number]. Topic: Quick Communication - See Telephone Encounter >> Oct 18, 2017  3:27 PM Samuel [name], NT wrote: CRM for notification. See Telephone encounter for: 10/18/17. Pt would like a call regarding the abdominal pain he was seen for on Tuesday. Requesting Dr. Caryl Never to call.

## 2017-10-19 NOTE — Telephone Encounter (Signed)
I spoke with patient   his abdominal pain is much better.  He has not had a good bowel movement in several days but is also eating less with his diverticulitis flare. No fever.

## 2017-10-19 NOTE — Telephone Encounter (Signed)
Patient states he has had "some normal bowel movements.".  He would like to know if he can try an enema?

## 2017-10-22 ENCOUNTER — Ambulatory Visit (INDEPENDENT_AMBULATORY_CARE_PROVIDER_SITE_OTHER): Payer: Medicare Other | Admitting: General Practice

## 2017-10-22 DIAGNOSIS — Q231 Congenital insufficiency of aortic valve: Secondary | ICD-10-CM

## 2017-10-22 DIAGNOSIS — Z7901 Long term (current) use of anticoagulants: Secondary | ICD-10-CM | POA: Diagnosis not present

## 2017-10-22 LAB — POCT INR: INR: 1.8

## 2017-10-22 NOTE — Patient Instructions (Signed)
Pre visit review using our clinic review tool, if applicable. No additional management support is needed unless otherwise documented below in the visit note. 

## 2017-10-29 ENCOUNTER — Ambulatory Visit: Payer: Medicare Other | Admitting: Family Medicine

## 2017-10-29 ENCOUNTER — Ambulatory Visit: Payer: Medicare Other

## 2017-10-29 ENCOUNTER — Ambulatory Visit (INDEPENDENT_AMBULATORY_CARE_PROVIDER_SITE_OTHER): Payer: Medicare Other | Admitting: General Practice

## 2017-10-29 DIAGNOSIS — Z7901 Long term (current) use of anticoagulants: Secondary | ICD-10-CM

## 2017-10-29 DIAGNOSIS — Q231 Congenital insufficiency of aortic valve: Secondary | ICD-10-CM

## 2017-10-29 LAB — POCT INR: INR: 2.1 (ref 2.0–3.0)

## 2017-10-29 NOTE — Patient Instructions (Addendum)
Pre visit review using our clinic review tool, if applicable. No additional management support is needed unless otherwise documented below in the visit note.  Continue to take 2 tablets daily except 1 1/2 tablets on Wednesday and Saturdays.  Re-check in 4 weeks.  Encouraged patient to keep diet steady.

## 2017-11-26 ENCOUNTER — Ambulatory Visit: Payer: Medicare Other

## 2017-11-26 ENCOUNTER — Ambulatory Visit (INDEPENDENT_AMBULATORY_CARE_PROVIDER_SITE_OTHER): Payer: Medicare Other | Admitting: General Practice

## 2017-11-26 DIAGNOSIS — Q231 Congenital insufficiency of aortic valve: Secondary | ICD-10-CM

## 2017-11-26 DIAGNOSIS — Z7901 Long term (current) use of anticoagulants: Secondary | ICD-10-CM

## 2017-11-26 LAB — POCT INR: INR: 4.4 — AB (ref 2.0–3.0)

## 2017-11-26 NOTE — Patient Instructions (Addendum)
Pre visit review using our clinic review tool, if applicable. No additional management support is needed unless otherwise documented below in the visit note.  Hold coumadin today and tomorrow and then continue to take 2 tablets daily except 1 1/2 tablets on Wednesday and Saturdays.  Re-check in 2 weeks.  Encouraged patient to keep diet steady.

## 2017-12-10 ENCOUNTER — Ambulatory Visit (INDEPENDENT_AMBULATORY_CARE_PROVIDER_SITE_OTHER): Payer: Medicare Other | Admitting: General Practice

## 2017-12-10 DIAGNOSIS — Z7901 Long term (current) use of anticoagulants: Secondary | ICD-10-CM

## 2017-12-10 DIAGNOSIS — Q231 Congenital insufficiency of aortic valve: Secondary | ICD-10-CM

## 2017-12-10 LAB — POCT INR: INR: 3.4 — AB (ref 2.0–3.0)

## 2017-12-10 NOTE — Patient Instructions (Addendum)
Pre visit review using our clinic review tool, if applicable. No additional management support is needed unless otherwise documented below in the visit note.  Hold coumadin today and then change dosage and take 2 tablets daily except 1 1/2 tablets on Monday/Wednesday and Saturdays.  Re-check in 4 weeks.  Encouraged patient to keep diet steady.

## 2018-01-07 ENCOUNTER — Ambulatory Visit (INDEPENDENT_AMBULATORY_CARE_PROVIDER_SITE_OTHER): Payer: Medicare Other | Admitting: General Practice

## 2018-01-07 DIAGNOSIS — Q231 Congenital insufficiency of aortic valve: Secondary | ICD-10-CM

## 2018-01-07 DIAGNOSIS — Z7901 Long term (current) use of anticoagulants: Secondary | ICD-10-CM | POA: Diagnosis not present

## 2018-01-07 LAB — POCT INR: INR: 3.2 — AB (ref 2.0–3.0)

## 2018-01-07 NOTE — Patient Instructions (Addendum)
Pre visit review using our clinic review tool, if applicable. No additional management support is needed unless otherwise documented below in the visit note.  Hold coumadin today and then change dosage and take 2 tablets daily except 1 1/2 tablets on Monday/Wednesday/Fridays and Saturdays.  Re-check in 4 weeks.  Encouraged patient to keep diet steady.

## 2018-01-26 DIAGNOSIS — M1712 Unilateral primary osteoarthritis, left knee: Secondary | ICD-10-CM | POA: Diagnosis not present

## 2018-01-26 DIAGNOSIS — M1711 Unilateral primary osteoarthritis, right knee: Secondary | ICD-10-CM | POA: Diagnosis not present

## 2018-02-04 ENCOUNTER — Ambulatory Visit (INDEPENDENT_AMBULATORY_CARE_PROVIDER_SITE_OTHER): Payer: Medicare Other | Admitting: General Practice

## 2018-02-04 DIAGNOSIS — Z7901 Long term (current) use of anticoagulants: Secondary | ICD-10-CM | POA: Diagnosis not present

## 2018-02-04 DIAGNOSIS — Q231 Congenital insufficiency of aortic valve: Secondary | ICD-10-CM

## 2018-02-04 LAB — POCT INR: INR: 3 (ref 2.0–3.0)

## 2018-02-04 NOTE — Patient Instructions (Addendum)
Pre visit review using our clinic review tool, if applicable. No additional management support is needed unless otherwise documented below in the visit note.  Continue to take 2 tablets daily except 1 1/2 tablets on Monday/Wednesday/Fridays and Saturdays.  Re-check in 4 weeks.  Encouraged patient to keep diet steady.

## 2018-02-13 ENCOUNTER — Encounter: Payer: Self-pay | Admitting: Family Medicine

## 2018-02-13 ENCOUNTER — Ambulatory Visit (INDEPENDENT_AMBULATORY_CARE_PROVIDER_SITE_OTHER): Payer: Medicare Other | Admitting: Family Medicine

## 2018-02-13 VITALS — BP 120/80 | HR 68 | Temp 98.5°F | Wt 248.0 lb

## 2018-02-13 DIAGNOSIS — M109 Gout, unspecified: Secondary | ICD-10-CM

## 2018-02-13 MED ORDER — PREDNISONE 20 MG PO TABS: ORAL_TABLET | ORAL | 0 refills | Status: DC

## 2018-02-13 MED ORDER — HYDROCODONE-ACETAMINOPHEN 5-325 MG PO TABS: 1.0000 | ORAL_TABLET | Freq: Four times a day (QID) | ORAL | 0 refills | Status: DC | PRN

## 2018-02-13 NOTE — Progress Notes (Signed)
  Subjective:     Patient ID: Samuel [name], male   DOB: 06/23/1950, 67 y.o.   MRN: [id number]  HPI Patient seen with acute left toe pain. He has diffuse swelling and redness. Acute onset yesterday.  No injury. He states he had similar episode about 20 years ago. He had severe pain last night which interfered with sleep. No fevers or chills. He has history of aortic valve surgery and is on chronic Coumadin.  Does recall eating some chicken livers over the weekend. No regular beer use.  Past Medical History:  Diagnosis Date  . Aortic aneurysm (HCC)   . 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 Dyslipidemia   . Hemorrhoids   . HEMORRHOIDS-INTERNAL 12/20/2009  . HYPERLIPIDEMIA-MIXED 04/01/2009  . HYPOTHYROIDISM 08/26/2008  . INSOMNIA, TRANSIENT 04/27/2009  . Kidney stones   . KNEE PAIN 11/02/2008  . LATERAL EPICONDYLITIS 06/02/2009  . Migraines   . ONYCHOMYCOSIS 11/02/2008  . PERSONAL HX COLONIC POLYPS 12/20/2009  . Sudden visual loss 12/25/2008  . TRANSIENT ISCHEMIC ATTACK 01/25/2009   Past Surgical History:  Procedure Laterality Date  . CYSTECTOMY     from abdominal wall  . 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 TONSILLECTOMY      reports that he has quit smoking. He has never used smokeless tobacco. He reports that he drinks about 14.0 standard drinks of alcohol per week. He reports that he does not use drugs. family history includes Diabetes in his mother; Heart disease in his father. Allergies  Allergen Reactions  . Penicillins     REACTION: childhood, reaction unknown     Review of Systems  Constitutional: Negative for chills and fever.       Objective:   Physical Exam  Constitutional: He appears well-developed and well-nourished.  Cardiovascular: Normal rate.  Pulmonary/Chest: Effort normal and breath sounds normal.  Musculoskeletal:  Patient has some redness warmth swelling tenderness left metatarsophalangeal joint and interphalangeal joint. No visible breaks  in the skin. Good distal pulses. Feet are warm to touch. Good capillary refill.  No erythematous streaks.       Assessment:     Acute left toe pain. Suspect acute gout    Plan:     -prednisone - take 60 mg daily for 2 days then 40 mg daily for 4 days -Discussed low purine diet -Wrote for limited Vicodin 5 mg #12 one to 2 tablets every 6 hours for severe pain -Touch base if symptoms not promptly improving over the next few days -stressed importance of staying well hydrated -Consider baseline uric acid and prophylaxis if having more future occurrences  Kristian Covey MD [facility] at [facility]

## 2018-02-13 NOTE — Patient Instructions (Signed)

## 2018-02-21 ENCOUNTER — Telehealth: Payer: Self-pay | Admitting: Family Medicine

## 2018-02-21 NOTE — Telephone Encounter (Signed)
Copied from CRM [id number]. Topic: Inquiry >> Feb 21, 2018  2:09 PM Alexander BergeronBarksdale, Samuel B wrote: Reason for CRM: pt states he is having another episode of gait and is wanting to know what the next step is; contact pt to advise

## 2018-02-22 ENCOUNTER — Ambulatory Visit: Payer: Medicare Other | Admitting: Family Medicine

## 2018-02-22 ENCOUNTER — Encounter: Payer: Self-pay | Admitting: Family Medicine

## 2018-02-22 ENCOUNTER — Ambulatory Visit (INDEPENDENT_AMBULATORY_CARE_PROVIDER_SITE_OTHER): Payer: Medicare Other | Admitting: Family Medicine

## 2018-02-22 DIAGNOSIS — M109 Gout, unspecified: Secondary | ICD-10-CM | POA: Diagnosis not present

## 2018-02-22 MED ORDER — ALLOPURINOL 100 MG PO TABS: ORAL_TABLET | ORAL | 6 refills | Status: DC

## 2018-02-22 MED ORDER — COLCHICINE 0.6 MG PO TABS: 0.6000 mg | ORAL_TABLET | Freq: Every day | ORAL | 1 refills | Status: DC

## 2018-02-22 MED ORDER — HYDROCODONE-ACETAMINOPHEN 5-325 MG PO TABS: 1.0000 | ORAL_TABLET | Freq: Four times a day (QID) | ORAL | 0 refills | Status: DC | PRN

## 2018-02-22 MED ORDER — PREDNISONE 20 MG PO TABS: ORAL_TABLET | ORAL | 0 refills | Status: DC

## 2018-02-22 NOTE — Patient Instructions (Signed)
Start the prednisone today  Start the Colchicine one tablet daily until follow up.  Wait two weeks to start the Allopurinol.

## 2018-02-22 NOTE — Telephone Encounter (Signed)
I called the pt and informed him of the message below.  Patient stated he is on the way here now instead for the 8:15am appt.  Message sent to Dr Caryl NeverBurchette as Lorain ChildesFYI.

## 2018-02-22 NOTE — Telephone Encounter (Signed)
Refill prednisone once and set up follow up to discuss possible prophylaxis.

## 2018-02-22 NOTE — Progress Notes (Signed)
  Subjective:     Patient ID: Samuel [name], male   DOB: 05/22/1951, 67 y.o.   MRN: [id number]  HPI Patient seen with recurrent left great toe pain. Recent flareup for acute gout. This did resolve after starting prednisone after being off a few days pain has returned. No recent dietary changes. He had gout about 15 or 20 years ago but none since then until recent flareup. He is interested in looking at possible prophylactic options.  Past Medical History:  Diagnosis Date  . Aortic aneurysm (HCC)   . 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 Dyslipidemia   . Hemorrhoids   . HEMORRHOIDS-INTERNAL 12/20/2009  . HYPERLIPIDEMIA-MIXED 04/01/2009  . HYPOTHYROIDISM 08/26/2008  . INSOMNIA, TRANSIENT 04/27/2009  . Kidney stones   . KNEE PAIN 11/02/2008  . LATERAL EPICONDYLITIS 06/02/2009  . Migraines   . ONYCHOMYCOSIS 11/02/2008  . PERSONAL HX COLONIC POLYPS 12/20/2009  . Sudden visual loss 12/25/2008  . TRANSIENT ISCHEMIC ATTACK 01/25/2009   Past Surgical History:  Procedure Laterality Date  . CYSTECTOMY     from abdominal wall  . 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 TONSILLECTOMY      reports that he has quit smoking. He has never used smokeless tobacco. He reports that he drinks about 14.0 standard drinks of alcohol per week. He reports that he does not use drugs. family history includes Diabetes in his mother; Heart disease in his father. Allergies  Allergen Reactions  . Penicillins     REACTION: childhood, reaction unknown     Review of Systems  Constitutional: Negative for chills and fever.       Objective:   Physical Exam  Constitutional: He appears well-developed and well-nourished.  Cardiovascular: Normal rate and regular rhythm.  Musculoskeletal:  Left great toe reveals some swelling and tenderness and warmth involving MTP joint just distal to this region. No ulcerations.       Assessment:     Recurrent gout. Has now had 2 severe flareups in recent weeks     Plan:     -start back prednisone and taper -Start colchicine 0.6 mg 1 daily -Wait at least 2 weeks until after acute flare has subsided and then start allopurinol 100 mg daily for 2 weeks and then 200 mg daily for 2 weeks then 300 mg daily -Bring back for reassessment 2 months. Check uric acid then with goal less than 6  Samuel CoveyBruce W Burchette MD [facility]

## 2018-02-25 NOTE — Telephone Encounter (Signed)
Called patient and he stated that the Prednisone helped some the 2nd time but the symptoms are not improving and he is asking for a refill of Prednisone so that he can help calm down the inflammation. He is very concerned that he will not stop the pain in time before it gets worse again.  Please advise.

## 2018-02-25 NOTE — Telephone Encounter (Signed)
Spoke with patient.  OK to refill the prednisone once.

## 2018-02-25 NOTE — Telephone Encounter (Signed)
Unfortunately, we do not have a generic equivalent of Colcrys.  We are somewhat limited what we can use b/o his chronic anti-coagulation.

## 2018-02-25 NOTE — Telephone Encounter (Signed)
Called patient and stated that the 1st dose of prednisone helped but the 2nd dose is not helping. Colchicine is almost $300 per 30 days. Is there anything cheaper?? Please advise.  Patient stated that he did not want to run out of Prednisone also.

## 2018-02-25 NOTE — Telephone Encounter (Signed)
Pt called and is still having issue with gout. He would like a call back. Please advise

## 2018-02-26 ENCOUNTER — Other Ambulatory Visit: Payer: Self-pay

## 2018-02-26 MED ORDER — PREDNISONE 20 MG PO TABS: ORAL_TABLET | ORAL | 0 refills | Status: DC

## 2018-02-26 NOTE — Telephone Encounter (Signed)
Prednisone has been sent to the pharmacy for the patient.

## 2018-03-04 ENCOUNTER — Ambulatory Visit: Payer: Medicare Other | Admitting: General Practice

## 2018-03-07 ENCOUNTER — Telehealth: Payer: Self-pay | Admitting: Family Medicine

## 2018-03-07 ENCOUNTER — Other Ambulatory Visit: Payer: Self-pay | Admitting: Family Medicine

## 2018-03-07 NOTE — Telephone Encounter (Signed)
Copied from CRM [id number]. Topic: Quick Communication - See Telephone Encounter >> Mar 07, 2018  1:35 PM Baldo Daub L wrote: CRM for notification. See Telephone encounter for: 03/07/18.  Pt states he is on a complicated medication regimen for gout and that he is out of his predniSONE (DELTASONE) 20 MG tablet and thinks that he may need more because his toes are getting sore again.  Pt states that he is also supposed to start allopurinol (ZYLOPRIM) 100 MG tablet today and the directions are confusing to him.  Pt would like someone to call and let him know how to take this. Pt can be reached at [phone]

## 2018-03-07 NOTE — Telephone Encounter (Signed)
Patient called again after 5pm to get clarification on his earlier call to office.  He had not heard back and stated that his big toe is in pain and still swelling.  Informed patient that a note would be sent for doctor or nurse to call him back as soon as possible the next day.  Please advise.  CB# [phone]

## 2018-03-08 ENCOUNTER — Other Ambulatory Visit: Payer: Self-pay

## 2018-03-08 MED ORDER — PREDNISONE 20 MG PO TABS: ORAL_TABLET | ORAL | 0 refills | Status: DC

## 2018-03-08 NOTE — Telephone Encounter (Signed)
Refill OK

## 2018-03-08 NOTE — Telephone Encounter (Signed)
Prednisone has been sent to the pharmacy

## 2018-03-08 NOTE — Telephone Encounter (Signed)
Will send refill for the Prednisone.

## 2018-03-08 NOTE — Telephone Encounter (Signed)
Last OV 02/22/18, No future OV  Last filled 02/26/18, # 14 with 0 refills  Please see telephone message from yesterday

## 2018-03-08 NOTE — Telephone Encounter (Signed)
May refill prednisone once. Not sure what is confusing about the allopurinol.  Dosing should be clear on the bottle.

## 2018-03-08 NOTE — Telephone Encounter (Signed)
Please see message and advise.  Thank you. ° °

## 2018-03-08 NOTE — Telephone Encounter (Signed)
Refill has been sent.  °

## 2018-03-08 NOTE — Telephone Encounter (Signed)
Called patient and LMOVM to return call wanting to know his symptoms and what the instructions are on the Allopurinol prescription.  Ok for [facility] to Discuss results / PCP / recommendations / Schedule patient  CRM Created.

## 2018-03-12 DIAGNOSIS — Z8719 Personal history of other diseases of the digestive system: Secondary | ICD-10-CM

## 2018-03-12 HISTORY — DX: Personal history of other diseases of the digestive system: Z87.19

## 2018-03-20 ENCOUNTER — Ambulatory Visit: Payer: Self-pay | Admitting: *Deleted

## 2018-03-20 ENCOUNTER — Encounter: Payer: Self-pay | Admitting: Family Medicine

## 2018-03-20 ENCOUNTER — Ambulatory Visit (INDEPENDENT_AMBULATORY_CARE_PROVIDER_SITE_OTHER): Payer: Medicare Other | Admitting: Family Medicine

## 2018-03-20 VITALS — BP 122/79 | HR 71 | Temp 98.5°F | Resp 16 | Ht 72.0 in | Wt 247.4 lb

## 2018-03-20 DIAGNOSIS — R3989 Other symptoms and signs involving the genitourinary system: Secondary | ICD-10-CM

## 2018-03-20 DIAGNOSIS — K5792 Diverticulitis of intestine, part unspecified, without perforation or abscess without bleeding: Secondary | ICD-10-CM

## 2018-03-20 LAB — POCT URINALYSIS DIPSTICK
BILIRUBIN UA: NEGATIVE
GLUCOSE UA: NEGATIVE
Ketones, UA: NEGATIVE
LEUKOCYTES UA: NEGATIVE
Nitrite, UA: NEGATIVE
Protein, UA: NEGATIVE
Spec Grav, UA: 1.015 (ref 1.010–1.025)
UROBILINOGEN UA: 1 U/dL
pH, UA: 7.5 (ref 5.0–8.0)

## 2018-03-20 MED ORDER — METRONIDAZOLE 500 MG PO TABS: 500.0000 mg | ORAL_TABLET | Freq: Three times a day (TID) | ORAL | 0 refills | Status: AC

## 2018-03-20 MED ORDER — METRONIDAZOLE 500 MG PO TABS: 500.0000 mg | ORAL_TABLET | Freq: Three times a day (TID) | ORAL | 0 refills | Status: DC

## 2018-03-20 MED ORDER — SULFAMETHOXAZOLE-TRIMETHOPRIM 800-160 MG PO TABS: 1.0000 | ORAL_TABLET | Freq: Two times a day (BID) | ORAL | 0 refills | Status: DC

## 2018-03-20 MED ORDER — HYDROCODONE-ACETAMINOPHEN 5-325 MG PO TABS: 1.0000 | ORAL_TABLET | Freq: Four times a day (QID) | ORAL | 0 refills | Status: DC | PRN

## 2018-03-20 MED ORDER — SULFAMETHOXAZOLE-TRIMETHOPRIM 800-160 MG PO TABS: 1.0000 | ORAL_TABLET | Freq: Two times a day (BID) | ORAL | 0 refills | Status: AC

## 2018-03-20 NOTE — Telephone Encounter (Signed)
I saw pt in clinic today

## 2018-03-20 NOTE — Telephone Encounter (Signed)
FYI

## 2018-03-20 NOTE — Patient Instructions (Signed)
Get your PT/INR checked early next week.

## 2018-03-20 NOTE — Telephone Encounter (Signed)
Patient is having burning with voiding, soreness in prostate and rectum area. No blood noted from those areas.He did have some bleeding with hemorrhoids yesterday. He woke up with lower generalized abdominal pain this morning which has resolved but soreness and burning set in the listed areas as the day has progressed. No fever/N/V. 2 loose stools this morning and is voiding but still feeling the urge afterwards. PCP not available. Appointment made for closest availability this afternoon.   Appointment with Dr. Milinda Cave at 3:45.  Reason for Disposition . [1] MILD-MODERATE pain AND [2] constant AND [3] present > 2 hours  Answer Assessment - Initial Assessment Questions 1. LOCATION: "Where does it hurt?"      Abdominal pain lower earlier today. After 2 bowel movements that were loose, no more abdominal pain but now a burning sensation after voiding with the urethra, rectum and prostate. 2. RADIATION: "Does the pain shoot anywhere else?" (e.g., chest, back)     no 3. ONSET: "When did the pain begin?" (Minutes, hours or days ago)      Woke up this morning with the abdominal pain and has worsened over the day.  4. SUDDEN: "Gradual or sudden onset?"     Gradual and getting worse. 5. PATTERN "Does the pain come and go, or is it constant?"    - If constant: "Is it getting better, staying the same, or worsening?"      (Note: Constant means the pain never goes away completely; most serious pain is constant and it progresses)     - If intermittent: "How long does it last?" "Do you have pain now?"     (Note: Intermittent means the pain goes away completely between bouts)     Constant with a pulsa 6. SEVERITY: "How bad is the pain?"  (e.g., Scale 1-10; mild, moderate, or severe)    - MILD (1-3): doesn't interfere with normal activities, abdomen soft and not tender to touch     - MODERATE (4-7): interferes with normal activities or awakens from sleep, tender to touch     - SEVERE (8-10): excruciating pain,  doubled over, unable to do any normal activities       moderate 7. RECURRENT SYMPTOM: "Have you ever had this type of abdominal pain before?" If so, ask: "When was the last time?" and "What happened that time?"      no 8. CAUSE: "What do you think is causing the abdominal pain?"     no 9. RELIEVING/AGGRAVATING FACTORS: "What makes it better or worse?" (e.g., movement, antacids, bowel movement)    nothing 10. OTHER SYMPTOMS: "Has there been any vomiting, diarrhea, constipation, or urine problems?"       No, but crampy and loose diarrhea.  Protocols used: ABDOMINAL PAIN - MALE-A-AH

## 2018-03-20 NOTE — Progress Notes (Signed)
OFFICE VISIT  03/20/2018   CC:  Chief Complaint  Patient presents with  . Urinary Retention    burning, frequency    HPI:    Patient is a 67 y.o.  male who presents for pain in the lower abdominal/genitourinary region.  He has a bit of a hard time describing location exactly. Onset this morning: some urethral burning and pain in bladder and prostate area and into rectal area.  Lower abdomen hurting as well, diffusely.  No n/v. Feels like he needs to urinate, this relieves some sx's briefly and then he has urge to urinate again soon, no signif feeling of urinary retention or full bladder.  Feels most relief when sitting on toilet and "letting everything just dribble".   Two looser than normal stools today, some excessive flatulence today.   No melena.  No BRBPR today. No fevers.   No urethral d/c. Denies any hx of acute prostatitis or diverticulitis to his knowledge.  He says he knows he has diverticulosis.  Past Medical History:  Diagnosis Date  . Aortic aneurysm (HCC)   . 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 Dyslipidemia   . Hemorrhoids   . HEMORRHOIDS-INTERNAL 12/20/2009  . HYPERLIPIDEMIA-MIXED 04/01/2009  . HYPOTHYROIDISM 08/26/2008  . INSOMNIA, TRANSIENT 04/27/2009  . Kidney stones   . KNEE PAIN 11/02/2008  . LATERAL EPICONDYLITIS 06/02/2009  . Migraines   . ONYCHOMYCOSIS 11/02/2008  . PERSONAL HX COLONIC POLYPS 12/20/2009  . Sudden visual loss 12/25/2008  . TRANSIENT ISCHEMIC ATTACK 01/25/2009    Past Surgical History:  Procedure Laterality Date  . CYSTECTOMY     from abdominal wall  . 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 TONSILLECTOMY      Outpatient Medications Prior to Visit  Medication Sig Dispense Refill  . allopurinol (ZYLOPRIM) 100 MG tablet Take one tablet daily for two weeks, then take two tablets daily for two weeks, then take three tablets daily. 90 tablet 6  . levothyroxine (SYNTHROID, LEVOTHROID) 150 MCG tablet Take 1 tablet (150 mcg total)  by mouth daily. 90 tablet 3  . warfarin (COUMADIN) 5 MG tablet Take 2 tablets by mouth daily or AS DIRECTED BY ANTICOAGULATION CLINIC. 180 tablet 0  . HYDROcodone-acetaminophen (NORCO/VICODIN) 5-325 MG tablet Take 1-2 tablets by mouth every 6 (six) hours as needed for moderate pain. 12 tablet 0  . colchicine 0.6 MG tablet Take 1 tablet (0.6 mg total) by mouth daily. (Patient not taking: Reported on 03/20/2018) 90 tablet 1  . predniSONE (DELTASONE) 20 MG tablet TAKE 3 TABLETS BY MOUTH EVERY DAY FOR 2 DAYS THEN 2 TABLETS EVERY DAY FOR 4 DAYS (Patient not taking: Reported on 03/20/2018) 14 tablet 0  . predniSONE (DELTASONE) 20 MG tablet Take three tablets once daily for two days and then two tablets daily for 4 days. (Patient not taking: Reported on 03/20/2018) 14 tablet 0   No facility-administered medications prior to visit.     Allergies  Allergen Reactions  . Shellfish Allergy Anaphylaxis  . Penicillins     REACTION: childhood, reaction unknown    ROS As per HPI  PE: Blood pressure 122/79, pulse 71, temperature 98.5 F (36.9 C), temperature source Oral, resp. rate 16, height 6' (1.829 m), weight 247 lb 6 oz (112.2 kg), SpO2 93 %. Body mass index is 33.55 kg/m.  Gen: Alert, well appearing.  Patient is oriented to person, place, time, and situation. AFFECT: pleasant, lucid thought and speech. ONG:EXBM: no injection, icteris, swelling,  or exudate.  EOMI, PERRLA. Mouth: lips without lesion/swelling.  Oral mucosa pink and moist. Oropharynx without erythema, exudate, or swelling.  CV: RRR, 2-3/6 systolic murmur with mechanical S2, no diastolic murmur, no rub/gallop. Chest is clear, no wheezing or rales. Normal symmetric air entry throughout both lung fields. No chest wall deformities or tenderness. Abd: soft, non-distended.  BS normal.  Significant LLQ tenderness without guarding or rebound. Milder midline lower and R lower abd TTP.  No mass, bruit, or HSM. Rectal: no tender hemorrhoids, no  bleeding, only mild tenderness of prostate gland upon palpation and it did make him feel like he had to urinate---but I was only able to get the tip of my finger to the inferior-most portion of the prostate gland.  No nodule or fluctuance or induration.  LABS:  Lab Results  Component Value Date   WBC 10.5 10/16/2017   HGB 15.7 10/16/2017   HCT 44.6 10/16/2017   MCV 91.8 10/16/2017   PLT 194.0 10/16/2017   Lab Results  Component Value Date   INR 3.0 02/04/2018   INR 3.2 (A) 01/07/2018   INR 3.4 (A) 12/10/2017     Chemistry      Component Value Date/Time   NA 137 03/04/2015 0859   K 4.0 03/04/2015 0859   CL 102 03/04/2015 0859   CO2 27 03/04/2015 0859   BUN 14 03/04/2015 0859   CREATININE 0.91 03/04/2015 0859      Component Value Date/Time   CALCIUM 8.8 03/04/2015 0859   ALKPHOS 60 11/11/2014 0923   AST 27 11/11/2014 0923   ALT 32 11/11/2014 0923   BILITOT 0.7 11/11/2014 0923     Lab Results  Component Value Date   PSA 0.73 11/11/2014   PSA 0.85 10/29/2013   PSA 0.82 06/21/2012   CC UA today: 2+ blood, otherwise normal.  IMPRESSION AND PLAN:  Acute diverticulitis most likely, although he does have some soft symptoms/signs of acute prostatitis. His LLQ abd pain is his most significant exam finding, though. Plan: bactrim DS 1 bid x 14d, flagyl 500 mg tid x 14d, vicodin 5/325, 1-2 q6h prn, #20. Urine sent for c/s. He is on coumadin--the antibiotics can cause increased anticoag effects of coumadin.  I recommended he get his PT/INR checked early next week.  Pt expressed understanding and agreement with plan.  An After Visit Summary was printed and given to the patient.  FOLLOW UP: Return if symptoms worsen or fail to improve in 3 days.  Signed:  Santiago Bumpers, MD           03/20/2018

## 2018-03-21 LAB — URINE CULTURE
MICRO NUMBER:: 91214702
Result:: NO GROWTH
SPECIMEN QUALITY: ADEQUATE

## 2018-03-27 ENCOUNTER — Telehealth: Payer: Self-pay | Admitting: Family Medicine

## 2018-03-27 NOTE — Telephone Encounter (Signed)
Pt given lab results per notes of Dr. Milinda Cave on 03/21/18. Pt verbalized understanding and says he feels a lot better. Unable to document in result notes due to result note not routed to [facility].

## 2018-04-02 ENCOUNTER — Other Ambulatory Visit: Payer: Self-pay

## 2018-04-02 ENCOUNTER — Ambulatory Visit (INDEPENDENT_AMBULATORY_CARE_PROVIDER_SITE_OTHER): Payer: Medicare Other | Admitting: Family Medicine

## 2018-04-02 ENCOUNTER — Encounter: Payer: Self-pay | Admitting: Family Medicine

## 2018-04-02 VITALS — BP 134/80 | HR 70 | Temp 98.3°F | Ht 72.0 in | Wt 247.5 lb

## 2018-04-02 DIAGNOSIS — M1A9XX0 Chronic gout, unspecified, without tophus (tophi): Secondary | ICD-10-CM

## 2018-04-02 MED ORDER — PREDNISONE 20 MG PO TABS: ORAL_TABLET | ORAL | 0 refills | Status: DC

## 2018-04-02 NOTE — Progress Notes (Signed)
  Subjective:     Patient ID: Samuel [name], male   DOB: Sep 12, 1950, 67 y.o.   MRN: [id number]  HPI Patient has had multiple recent flareups of gout involving MTP joint and left great toe.  Current acute involvement of left great toe.  We recently started allopurinol currently at 200 mg daily.  He plans by this Thursday to increase to 300 mg.  Our plan had been to take concomitant colchicine 0.6 mg daily to help reduce risk of acute flare but he cannot take this because of cost.  He cannot take nonsteroidals because of chronic anticoagulation therapy.  He has try to make some dietary modifications.  Does eat some high purine foods such as chicken livers.  He has scaled back alcohol use.  Past Medical History:  Diagnosis Date  . Aortic aneurysm (HCC)   . 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 Dyslipidemia   . Hemorrhoids   . HEMORRHOIDS-INTERNAL 12/20/2009  . HYPERLIPIDEMIA-MIXED 04/01/2009  . HYPOTHYROIDISM 08/26/2008  . INSOMNIA, TRANSIENT 04/27/2009  . Kidney stones   . KNEE PAIN 11/02/2008  . LATERAL EPICONDYLITIS 06/02/2009  . Migraines   . ONYCHOMYCOSIS 11/02/2008  . PERSONAL HX COLONIC POLYPS 12/20/2009  . Sudden visual loss 12/25/2008  . TRANSIENT ISCHEMIC ATTACK 01/25/2009   Past Surgical History:  Procedure Laterality Date  . CYSTECTOMY     from abdominal wall  . 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 TONSILLECTOMY      reports that he has quit smoking. He has never used smokeless tobacco. He reports that he drinks about 14.0 standard drinks of alcohol per week. He reports that he does not use drugs. family history includes Diabetes in his mother; Heart disease in his father. Allergies  Allergen Reactions  . Shellfish Allergy Anaphylaxis  . Penicillins     REACTION: childhood, reaction unknown     Review of Systems  Constitutional: Negative for chills and fever.       Objective:   Physical Exam  Constitutional: He appears well-developed and well-nourished.   Cardiovascular: Normal rate.  Pulmonary/Chest: Effort normal and breath sounds normal.  Musculoskeletal:  Left great toe reveals some mild erythema mild warmth.  He has tenderness around the MTP joint and just distal to this region.  There are no visible tophi.  No breaks in the skin.       Assessment:     Acute gout involving left great toe.  Probably exacerbated by recent initiation of allopurinol therapy.  He cannot take the Colcrys as above because of cost issues.  Not a candidate for nonsteroidals because of chronic Coumadin    Plan:     -Prednisone taper over the next 6 days -Continue with titration of allopurinol.  Future lab order for uric acid level in about 3 weeks.  Our goal is to get his uric acid below 6  Samuel Covey MD [facility]

## 2018-04-02 NOTE — Patient Instructions (Signed)
Get back on the prednisone  Get uric acid level in about 3 weeks      Low-Purine Diet Purines are compounds that affect the level of uric acid in your body. A low-purine diet is a diet that is low in purines. Eating a low-purine diet can prevent the level of uric acid in your body from getting too high and causing gout or kidney stones or both. What do I need to know about this diet?  Choose low-purine foods. Examples of low-purine foods are listed in the next section.  Drink plenty of fluids, especially water. Fluids can help remove uric acid from your body. Try to drink 8-16 cups (1.9-3.8 L) a day.  Limit foods high in fat, especially saturated fat, as fat makes it harder for the body to get rid of uric acid. Foods high in saturated fat include pizza, cheese, ice cream, whole milk, fried foods, and gravies. Choose foods that are lower in fat and lean sources of protein. Use olive oil when cooking as it contains healthy fats that are not high in saturated fat.  Limit alcohol. Alcohol interferes with the elimination of uric acid from your body. If you are having a gout attack, avoid all alcohol.  Keep in mind that different people's bodies react differently to different foods. You will probably learn over time which foods do or do not affect you. If you discover that a food tends to cause your gout to flare up, avoid eating that food. You can more freely enjoy foods that do not cause problems. If you have any questions about a food item, talk to your dietitian or health care provider. Which foods are low, moderate, and high in purines? The following is a list of foods that are low, moderate, and high in purines. You can eat any amount of the foods that are low in purines. You may be able to have small amounts of foods that are moderate in purines. Ask your health care provider how much of a food moderate in purines you can have. Avoid foods high in purines. Grains  Foods low in purines:  Enriched white bread, pasta, rice, cake, cornbread, popcorn.  Foods moderate in purines: Whole-grain breads and cereals, wheat germ, bran, oatmeal. Uncooked oatmeal. Dry wheat bran or wheat germ.  Foods high in purines: Pancakes, [place] toast, biscuits, muffins. Vegetables  Foods low in purines: All vegetables, except those that are moderate in purines.  Foods moderate in purines: Asparagus, cauliflower, spinach, mushrooms, green peas. Fruits  All fruits are low in purines. Meats and other Protein Foods  Foods low in purines: Eggs, nuts, peanut butter.  Foods moderate in purines: 80-90% lean beef, lamb, veal, pork, poultry, fish, eggs, peanut butter, nuts. Crab, lobster, oysters, and shrimp. Cooked dried beans, peas, and lentils.  Foods high in purines: Anchovies, sardines, herring, mussels, tuna, codfish, scallops, trout, and haddock. Tomasa Blase. Organ meats (such as liver or kidney). Tripe. Game meat. Goose. Sweetbreads. Dairy  All dairy foods are low in purines. Low-fat and fat-free dairy products are best because they are low in saturated fat. Beverages  Drinks low in purines: Water, carbonated beverages, tea, coffee, cocoa.  Drinks moderate in purines: Soft drinks and other drinks sweetened with high-fructose corn syrup. Juices. To find whether a food or drink is sweetened with high-fructose corn syrup, look at the ingredients list.  Drinks high in purines: Alcoholic beverages (such as beer). Condiments  Foods low in purines: Salt, herbs, olives, pickles, relishes, vinegar.  Foods  moderate in purines: Butter, margarine, oils, mayonnaise. Fats and Oils  Foods low in purines: All types, except gravies and sauces made with meat.  Foods high in purines: Gravies and sauces made with meat. Other Foods  Foods low in purines: Sugars, sweets, gelatin. Cake. Soups made without meat.  Foods moderate in purines: Meat-based or fish-based soups, broths, or bouillons. Foods and drinks  sweetened with high-fructose corn syrup.  Foods high in purines: High-fat desserts (such as ice cream, cookies, cakes, pies, doughnuts, and chocolate). Contact your dietitian for more information on foods that are not listed here. This information is not intended to replace advice given to you by your health care provider. Make sure you discuss any questions you have with your health care provider. Document Released: 09/23/2010 Document Revised: 11/04/2015 Document Reviewed: 05/05/2013 Elsevier Interactive Patient Education  2017 [employer].

## 2018-04-03 ENCOUNTER — Other Ambulatory Visit: Payer: Self-pay | Admitting: Family Medicine

## 2018-04-04 ENCOUNTER — Telehealth: Payer: Self-pay | Admitting: Family Medicine

## 2018-04-04 ENCOUNTER — Other Ambulatory Visit: Payer: Self-pay | Admitting: Family Medicine

## 2018-04-04 NOTE — Telephone Encounter (Signed)
Copied from CRM [id number]. Topic: Quick Communication - See Telephone Encounter >> Apr 04, 2018 12:34 PM Lorrine Kin, NT wrote: CRM for notification. See Telephone encounter for: 04/04/18. Patient calling and states that he is on his 3rd day of predniSONE (DELTASONE) 20 MG tablet. Has reached max dosage on allopurinol (ZYLOPRIM) 100 MG tablet and is taking colchicine 0.6 MG tablet and toe is only slightly better. What would he suggest? CB#: [phone]

## 2018-04-05 NOTE — Telephone Encounter (Signed)
If toe not better by Monday recommend office follow up.  If this is all gout, should be improving with the prednisone.

## 2018-04-05 NOTE — Telephone Encounter (Signed)
Called patient and gave him message from Dr. Caryl Never and he stated that his toe is still bothering him and that Prednisone has always worked in the past and it is not working this time.  Patient verbalized an understanding.

## 2018-04-05 NOTE — Telephone Encounter (Signed)
Last OV 04/02/18, No future OV  Please advise.

## 2018-04-09 ENCOUNTER — Encounter: Payer: Self-pay | Admitting: Family Medicine

## 2018-04-09 ENCOUNTER — Ambulatory Visit (INDEPENDENT_AMBULATORY_CARE_PROVIDER_SITE_OTHER): Payer: Medicare Other | Admitting: Family Medicine

## 2018-04-09 VITALS — BP 108/80 | HR 65 | Temp 97.9°F | Wt 241.0 lb

## 2018-04-09 DIAGNOSIS — M109 Gout, unspecified: Secondary | ICD-10-CM | POA: Diagnosis not present

## 2018-04-09 DIAGNOSIS — M1A9XX0 Chronic gout, unspecified, without tophus (tophi): Secondary | ICD-10-CM | POA: Diagnosis not present

## 2018-04-09 NOTE — Progress Notes (Signed)
Subjective:    Patient ID: Samuel [name], male    DOB: 1950/08/21, 67 y.o.   MRN: [id number]  No chief complaint on file.   HPI Patient was seen today for ongoing concern.  Pt endorses continued left great toe pain 2/2 gout x >1 month.  Pt seen in office on 9/13  and 10/22 for similar issues.  At last OFV started on 6 day prednisone taper, allopurinol 300 mg and colchicine.  Pt endorses little relief in symptoms.  Pt states he is eating better and drinking lots of water.   Pt states he is not drinking alcohol or eating organ meats.     Past Medical History:  Diagnosis Date  . Aortic aneurysm (HCC)   . 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 Dyslipidemia   . Hemorrhoids   . HEMORRHOIDS-INTERNAL 12/20/2009  . HYPERLIPIDEMIA-MIXED 04/01/2009  . HYPOTHYROIDISM 08/26/2008  . INSOMNIA, TRANSIENT 04/27/2009  . Kidney stones   . KNEE PAIN 11/02/2008  . LATERAL EPICONDYLITIS 06/02/2009  . Migraines   . ONYCHOMYCOSIS 11/02/2008  . PERSONAL HX COLONIC POLYPS 12/20/2009  . Sudden visual loss 12/25/2008  . TRANSIENT ISCHEMIC ATTACK 01/25/2009    Allergies  Allergen Reactions  . Shellfish Allergy Anaphylaxis  . Penicillins     REACTION: childhood, reaction unknown    ROS General: Denies fever, chills, night sweats, changes in weight, changes in appetite HEENT: Denies headaches, ear pain, changes in vision, rhinorrhea, sore throat CV: Denies CP, palpitations, SOB, orthopnea Pulm: Denies SOB, cough, wheezing GI: Denies abdominal pain, nausea, vomiting, diarrhea, constipation GU: Denies dysuria, hematuria, frequency, vaginal discharge Msk: Denies muscle cramps, joint pains   +L great toe pain Neuro: Denies weakness, numbness, tingling Skin: Denies rashes, bruising Psych: Denies depression, anxiety, hallucinations      Objective:    Blood pressure 108/80, pulse 65, temperature 97.9 F (36.6 C), temperature source Oral, weight 241 lb (109.3 kg), SpO2 96 %.  Gen. Pleasant, well-nourished, in no  distress, normal affect   Lungs: no accessory muscle use Cardiovascular: RRR, no peripheral edema Neuro:  A&Ox3, CN II-XII intact, ambulating with a limp Skin:  Warm.  L great toe with increased erythema, warmth, and TTP at MTP joint.   Wt Readings from Last 3 Encounters:  04/09/18 241 lb (109.3 kg)  04/02/18 247 lb 8 oz (112.3 kg)  03/20/18 247 lb 6 oz (112.2 kg)    Lab Results  Component Value Date   WBC 10.5 10/16/2017   HGB 15.7 10/16/2017   HCT 44.6 10/16/2017   PLT 194.0 10/16/2017   GLUCOSE 111 (H) 03/04/2015   CHOL 188 04/27/2017   TRIG 82 04/27/2017   HDL 46 04/27/2017   LDLDIRECT 137.9 06/21/2012   LDLCALC 126 (H) 04/27/2017   ALT 32 11/11/2014   AST 27 11/11/2014   NA 137 03/04/2015   K 4.0 03/04/2015   CL 102 03/04/2015   CREATININE 0.91 03/04/2015   BUN 14 03/04/2015   CO2 27 03/04/2015   TSH 1.32 07/10/2017   PSA 0.73 11/11/2014   INR 3.0 02/04/2018   HGBA1C  02/23/2009    5.8 (NOTE) The ADA recommends the following therapeutic goal for glycemic control related to Hgb A1c measurement: Goal of therapy: <6.5 Hgb A1c  Reference: American Diabetes Association: Clinical Practice Recommendations 2010, Diabetes Care, 2010, 33: (Suppl  1).    Assessment/Plan:  Chronic gout without tophus, unspecified cause, unspecified site  -Rx for another prednisone taper not given as pt just finished prednisone 2 days  ago -Unable to use NSAIDs 2/2 warfarin use -discussed eating low purine foods and staying hydrated -discussed using ice and soaking foot in epsom salt -will obtain uric acid level today -consider Uloric, however there is a black box warning for increased CV risk. - Plan: Uric Acid  F/u with pcp prn for continued symptoms.  Abbe Amsterdam, MD

## 2018-04-09 NOTE — Patient Instructions (Signed)
Gout Gout is painful swelling that can occur in some of your joints. Gout is a type of arthritis. This condition is caused by having too much uric acid in your body. Uric acid is a chemical that forms when your body breaks down substances called purines. Purines are important for building body proteins. When your body has too much uric acid, sharp crystals can form and build up inside your joints. This causes pain and swelling. Gout attacks can happen quickly and be very painful (acute gout). Over time, the attacks can affect more joints and become more frequent (chronic gout). Gout can also cause uric acid to build up under your skin and inside your kidneys. What are the causes? This condition is caused by too much uric acid in your blood. This can occur because:  Your kidneys do not remove enough uric acid from your blood. This is the most common cause.  Your body makes too much uric acid. This can occur with some cancers and cancer treatments. It can also occur if your body is breaking down too many red blood cells (hemolytic anemia).  You eat too many foods that are high in purines. These foods include organ meats and some seafood. Alcohol, especially beer, is also high in purines.  A gout attack may be triggered by trauma or stress. What increases the risk? This condition is more likely to develop in people who:  Have a family history of gout.  Are male and middle-aged.  Are male and have gone through menopause.  Are obese.  Frequently drink alcohol, especially beer.  Are dehydrated.  Lose weight too quickly.  Have an organ transplant.  Have lead poisoning.  Take certain medicines, including aspirin, cyclosporine, diuretics, levodopa, and niacin.  Have kidney disease or psoriasis.  What are the signs or symptoms? An attack of acute gout happens quickly. It usually occurs in just one joint. The most common place is the big toe. Attacks often start at night. Other joints  that may be affected include joints of the feet, ankle, knee, fingers, wrist, or elbow. Symptoms may include:  Severe pain.  Warmth.  Swelling.  Stiffness.  Tenderness. The affected joint may be very painful to touch.  Shiny, red, or purple skin.  Chills and fever.  Chronic gout may cause symptoms more frequently. More joints may be involved. You may also have white or yellow lumps (tophi) on your hands or feet or in other areas near your joints. How is this diagnosed? This condition is diagnosed based on your symptoms, medical history, and physical exam. You may have tests, such as:  Blood tests to measure uric acid levels.  Removal of joint fluid with a needle (aspiration) to look for uric acid crystals.  X-rays to look for joint damage.  How is this treated? Treatment for this condition has two phases: treating an acute attack and preventing future attacks. Acute gout treatment may include medicines to reduce pain and swelling, including:  NSAIDs.  Steroids. These are strong anti-inflammatory medicines that can be taken by mouth (orally) or injected into a joint.  Colchicine. This medicine relieves pain and swelling when it is taken soon after an attack. It can be given orally or through an IV tube.  Preventive treatment may include:  Daily use of smaller doses of NSAIDs or colchicine.  Use of a medicine that reduces uric acid levels in your blood.  Changes to your diet. You may need to see a specialist about healthy eating (dietitian).    Follow these instructions at home: During a Gout Attack  If directed, apply ice to the affected area: ? Put ice in a plastic bag. ? Place a towel between your skin and the bag. ? Leave the ice on for 20 minutes, 2-3 times a day.  Rest the joint as much as possible. If the affected joint is in your leg, you may be given crutches to use.  Raise (elevate) the affected joint above the level of your heart as often as  possible.  Drink enough fluids to keep your urine clear or pale yellow.  Take over-the-counter and prescription medicines only as told by your health care provider.  Do not drive or operate heavy machinery while taking prescription pain medicine.  Follow instructions from your health care provider about eating or drinking restrictions.  Return to your normal activities as told by your health care provider. Ask your health care provider what activities are safe for you. Avoiding Future Gout Attacks  Follow a low-purine diet as told by your dietitian or health care provider. Avoid foods and drinks that are high in purines, including liver, kidney, anchovies, asparagus, herring, mushrooms, mussels, and beer.  Limit alcohol intake to no more than 1 drink a day for nonpregnant women and 2 drinks a day for men. One drink equals 12 oz of beer, 5 oz of wine, or 1 oz of hard liquor.  Maintain a healthy weight or lose weight if you are overweight. If you want to lose weight, talk with your health care provider. It is important that you do not lose weight too quickly.  Start or maintain an exercise program as told by your health care provider.  Drink enough fluids to keep your urine clear or pale yellow.  Take over-the-counter and prescription medicines only as told by your health care provider.  Keep all follow-up visits as told by your health care provider. This is important. Contact a health care provider if:  You have another gout attack.  You continue to have symptoms of a gout attack after10 days of treatment.  You have side effects from your medicines.  You have chills or a fever.  You have burning pain when you urinate.  You have pain in your lower back or belly. Get help right away if:  You have severe or uncontrolled pain.  You cannot urinate. This information is not intended to replace advice given to you by your health care provider. Make sure you discuss any questions  you have with your health care provider. Document Released: 05/26/2000 Document Revised: 11/04/2015 Document Reviewed: 03/11/2015 Elsevier Interactive Patient Education  2018 [employer] Low-Purine Diet Purines are compounds that affect the level of uric acid in your body. A low-purine diet is a diet that is low in purines. Eating a low-purine diet can prevent the level of uric acid in your body from getting too high and causing gout or kidney stones or both. What do I need to know about this diet?  Choose low-purine foods. Examples of low-purine foods are listed in the next section.  Drink plenty of fluids, especially water. Fluids can help remove uric acid from your body. Try to drink 8-16 cups (1.9-3.8 L) a day.  Limit foods high in fat, especially saturated fat, as fat makes it harder for the body to get rid of uric acid. Foods high in saturated fat include pizza, cheese, ice cream, whole milk, fried foods, and gravies. Choose foods that are lower in fat and lean sources   of protein. Use olive oil when cooking as it contains healthy fats that are not high in saturated fat.  Limit alcohol. Alcohol interferes with the elimination of uric acid from your body. If you are having a gout attack, avoid all alcohol.  Keep in mind that different people's bodies react differently to different foods. You will probably learn over time which foods do or do not affect you. If you discover that a food tends to cause your gout to flare up, avoid eating that food. You can more freely enjoy foods that do not cause problems. If you have any questions about a food item, talk to your dietitian or health care provider. Which foods are low, moderate, and high in purines? The following is a list of foods that are low, moderate, and high in purines. You can eat any amount of the foods that are low in purines. You may be able to have small amounts of foods that are moderate in purines. Ask your health care provider how  much of a food moderate in purines you can have. Avoid foods high in purines. Grains  Foods low in purines: Enriched white bread, pasta, rice, cake, cornbread, popcorn.  Foods moderate in purines: Whole-grain breads and cereals, wheat germ, bran, oatmeal. Uncooked oatmeal. Dry wheat bran or wheat germ.  Foods high in purines: Pancakes, French toast, biscuits, muffins. Vegetables  Foods low in purines: All vegetables, except those that are moderate in purines.  Foods moderate in purines: Asparagus, cauliflower, spinach, mushrooms, green peas. Fruits  All fruits are low in purines. Meats and other Protein Foods  Foods low in purines: Eggs, nuts, peanut butter.  Foods moderate in purines: 80-90% lean beef, lamb, veal, pork, poultry, fish, eggs, peanut butter, nuts. Crab, lobster, oysters, and shrimp. Cooked dried beans, peas, and lentils.  Foods high in purines: Anchovies, sardines, herring, mussels, tuna, codfish, scallops, trout, and haddock. Bacon. Organ meats (such as liver or kidney). Tripe. Game meat. Goose. Sweetbreads. Dairy  All dairy foods are low in purines. Low-fat and fat-free dairy products are best because they are low in saturated fat. Beverages  Drinks low in purines: Water, carbonated beverages, tea, coffee, cocoa.  Drinks moderate in purines: Soft drinks and other drinks sweetened with high-fructose corn syrup. Juices. To find whether a food or drink is sweetened with high-fructose corn syrup, look at the ingredients list.  Drinks high in purines: Alcoholic beverages (such as beer). Condiments  Foods low in purines: Salt, herbs, olives, pickles, relishes, vinegar.  Foods moderate in purines: Butter, margarine, oils, mayonnaise. Fats and Oils  Foods low in purines: All types, except gravies and sauces made with meat.  Foods high in purines: Gravies and sauces made with meat. Other Foods  Foods low in purines: Sugars, sweets, gelatin. Cake. Soups made  without meat.  Foods moderate in purines: Meat-based or fish-based soups, broths, or bouillons. Foods and drinks sweetened with high-fructose corn syrup.  Foods high in purines: High-fat desserts (such as ice cream, cookies, cakes, pies, doughnuts, and chocolate). Contact your dietitian for more information on foods that are not listed here. This information is not intended to replace advice given to you by your health care provider. Make sure you discuss any questions you have with your health care provider. Document Released: 09/23/2010 Document Revised: 11/04/2015 Document Reviewed: 05/05/2013 Elsevier Interactive Patient Education  2017 [employer]  

## 2018-04-10 LAB — URIC ACID: Uric Acid, Serum: 5.4 mg/dL (ref 4.0–7.8)

## 2018-04-29 ENCOUNTER — Ambulatory Visit (INDEPENDENT_AMBULATORY_CARE_PROVIDER_SITE_OTHER): Payer: Medicare Other | Admitting: Family Medicine

## 2018-04-29 ENCOUNTER — Encounter: Payer: Self-pay | Admitting: Family Medicine

## 2018-04-29 VITALS — BP 126/72 | HR 69 | Temp 98.4°F | Resp 16 | Ht 72.0 in | Wt 245.5 lb

## 2018-04-29 DIAGNOSIS — M25552 Pain in left hip: Secondary | ICD-10-CM | POA: Diagnosis not present

## 2018-04-29 DIAGNOSIS — M545 Low back pain, unspecified: Secondary | ICD-10-CM

## 2018-04-29 MED ORDER — HYDROCODONE-ACETAMINOPHEN 5-325 MG PO TABS: ORAL_TABLET | ORAL | 0 refills | Status: DC

## 2018-04-29 NOTE — Patient Instructions (Addendum)
Take two extra strength tylenol in morning and early afternoon, and ONE extra strength tylenol in the evening. At bedtime, you can take 1-2 hydrocodone/acetaminophen tabs for pain.  Apply ice to the area of pain for 20 minutes twice per day for the next 2d.  Then start applying a heating pad to the area of pain for 20 min twice per day as needed.  Low Back Sprain Rehab Ask your health care provider which exercises are safe for you. Do exercises exactly as told by your health care provider and adjust them as directed. It is normal to feel mild stretching, pulling, tightness, or discomfort as you do these exercises, but you should stop right away if you feel sudden pain or your pain gets worse. Do not begin these exercises until told by your health care provider. Stretching and range of motion exercises These exercises warm up your muscles and joints and improve the movement and flexibility of your back. These exercises also help to relieve pain, numbness, and tingling. Exercise A: Lumbar rotation  1. Lie on your back on a firm surface and bend your knees. 2. Straighten your arms out to your sides so each arm forms an "L" shape with a side of your body (a 90 degree angle). 3. Slowly move both of your knees to one side of your body until you feel a stretch in your lower back. Try not to let your shoulders move off of the floor. 4. Hold for __________ seconds. 5. Tense your abdominal muscles and slowly move your knees back to the starting position. 6. Repeat this exercise on the other side of your body. Repeat __________ times. Complete this exercise __________ times a day. Exercise B: Prone extension on elbows  1. Lie on your abdomen on a firm surface. 2. Prop yourself up on your elbows. 3. Use your arms to help lift your chest up until you feel a gentle stretch in your abdomen and your lower back. ? This will place some of your body weight on your elbows. If this is uncomfortable, try stacking  pillows under your chest. ? Your hips should stay down, against the surface that you are lying on. Keep your hip and back muscles relaxed. 4. Hold for __________ seconds. 5. Slowly relax your upper body and return to the starting position. Repeat __________ times. Complete this exercise __________ times a day. Strengthening exercises These exercises build strength and endurance in your back. Endurance is the ability to use your muscles for a long time, even after they get tired. Exercise C: Pelvic tilt 1. Lie on your back on a firm surface. Bend your knees and keep your feet flat. 2. Tense your abdominal muscles. Tip your pelvis up toward the ceiling and flatten your lower back into the floor. ? To help with this exercise, you may place a small towel under your lower back and try to push your back into the towel. 3. Hold for __________ seconds. 4. Let your muscles relax completely before you repeat this exercise. Repeat __________ times. Complete this exercise __________ times a day. Exercise D: Alternating arm and leg raises  1. Get on your hands and knees on a firm surface. If you are on a hard floor, you may want to use padding to cushion your knees, such as an exercise mat. 2. Line up your arms and legs. Your hands should be below your shoulders, and your knees should be below your hips. 3. Lift your left leg behind you. At the  same time, raise your right arm and straighten it in front of you. ? Do not lift your leg higher than your hip. ? Do not lift your arm higher than your shoulder. ? Keep your abdominal and back muscles tight. ? Keep your hips facing the ground. ? Do not arch your back. ? Keep your balance carefully, and do not hold your breath. 4. Hold for __________ seconds. 5. Slowly return to the starting position and repeat with your right leg and your left arm. Repeat __________ times. Complete this exercise __________ times a day. Exercise E: Abdominal set with straight  leg raise  1. Lie on your back on a firm surface. 2. Bend one of your knees and keep your other leg straight. 3. Tense your abdominal muscles and lift your straight leg up, 4-6 inches (10-15 cm) off the ground. 4. Keep your abdominal muscles tight and hold for __________ seconds. ? Do not hold your breath. ? Do not arch your back. Keep it flat against the ground. 5. Keep your abdominal muscles tense as you slowly lower your leg back to the starting position. 6. Repeat with your other leg. Repeat __________ times. Complete this exercise __________ times a day. Posture and body mechanics  Body mechanics refers to the movements and positions of your body while you do your daily activities. Posture is part of body mechanics. Good posture and healthy body mechanics can help to relieve stress in your body's tissues and joints. Good posture means that your spine is in its natural S-curve position (your spine is neutral), your shoulders are pulled back slightly, and your head is not tipped forward. The following are general guidelines for applying improved posture and body mechanics to your everyday activities. Standing   When standing, keep your spine neutral and your feet about hip-width apart. Keep a slight bend in your knees. Your ears, shoulders, and hips should line up.  When you do a task in which you stand in one place for a long time, place one foot up on a stable object that is 2-4 inches (5-10 cm) high, such as a footstool. This helps keep your spine neutral. Sitting   When sitting, keep your spine neutral and keep your feet flat on the floor. Use a footrest, if necessary, and keep your thighs parallel to the floor. Avoid rounding your shoulders, and avoid tilting your head forward.  When working at a desk or a computer, keep your desk at a height where your hands are slightly lower than your elbows. Slide your chair under your desk so you are close enough to maintain good  posture.  When working at a computer, place your monitor at a height where you are looking straight ahead and you do not have to tilt your head forward or downward to look at the screen. Resting   When lying down and resting, avoid positions that are most painful for you.  If you have pain with activities such as sitting, bending, stooping, or squatting (flexion-based activities), lie in a position in which your body does not bend very much. For example, avoid curling up on your side with your arms and knees near your chest (fetal position).  If you have pain with activities such as standing for a long time or reaching with your arms (extension-based activities), lie with your spine in a neutral position and bend your knees slightly. Try the following positions:  Lying on your side with a pillow between your knees.  Lying on  your back with a pillow under your knees. Lifting   When lifting objects, keep your feet at least shoulder-width apart and tighten your abdominal muscles.  Bend your knees and hips and keep your spine neutral. It is important to lift using the strength of your legs, not your back. Do not lock your knees straight out.  Always ask for help to lift heavy or awkward objects. This information is not intended to replace advice given to you by your health care provider. Make sure you discuss any questions you have with your health care provider. Document Released: 05/29/2005 Document Revised: 02/03/2016 Document Reviewed: 03/10/2015 Elsevier Interactive Patient Education  [employer].

## 2018-04-29 NOTE — Progress Notes (Signed)
OFFICE VISIT  04/29/2018   CC:  Chief Complaint  Patient presents with  . Hip Pain    left, started after playing 9 rounds of golf   HPI:    Patient is a 67 y.o.  Samuel [name] who presents for left hip pain.  Fatigued yesterday, mild lethargy.  Was sitting around home. Decided to play golf.  He played 9 holes of golf yesterday, and after 7 holes he noticed a twinge of pain in L hip, gradually got worse and went up L LB.  He is having some mild left groin pain now.  Hip pain radiates down L anterolateral thigh some.  MOST intense and concerning area of pain at this time is L upper hip/L LB region.  Certain positions cause the L hip pain to get worse.   No blood noted in urine.  He hydrates very well. No trauma to hip or back.  No paresthesias, no saddle anesthesia, no LE weakness.   No hx of hip or LB pain.  Hx of diverticulitis, got better in 3d of abx---I saw him for this 03/20/18.   Past Medical History:  Diagnosis Date  . Aortic aneurysm ([facility]CC)   . 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 Dyslipidemia   . Gout   . Hemorrhoids   . HEMORRHOIDS-INTERNAL 12/20/2009  . History of diverticulitis 03/2018  . HYPERLIPIDEMIA-MIXED 04/01/2009  . HYPOTHYROIDISM 08/26/2008  . INSOMNIA, TRANSIENT 04/27/2009  . Kidney stones   . KNEE PAIN 11/02/2008  . LATERAL EPICONDYLITIS 06/02/2009  . Migraines   . ONYCHOMYCOSIS 11/02/2008  . PERSONAL HX COLONIC POLYPS 12/20/2009  . Sudden visual loss 12/25/2008  . TRANSIENT ISCHEMIC ATTACK 01/25/2009    Past Surgical History:  Procedure Laterality Date  . CYSTECTOMY     from abdominal wall  . 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 TONSILLECTOMY      Outpatient Medications Prior to Visit  Medication Sig Dispense Refill  . allopurinol (ZYLOPRIM) 100 MG tablet TAKE 1 TABLET BY MOUTH EVERY DAY FOR 2 WEEKS, TAKE 2 tablets daily FOR 2 WEEKS, THEN TAKE 3 TABLETS daily 90 tablet 6  . colchicine 0.6 MG tablet Take 1 tablet (0.6 mg total) by mouth daily.  (Patient taking differently: Take 0.6 mg by mouth daily. PT REQUESTS FOR THE RX TO ALWAYS BE SENT TO [facility] [place]) 90 tablet 1  . levothyroxine (SYNTHROID, LEVOTHROID) 150 MCG tablet Take 1 tablet (150 mcg total) by mouth daily. 90 tablet 3  . warfarin (COUMADIN) 5 MG tablet Take 1 1/2 tablets daily except 2 tablets on Sun Tues and Thurs or AS DIRECTED BY ANTICOAGULATION CLINIC 150 tablet 0  . warfarin (COUMADIN) 5 MG tablet Take 2 tablets by mouth daily or AS DIRECTED BY ANTICOAGULATION CLINIC. (Patient not taking: Reported on 04/29/2018) 180 tablet 0  . HYDROcodone-acetaminophen (NORCO/VICODIN) 5-325 MG tablet Take 1-2 tablets by mouth every 6 (six) hours as needed for moderate pain. (Patient not taking: Reported on 04/29/2018) 20 tablet 0  . predniSONE (DELTASONE) 20 MG tablet TAKE 3 TABLETS BY MOUTH EVERY DAY FOR 2 DAYS THEN 2 TABLETS EVERY DAY FOR 4 DAYS (Patient not taking: Reported on 04/29/2018) 14 tablet 0   No facility-administered medications prior to visit.     Allergies  Allergen Reactions  . Shellfish Allergy Anaphylaxis  . Penicillins     REACTION: childhood, reaction unknown    ROS As per HPI  PE: Blood pressure 126/72, pulse 69, temperature 98.4  F (36.9 C), temperature source Oral, resp. rate 16, height 6' (1.829 m), weight 245 lb 8 oz (111.4 kg), SpO2 95 %. Gen: Alert, well appearing.  Patient is oriented to person, place, time, and situation. AFFECT: pleasant but a bit nervous and flustered, lucid thought and speech. ZOX:WRUEENT:Eyes: no injection, icteris, swelling, or exudate.  EOMI, PERRLA. Mouth: lips without lesion/swelling.  Oral mucosa pink and moist. Oropharynx without erythema, exudate, or swelling.  CV: RRR, mechanical S2, no m/r/g.  He does have occasional ectopic beat. LUNGS: CTA bilat, nonlabored resps, good aeration in all lung fields. ABD: soft, mild diffuse lower abd TTP, LLQ>middle>RLQ.  No guarding or rebound.  BS normal. NO HSM or mass or bruit.  No  groin tenderness or thigh tenderness.  Greater troch non tender. EXT: no clubbing or cyanosis.  no edema.  BACK: ROM intact but flexion and lateral bending to the left makes L LB hurt. No CVA tenderness.  He has definite L lumbosacral region tenderness diffusely.   Supine SLR neg bilat.  Hip ROM intact, with mild LLB pain elicited with ER of L hip.   LE strength 5/5 prox/dist bilat.   No rash in the area of pain.   LABS:  Lab Results  Component Value Date   LABURIC 5.4 04/09/2018     Chemistry      Component Value Date/Time   NA 137 03/04/2015 0859   K 4.0 03/04/2015 0859   CL 102 03/04/2015 0859   CO2 27 03/04/2015 0859   BUN 14 03/04/2015 0859   CREATININE 0.91 03/04/2015 0859      Component Value Date/Time   CALCIUM 8.8 03/04/2015 0859   ALKPHOS 60 11/11/2014 0923   AST 27 11/11/2014 0923   ALT 32 11/11/2014 0923   BILITOT 0.7 11/11/2014 0923     Lab Results  Component Value Date   INR 3.0 02/04/2018   INR 3.2 (A) 01/07/2018   INR 3.4 (A) 12/10/2017     IMPRESSION AND PLAN:  Acute L L/S strain and L hip strain. Pt a bit worried about this being sx's mimicking an episode of kidney stones in remote past AND his case of diverticulitis 6 wks ago.  His abdomen is a bit tender in LLQ but he says this is essentially his baseline from his diverticulosis. He essentially has all musculoskeletal symptoms and exam findings. Reassured pt.  No labs or imaging needed today. Low back stretching discussed, handout given.  Tylenol daytime recommended.  Vicodin 5/325, 1-2 qhs prn pain, #20. Therapeutic expectations and side effect profile of medication discussed today.  Patient's questions answered. No NSAIDs--pt is on coumadin.  Further instructions: Take two extra strength tylenol in morning and early afternoon, and ONE extra strength tylenol in the evening. At bedtime, you can take 1-2 hydrocodone/acetaminophen tabs for pain.  Apply ice to the area of pain for 20 minutes twice  per day for the next 2d.  Then start applying a heating pad to the area of pain for 20 min twice per day as needed.  An After Visit Summary was printed and given to the patient.  FOLLOW UP: Return if symptoms worsen or fail to improve.  Signed:  Santiago BumpersPhil , MD           04/29/2018

## 2018-04-30 ENCOUNTER — Telehealth: Payer: Self-pay

## 2018-04-30 ENCOUNTER — Ambulatory Visit: Payer: Medicare Other | Admitting: Family Medicine

## 2018-04-30 ENCOUNTER — Ambulatory Visit: Payer: Self-pay

## 2018-04-30 NOTE — Telephone Encounter (Signed)
Pt. Saw Dr. Marvel PlanMcGowan yesterday with left sided back pain and hip pain. Has taken 5 of his pain pills with little relief. Can find a position that is comfortable briefly, then he has to change position. Ice did not help. Pain is worse today. Request appointment for today.Appointment made.   Reason for Disposition . [1] MODERATE back pain (e.g., interferes with normal activities) AND [2] present > 3 days  Answer Assessment - Initial Assessment Questions 1. ONSET: "When did the pain begin?"      Started Sunday 2. LOCATION: "Where does it hurt?" (upper, mid or lower back)     Low back and left side and hip 3. SEVERITY: "How bad is the pain?"  (e.g., Scale 1-10; mild, moderate, or severe)   - MILD (1-3): doesn\'t interfere with normal activities    - MODERATE (4-7): interferes with normal activities or awakens from sleep    - SEVERE (8-10): excruciating pain, unable to do any normal activities      10  with movement 4. PATTERN: "Is the pain constant?" (e.g., yes, no; constant, intermittent)      Comes and goes 5. RADIATION: "Does the pain shoot into your legs or elsewhere?"     No 6. CAUSE:  "What do you think is causing the back pain?"      Saw Dr. Milinda CaveMcGowen 7. BACK OVERUSE:  "Any recent lifting of heavy objects, strenuous work or exercise?"     Played golf Sunday 8. MEDICATIONS: "What have you taken so far for the pain?" (e.g., nothing, acetaminophen, NSAIDS)     [place] 9. NEUROLOGIC SYMPTOMS: "Do you have any weakness, numbness, or problems with bowel/bladder control?"     No 10. OTHER SYMPTOMS: "Do you have any other symptoms?" (e.g., fever, abdominal pain, burning with urination, blood in urine)       No 11. PREGNANCY: "Is there any chance you are pregnant?" (e.g., yes, no; LMP)       n/a  Protocols used: BACK PAIN-A-AH

## 2018-04-30 NOTE — Telephone Encounter (Signed)
Noted  

## 2018-04-30 NOTE — Telephone Encounter (Signed)
FYI

## 2018-04-30 NOTE — Telephone Encounter (Signed)
Called patient and he has an appointment tomorrow at 11am. Gave patient message from Dr. Caryl NeverBurchette.

## 2018-04-30 NOTE — Telephone Encounter (Signed)
If continues to have severe pain really needs to be reassessed.  May need some imaging to clarify.

## 2018-04-30 NOTE — Telephone Encounter (Signed)
Noted. Patient is on schedule for today.

## 2018-04-30 NOTE — Telephone Encounter (Signed)
Please see message.  Please advise.  Copied from CRM [id number]. Topic: General - Other >> Apr 30, 2018  1:37 PM Crist InfanteHarrald, Kathy J wrote: Reason for CRM: pt had to cancel his appt today at 2:20 with Dr Caryl NeverBurchette.  [place] in [place], and unable to get in the car and drive here today due to being in extreme pain.  Pt has no one to bring him, and just cannot get in the car. Pt states if you could let Br Burchette know, and he does not know what else to do from now.

## 2018-05-01 ENCOUNTER — Other Ambulatory Visit: Payer: Self-pay

## 2018-05-01 ENCOUNTER — Ambulatory Visit (INDEPENDENT_AMBULATORY_CARE_PROVIDER_SITE_OTHER): Payer: Medicare Other | Admitting: Family Medicine

## 2018-05-01 ENCOUNTER — Encounter: Payer: Self-pay | Admitting: Family Medicine

## 2018-05-01 VITALS — BP 122/78 | HR 65 | Ht 72.0 in | Wt 248.6 lb

## 2018-05-01 DIAGNOSIS — M5416 Radiculopathy, lumbar region: Secondary | ICD-10-CM | POA: Diagnosis not present

## 2018-05-01 MED ORDER — PREDNISONE 10 MG PO TABS: ORAL_TABLET | ORAL | 0 refills | Status: DC

## 2018-05-01 MED ORDER — HYDROCODONE-ACETAMINOPHEN 5-325 MG PO TABS: ORAL_TABLET | ORAL | 0 refills | Status: DC

## 2018-05-01 NOTE — Progress Notes (Signed)
Subjective:     Patient ID: Samuel [name], male   DOB: 05/28/1951, 67 y.o.   MRN: [id number]  HPI Patient is seen with fairly severe pain left lower lumbar back with radiation toward the buttock.  Onset this past Sunday while playing golf around the seventh hole.  Pain progressed.  Denies specific injury.  Went to another of our clinic sites Monday and was prescribed hydrocodone currently taking 2 every 6 hours.  His pain is worse with sitting and improved with standing.  Radiates from the left lower lumbar area into the left buttock.  He denies any lower extremity numbness or weakness.  No perianal anesthesia.  No urine or stool incontinence.  He is noted that pain is improved with back extension.  No past history of chronic back difficulties.  Pain is slightly better today compared with yesterday.  No abdominal pain.  No groin pain.  Past Medical History:  Diagnosis Date  . Aortic aneurysm (HCC)   . 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 Dyslipidemia   . Gout   . Hemorrhoids   . HEMORRHOIDS-INTERNAL 12/20/2009  . History of diverticulitis 03/2018  . HYPERLIPIDEMIA-MIXED 04/01/2009  . HYPOTHYROIDISM 08/26/2008  . INSOMNIA, TRANSIENT 04/27/2009  . Kidney stones   . KNEE PAIN 11/02/2008  . LATERAL EPICONDYLITIS 06/02/2009  . Migraines   . ONYCHOMYCOSIS 11/02/2008  . PERSONAL HX COLONIC POLYPS 12/20/2009  . Sudden visual loss 12/25/2008  . TRANSIENT ISCHEMIC ATTACK 01/25/2009   Past Surgical History:  Procedure Laterality Date  . CYSTECTOMY     from abdominal wall  . 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 TONSILLECTOMY      reports that he has quit smoking. He has never used smokeless tobacco. He reports that he drinks about 14.0 standard drinks of alcohol per week. He reports that he does not use drugs. family history includes Diabetes in his mother; Heart disease in his father. Allergies  Allergen Reactions  . Shellfish Allergy Anaphylaxis  . Penicillins     REACTION:  childhood, reaction unknown     Review of Systems  Constitutional: Negative for activity change, appetite change and fever.  Respiratory: Negative for cough and shortness of breath.   Cardiovascular: Negative for chest pain and leg swelling.  Gastrointestinal: Negative for abdominal pain and vomiting.  Genitourinary: Negative for dysuria, flank pain and hematuria.  Musculoskeletal: Positive for back pain. Negative for joint swelling.  Neurological: Negative for weakness and numbness.  Hematological: Negative for adenopathy.       Objective:   Physical Exam  Constitutional: He appears well-developed and well-nourished.  Cardiovascular: Normal rate.  Pulmonary/Chest: Effort normal and breath sounds normal.  Musculoskeletal:  Straight leg raise are negative bilaterally  Excellent range of motion left hip.  No localized hip tenderness  Neurological:  Full strength with plantar flexion dorsiflexion as well as knee extension.  Trace reflex ankle bilaterally and 1+ knee bilaterally       Assessment:     Left lower lumbar back pain with acute onset few days ago with radiation into the buttock.  Suspect lumbar radiculopathy probably related to nerve impingement.  No focal weakness.  No neurologic deficits at this time    Plan:     -Reviewed some appropriate extension stretches -Walking as tolerated.  Avoid any frequent back flexion or heavy lifting -Prednisone taper starting at 60 mg daily -Continue hydrocodone as needed for severe pain but try to taper off quickly. -Touch base  by next week if not improving.  Follow-up immediately for any weakness or other neurologic concerns  Kristian Covey MD [facility] at [facility]

## 2018-05-01 NOTE — Patient Instructions (Signed)
Drink plenty of fluids  May take Colace (two daily)   Lumbosacral Radiculopathy Lumbosacral radiculopathy is a condition that involves the spinal nerves and nerve roots in the low back and bottom of the spine. The condition develops when these nerves and nerve roots move out of place or become inflamed and cause symptoms. What are the causes? This condition may be caused by:  Pressure from a disk that bulges out of place (herniated disk). A disk is a plate of cartilage that separates bones in the spine.  Disk degeneration.  A narrowing of the bones of the lower back (spinal stenosis).  A tumor.  An infection.  An injury that places sudden pressure on the disks that cushion the bones of your lower spine.  What increases the risk? This condition is more likely to develop in:  Males aged 30-50 years.  Females aged 50-60 years.  People who lift improperly.  People who are overweight or live a sedentary lifestyle.  People who smoke.  People who perform repetitive activities that strain the spine.  What are the signs or symptoms? Symptoms of this condition include:  Pain that goes down from the back into the legs (sciatica). This is the most common symptom. The pain may be worse with sitting, coughing, or sneezing.  Pain and numbness in the arms and legs.  Muscle weakness.  Tingling.  Loss of bladder control or bowel control.  How is this diagnosed? This condition is diagnosed with a physical exam and medical history. If the pain is lasting, you may have tests, such as:  MRI scan.  X-ray.  CT scan.  Myelogram.  Nerve conduction study.  How is this treated? This condition is often treated with:  Hot packs and ice applied to affected areas.  Stretches to improve flexibility.  Exercises to strengthen back muscles.  Physical therapy.  Pain medicine.  A steroid injection in the spine.  In some cases, no treatment is needed. If the condition is  long-lasting (chronic), or if symptoms are severe, treatment may involve surgery or lifestyle changes, such as following a weight loss plan. Follow these instructions at home: Medicines  Take medicines only as directed by your health care provider.  Do not drive or operate heavy machinery while taking pain medicine. Injury care  Apply a heat pack to the injured area as directed by your health care provider.  Apply ice to the affected area: ? Put ice in a plastic bag. ? Place a towel between your skin and the bag. ? Leave the ice on for 20-30 minutes, every 2 hours while you are awake or as needed. Or, leave the ice on for as long as directed by your health care provider. Other Instructions  If you were shown how to do any exercises or stretches, do them as directed by your health care provider.  If your health care provider prescribed a diet or exercise program, follow it as directed.  Keep all follow-up visits as directed by your health care provider. This is important. Contact a health care provider if:  Your pain does not improve over time even when taking pain medicines. Get help right away if:  Your develop severe pain.  Your pain suddenly gets worse.  You develop increasing weakness in your legs.  You lose the ability to control your bladder or bowel.  You have difficulty walking or balancing.  You have a fever. This information is not intended to replace advice given to you by your health  care provider. Make sure you discuss any questions you have with your health care provider. Document Released: 05/29/2005 Document Revised: 11/04/2015 Document Reviewed: 05/25/2014 Elsevier Interactive Patient Education  2018 [employer].  and/or Miralax as needed for any constipation.

## 2018-05-06 ENCOUNTER — Telehealth: Payer: Self-pay

## 2018-05-06 MED ORDER — HYDROCODONE-ACETAMINOPHEN 5-325 MG PO TABS: 1.0000 | ORAL_TABLET | Freq: Four times a day (QID) | ORAL | 0 refills | Status: DC | PRN

## 2018-05-06 NOTE — Telephone Encounter (Signed)
Spoke with [facility]. She states pt filled rx from [facility] on 04/29/18 #20 for 10 days. Pt is not eligible for refill until 05/09/18 but they will allow 2 day early fill. Based on current rx this is also a 10 day supply.  Spoke with pt and advised him of pharmacy information. He states he has been out of medication for several days. When asked if he was taking the medication as directed he said no, he has been taking 1-2 tabs q6h prn for pain. He states he asked for rx to be written as qhs but did not realize this would limit his quantity. When I explained current rx is also written this way he states this is unacceptable and will not be enough medication. He wants a new rx so he can take medication 1-2 tabs q6h as needed and wants a larger quantity to last him longer. He insisted message be sent to Dr. Caryl NeverBurchette to have this corrected.

## 2018-05-06 NOTE — Telephone Encounter (Signed)
Copied from CRM [id number]. Topic: General - Other >> May 06, 2018 12:25 PM Gaynelle AduPoole, Shalonda wrote: Reason for CRM: patient Is calling to request a call back in regards to a medication issue. He stated he spoke with the pharmacy and they advise the patient couldn't pick up the medication HYDROcodone-acetaminophen (NORCO/VICODIN) 5-325 MG tablet. He stated he is in pain and requesting a call back in regards to what he needs  to do. Please advise the patient requested a call back

## 2018-05-06 NOTE — Telephone Encounter (Signed)
I have corrected prescription to Vicodin 5/325 mg 1-2 po every 6 hours prn #30 and sent in new rx.

## 2018-05-15 DIAGNOSIS — S161XXA Strain of muscle, fascia and tendon at neck level, initial encounter: Secondary | ICD-10-CM | POA: Insufficient documentation

## 2018-05-15 DIAGNOSIS — S46002A Unspecified injury of muscle(s) and tendon(s) of the rotator cuff of left shoulder, initial encounter: Secondary | ICD-10-CM | POA: Insufficient documentation

## 2018-05-15 DIAGNOSIS — M542 Cervicalgia: Secondary | ICD-10-CM | POA: Diagnosis not present

## 2018-05-20 DIAGNOSIS — M545 Low back pain, unspecified: Secondary | ICD-10-CM | POA: Insufficient documentation

## 2018-05-20 DIAGNOSIS — M25512 Pain in left shoulder: Secondary | ICD-10-CM | POA: Diagnosis not present

## 2018-05-20 DIAGNOSIS — M25522 Pain in left elbow: Secondary | ICD-10-CM | POA: Diagnosis not present

## 2018-05-21 DIAGNOSIS — M542 Cervicalgia: Secondary | ICD-10-CM | POA: Diagnosis not present

## 2018-05-24 DIAGNOSIS — M542 Cervicalgia: Secondary | ICD-10-CM | POA: Diagnosis not present

## 2018-06-07 DIAGNOSIS — M542 Cervicalgia: Secondary | ICD-10-CM | POA: Diagnosis not present

## 2018-06-14 DIAGNOSIS — M542 Cervicalgia: Secondary | ICD-10-CM | POA: Diagnosis not present

## 2018-06-20 DIAGNOSIS — H9012 Conductive hearing loss, unilateral, left ear, with unrestricted hearing on the contralateral side: Secondary | ICD-10-CM | POA: Insufficient documentation

## 2018-06-20 DIAGNOSIS — H6522 Chronic serous otitis media, left ear: Secondary | ICD-10-CM | POA: Diagnosis not present

## 2018-06-20 DIAGNOSIS — H903 Sensorineural hearing loss, bilateral: Secondary | ICD-10-CM | POA: Diagnosis not present

## 2018-06-21 ENCOUNTER — Encounter: Payer: Self-pay | Admitting: Adult Health

## 2018-06-21 ENCOUNTER — Ambulatory Visit (INDEPENDENT_AMBULATORY_CARE_PROVIDER_SITE_OTHER): Payer: Medicare Other | Admitting: Adult Health

## 2018-06-21 VITALS — BP 128/84 | Temp 98.2°F | Wt 253.0 lb

## 2018-06-21 DIAGNOSIS — M109 Gout, unspecified: Secondary | ICD-10-CM | POA: Diagnosis not present

## 2018-06-21 MED ORDER — PREDNISONE 10 MG PO TABS: ORAL_TABLET | ORAL | 0 refills | Status: DC

## 2018-06-21 NOTE — Progress Notes (Signed)
Subjective:    Patient ID: Samuel [name], male    DOB: [birth year], 69 y.o.   MRN: [id number]  HPI  68 year old male who  has a past medical history of Aortic aneurysm (HCC), BICUSPID AORTIC VALVE (12/25/2008), Dyslipidemia, Gout, Hemorrhoids, HEMORRHOIDS-INTERNAL (12/20/2009), History of diverticulitis (03/2018), HYPERLIPIDEMIA-MIXED (04/01/2009), HYPOTHYROIDISM (08/26/2008), INSOMNIA, TRANSIENT (04/27/2009), Kidney stones, KNEE PAIN (11/02/2008), LATERAL EPICONDYLITIS (06/02/2009), Migraines, ONYCHOMYCOSIS (11/02/2008), PERSONAL HX COLONIC POLYPS (12/20/2009), Sudden visual loss (12/25/2008), and TRANSIENT ISCHEMIC ATTACK (01/25/2009).  He presents to the office today for recurrent gout flare. Started having pain, redness, warmth and swelling in left great toe yesterday. Was worse this morning when he woke up. Pain is worse with ambulation. Has been taking Allopurinol as directed. Believes alcohol consumption is contributing to his recurrent gout flares.   Review of Systems See HPI   Past Medical History:  Diagnosis Date  . Aortic aneurysm (HCC)   . 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 Dyslipidemia   . Gout   . Hemorrhoids   . HEMORRHOIDS-INTERNAL 12/20/2009  . History of diverticulitis 03/2018  . HYPERLIPIDEMIA-MIXED 04/01/2009  . HYPOTHYROIDISM 08/26/2008  . INSOMNIA, TRANSIENT 04/27/2009  . Kidney stones   . KNEE PAIN 11/02/2008  . LATERAL EPICONDYLITIS 06/02/2009  . Migraines   . ONYCHOMYCOSIS 11/02/2008  . PERSONAL HX COLONIC POLYPS 12/20/2009  . Sudden visual loss 12/25/2008  . TRANSIENT ISCHEMIC ATTACK 01/25/2009    Social History   Socioeconomic History  . Marital status: Married    Spouse name: Not on file  . Number of children: Not on file  . Years of education: Not on file  . Highest education level: Not on file  Occupational History  . Not on file  Social Needs  . Financial resource strain: Not on file  . Food insecurity:    Worry: Not on file    Inability: Not on file    . Transportation needs:    Medical: Not on file    Non-medical: Not on file  Tobacco Use  . Smoking status: Former Games developer  . Smokeless tobacco: Never Used  Substance and Sexual Activity  . Alcohol use: Yes    Alcohol/week: 14.0 standard drinks    Types: 14 Glasses of wine per week    Comment: couple glasses of wine each day  . Drug use: No  . Sexual activity: Not on file  Lifestyle  . Physical activity:    Days per week: Not on file    Minutes per session: Not on file  . Stress: Not on file  Relationships  . Social connections:    Talks on phone: Not on file    Gets together: Not on file    Attends religious service: Not on file    Active member of club or organization: Not on file    Attends meetings of clubs or organizations: Not on file    Relationship status: Not on file  . Intimate partner violence:    Fear of current or ex partner: Not on file    Emotionally abused: Not on file    Physically abused: Not on file    Forced sexual activity: Not on file  Other Topics Concern  . Not on file  Social History Narrative  . Not on file    Past Surgical History:  Procedure Laterality Date  . CYSTECTOMY     from abdominal wall  . 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 TONSILLECTOMY  Family History  Problem Relation Age of Onset  . Diabetes Mother   . Heart disease Father        valve problem    Allergies  Allergen Reactions  . Shellfish Allergy Anaphylaxis  . Penicillins     REACTION: childhood, reaction unknown    Current Outpatient Medications on File Prior to Visit  Medication Sig Dispense Refill  . allopurinol (ZYLOPRIM) 100 MG tablet TAKE 1 TABLET BY MOUTH EVERY DAY FOR 2 WEEKS, TAKE 2 tablets daily FOR 2 WEEKS, THEN TAKE 3 TABLETS daily 90 tablet 6  . levothyroxine (SYNTHROID, LEVOTHROID) 150 MCG tablet Take 1 tablet (150 mcg total) by mouth daily. 90 tablet 3  . warfarin (COUMADIN) 5 MG tablet Take 2 tablets by mouth daily or AS  DIRECTED BY ANTICOAGULATION CLINIC. 180 tablet 0  . warfarin (COUMADIN) 5 MG tablet Take 1 1/2 tablets daily except 2 tablets on Sun Tues and Thurs or AS DIRECTED BY ANTICOAGULATION CLINIC 150 tablet 0   No current facility-administered medications on file prior to visit.     BP 128/84   Temp 98.2 F (36.8 C)   Wt 253 lb (114.8 kg)   BMI 34.31 kg/m       Objective:   Physical Exam Vitals signs and nursing note reviewed.  Constitutional:      Appearance: Normal appearance.  Musculoskeletal:        General: Swelling and tenderness present.     Comments: Left great toe with edema, warmth, and redness noted. Pain with palpation   Skin:    General: Skin is warm and dry.     Findings: Erythema present.  Neurological:     Mental Status: He is alert.       Assessment & Plan:  1. Gout involving toe of left foot, unspecified cause, unspecified chronicity - predniSONE (DELTASONE) 10 MG tablet; 40 mg x 3 days, 20 mg x 3 days, 10 mg x 3 days  Dispense: 21 tablet; Refill: 0 - Follow up if no improvement  - Encouraged to stop drinking   Shirline Freesory Yvett Rossel, NP

## 2018-06-24 DIAGNOSIS — M542 Cervicalgia: Secondary | ICD-10-CM | POA: Diagnosis not present

## 2018-06-24 DIAGNOSIS — I10 Essential (primary) hypertension: Secondary | ICD-10-CM | POA: Insufficient documentation

## 2018-06-24 DIAGNOSIS — E069 Thyroiditis, unspecified: Secondary | ICD-10-CM | POA: Insufficient documentation

## 2018-06-25 ENCOUNTER — Other Ambulatory Visit: Payer: Self-pay | Admitting: General Practice

## 2018-06-26 ENCOUNTER — Ambulatory Visit (INDEPENDENT_AMBULATORY_CARE_PROVIDER_SITE_OTHER): Payer: Medicare Other | Admitting: General Practice

## 2018-06-26 DIAGNOSIS — Z7901 Long term (current) use of anticoagulants: Secondary | ICD-10-CM

## 2018-06-26 DIAGNOSIS — Q231 Congenital insufficiency of aortic valve: Secondary | ICD-10-CM

## 2018-06-26 LAB — POCT INR: INR: 2.1 (ref 2.0–3.0)

## 2018-06-26 NOTE — Patient Instructions (Signed)
Pre visit review using our clinic review tool, if applicable. No additional management support is needed unless otherwise documented below in the visit note.  Continue to take 2 tablets daily except 1 1/2 tablets on Monday/Wednesday/Fridays and Saturdays.  Re-check in 4 weeks.  Encouraged patient to keep diet steady. Call Bailey Mechindy Ernisha Sorn, RN when you get a date for the cortisone injections.  [phone].

## 2018-06-27 ENCOUNTER — Telehealth: Payer: Self-pay | Admitting: General Practice

## 2018-06-27 NOTE — Telephone Encounter (Signed)
-----   Message from Kristian Covey, MD sent at 06/26/2018  6:53 PM EST ----- Regarding: RE: Lovenox bridge Yes.  Thanks. ----- Message ----- From: Garrison Columbus, RN Sent: 06/26/2018   3:46 PM EST To: Kristian Covey, MD Subject: Lovenox bridge                                 Dr. Caryl Never,  Patient is going to have cortisone injections in his neck in the next couple of weeks.  I haven't heard from his orthopedic doctor yet but he will more than likely need to stop coumadin for 3 to 5 days.  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  Please advise.  Thanks! Bailey Mech, RN

## 2018-07-05 NOTE — Telephone Encounter (Signed)
Are you going to prescribe this medication?

## 2018-07-05 NOTE — Telephone Encounter (Signed)
I have already addressed with [facility].  He will not have to hold Coumadin until 07-13-18- so would have Arline Asp address when she gets back next week.  Make sure this note gets routed to her as a reminder.  Thanks.

## 2018-07-05 NOTE — Telephone Encounter (Signed)
Samuel [name] is on vacation until Wednesday and pt has not heard anything about what he should do before his procedure. He is scheduled for injections on Feb 6.

## 2018-07-08 NOTE — Telephone Encounter (Signed)
Called patient and let him know that Samuel [name] should hopefully reach out to him on Wednesday when she comes back. Patient verbalized an understanding.

## 2018-07-08 NOTE — Telephone Encounter (Signed)
Please see message. °

## 2018-07-08 NOTE — Telephone Encounter (Signed)
Pt called to follow up on rx and to make sure everything is in place. Please reach out to patient.

## 2018-07-10 ENCOUNTER — Telehealth: Payer: Self-pay | Admitting: Family Medicine

## 2018-07-10 ENCOUNTER — Telehealth: Payer: Self-pay | Admitting: General Practice

## 2018-07-10 ENCOUNTER — Other Ambulatory Visit (INDEPENDENT_AMBULATORY_CARE_PROVIDER_SITE_OTHER): Payer: Medicare Other

## 2018-07-10 DIAGNOSIS — M5412 Radiculopathy, cervical region: Secondary | ICD-10-CM | POA: Diagnosis not present

## 2018-07-10 DIAGNOSIS — Z79899 Other long term (current) drug therapy: Secondary | ICD-10-CM | POA: Diagnosis not present

## 2018-07-10 DIAGNOSIS — M48061 Spinal stenosis, lumbar region without neurogenic claudication: Secondary | ICD-10-CM | POA: Diagnosis not present

## 2018-07-10 LAB — BASIC METABOLIC PANEL
BUN: 13 mg/dL (ref 6–23)
CO2: 32 mEq/L (ref 19–32)
Calcium: 9.3 mg/dL (ref 8.4–10.5)
Chloride: 102 mEq/L (ref 96–112)
Creatinine, Ser: 0.92 mg/dL (ref 0.40–1.50)
GFR: 81.84 mL/min (ref >60.00)
GLUCOSE: 143 mg/dL — AB (ref 70–99)
Potassium: 3.8 mEq/L (ref 3.5–5.1)
Sodium: 140 mEq/L (ref 135–145)

## 2018-07-10 NOTE — Telephone Encounter (Signed)
-----   Message from Garrison Columbus, RN sent at 07/10/2018  8:52 AM EST ----- Regarding: BMET Dr. Caryl Never,  Good morning!  Patient needs Lovenox for a procedure on 2/6 but he is needs a current BMET before I can dose that.  Could you put in an order for that please?    Thanks! Arline Asp

## 2018-07-10 NOTE — Telephone Encounter (Signed)
Instructions for coumadin and Lovenox bridge pre and post procedure on 2/6.  2/1 - Last dose of coumadin until after procedure 2/2 - Nothing (No coumadin and No Lovenox) 2/3 - Lovenox in AM and PM (12 hours apart) 2/4 - Lovenox in AM and PM (12 hours apart) 2/5 - Lovenox in AM only (take by 7 am) 2/6 - Procedure (No Lovenox today) 2/7 - Lovenox in AM and PM AND 2 1/2 tablets of coumadin 2/8 - Lovenox in AM and PM AND 2 1/2 tablets of coumadin 2/9 - Lovenox in AM and PM AND 2 1/2 tablets of coumadin 2/10 - Lovenox in AM and PM and 2 tablets of coumadin 2/11 - Stop Lovenox and continue coumadin, 1 1/2 tablets daily except 2 tablets on Sun/Tues/Thurs. 2/12 - Check INR

## 2018-07-11 ENCOUNTER — Other Ambulatory Visit: Payer: Self-pay | Admitting: General Practice

## 2018-07-11 MED ORDER — ENOXAPARIN SODIUM 120 MG/0.8ML ~~LOC~~ SOLN: 120.0000 mg | Freq: Two times a day (BID) | SUBCUTANEOUS | 0 refills | Status: DC

## 2018-07-11 NOTE — Progress Notes (Signed)
Noted  

## 2018-07-12 ENCOUNTER — Other Ambulatory Visit: Payer: Self-pay | Admitting: Family Medicine

## 2018-07-12 NOTE — Progress Notes (Signed)
Noted  

## 2018-07-25 ENCOUNTER — Ambulatory Visit (INDEPENDENT_AMBULATORY_CARE_PROVIDER_SITE_OTHER): Payer: Medicare Other | Admitting: Pharmacist Clinician (PhC)/ Clinical Pharmacy Specialist

## 2018-07-25 DIAGNOSIS — M5030 Other cervical disc degeneration, unspecified cervical region: Secondary | ICD-10-CM | POA: Diagnosis not present

## 2018-07-25 DIAGNOSIS — M5136 Other intervertebral disc degeneration, lumbar region: Secondary | ICD-10-CM | POA: Diagnosis not present

## 2018-07-25 DIAGNOSIS — Z952 Presence of prosthetic heart valve: Secondary | ICD-10-CM

## 2018-07-25 DIAGNOSIS — Z7901 Long term (current) use of anticoagulants: Secondary | ICD-10-CM | POA: Diagnosis not present

## 2018-07-25 DIAGNOSIS — Q231 Congenital insufficiency of aortic valve: Secondary | ICD-10-CM

## 2018-07-25 LAB — POCT INR: INR: 1.1 — AB (ref 2.0–3.0)

## 2018-08-21 DIAGNOSIS — M5030 Other cervical disc degeneration, unspecified cervical region: Secondary | ICD-10-CM | POA: Diagnosis not present

## 2018-08-21 DIAGNOSIS — M5136 Other intervertebral disc degeneration, lumbar region: Secondary | ICD-10-CM | POA: Diagnosis not present

## 2018-08-21 DIAGNOSIS — M5412 Radiculopathy, cervical region: Secondary | ICD-10-CM | POA: Diagnosis not present

## 2018-08-27 ENCOUNTER — Other Ambulatory Visit: Payer: Self-pay | Admitting: Family Medicine

## 2018-08-28 ENCOUNTER — Ambulatory Visit (INDEPENDENT_AMBULATORY_CARE_PROVIDER_SITE_OTHER): Payer: Medicare Other | Admitting: General Practice

## 2018-08-28 ENCOUNTER — Other Ambulatory Visit: Payer: Self-pay

## 2018-08-28 DIAGNOSIS — Q231 Congenital insufficiency of aortic valve: Secondary | ICD-10-CM

## 2018-08-28 DIAGNOSIS — Z7901 Long term (current) use of anticoagulants: Secondary | ICD-10-CM | POA: Diagnosis not present

## 2018-08-28 LAB — POCT INR: INR: 2.3 (ref 2.0–3.0)

## 2018-08-28 NOTE — Patient Instructions (Signed)
Pre visit review using our clinic review tool, if applicable. No additional management support is needed unless otherwise documented below in the visit note. 

## 2018-08-29 ENCOUNTER — Telehealth: Payer: Self-pay | Admitting: Family Medicine

## 2018-08-29 NOTE — Telephone Encounter (Signed)
Copied from CRM [id number]. Topic: General - Other >> Aug 29, 2018 12:34 PM Tamela Oddi wrote: Reason for CRM: Patient called to ask the doctor to prescribe an antibiotic for him because he has a bad tooth that will need to be extracted.  Patient stated that he always has to take an antibiotic before a dental procedure.  He did not want to schedule an appointment for this.  Please advise and call patient when the script has been sent in.  CB# [phone]

## 2018-08-30 ENCOUNTER — Other Ambulatory Visit: Payer: Self-pay

## 2018-08-30 ENCOUNTER — Telehealth: Payer: Self-pay | Admitting: Family Medicine

## 2018-08-30 MED ORDER — CLINDAMYCIN HCL 300 MG PO CAPS: ORAL_CAPSULE | ORAL | 0 refills | Status: DC

## 2018-08-30 NOTE — Telephone Encounter (Signed)
Clindamycin 600 mg po times one dose one hour prior to dental procedure.

## 2018-08-30 NOTE — Telephone Encounter (Signed)
This is more complicated than appears on surface    He has PCN allergy and next best option would be Clindamycin- but he is on coumadin and clindamycin would be very high risk for effecting protime.     Unless he has very clear findings- gum erythema, pus, etc would try to avoid antibiotics because of risk.

## 2018-08-30 NOTE — Telephone Encounter (Signed)
Called patient and he stated that he can feel his heart beat in his tooth and he has taken both of these together in the past and just wanted to make sure it is OK to do root canal and come off of Warfarin and if OK to take big dose of Clindamycin? He is very concerned.  Please advise.  This is more complicated than appears on surface    He has PCN allergy and next best option would be Clindamycin- but he is on coumadin and clindamycin would be very high risk for effecting protime.     Unless he has very clear findings- gum erythema, pus, etc would try to avoid antibiotics because of risk.    Copied from CRM [id number]. Topic: General - Other >> Aug 29, 2018 12:34 PM Tamela Oddi wrote: Reason for CRM: Patient called to ask the doctor to prescribe an antibiotic for him because he has a bad tooth that will need to be extracted.  Patient stated that he always has to take an antibiotic before a dental procedure.  He did not want to schedule an appointment for this.  Please advise and call patient when the script has been sent in.  CB# [phone]

## 2018-08-30 NOTE — Telephone Encounter (Signed)
I left a message for the pt to return a call to the office.  CRM also created. 

## 2018-08-30 NOTE — Telephone Encounter (Signed)
Called patient and let him know that [facility] [place] and gave him message from Dr. Caryl Never. Patient verbalized an understanding.

## 2018-08-30 NOTE — Telephone Encounter (Signed)
Copied from CRM [id number]. Topic: Quick Communication - See Telephone Encounter >> Aug 30, 2018  1:59 PM Fanny Bien wrote: CRM for notification. See Telephone encounter for: 08/30/18. Pt would like a call back from the nurse regarding antibiotic request. Please advise

## 2018-09-10 ENCOUNTER — Other Ambulatory Visit: Payer: Self-pay

## 2018-09-10 ENCOUNTER — Ambulatory Visit (INDEPENDENT_AMBULATORY_CARE_PROVIDER_SITE_OTHER): Payer: Medicare Other | Admitting: Family Medicine

## 2018-09-10 DIAGNOSIS — Z0000 Encounter for general adult medical examination without abnormal findings: Secondary | ICD-10-CM | POA: Diagnosis not present

## 2018-09-10 MED ORDER — ALLOPURINOL 300 MG PO TABS: 300.0000 mg | ORAL_TABLET | Freq: Every day | ORAL | 3 refills | Status: DC

## 2018-09-10 NOTE — Progress Notes (Addendum)
Patient ID: Samuel [name], male   DOB: 11-Jan-1951, 68 y.o.   MRN: [id number]  Virtual Visit via Video Note  I connected with Samuel [name] on 09/10/18 at  1:15 PM EDT by a video enabled telemedicine application and verified that I am speaking with the correct person using two identifiers.  Location patient: home Location provider:work or home office Persons participating in the virtual visit: patient, provider  I discussed the limitations of evaluation and management by telemedicine and the availability of in person appointments. The patient expressed understanding and agreed to proceed.   HPI:  Patient here today for Medicare annual wellness visit.  He has history of aortic valve replacement, hypothyroidism, hyperlipidemia, gout, osteoarthritis involving multiple joints.  Multiple flareups with gout last year.  We started allopurinol and he has done very well over the past several months.  Requesting refills.  Health maintenance reviewed.  He needs hepatitis C level and also Prevnar 13.  He had some steroid injections in his back but did not see much improvement.  He is taking oxycodone per [facility] orthopedics at night and that seems to be helping his sleep.  1.  Risk factors based on Past Medical , Social, and Family history reviewed and as indicated above with no changes  2.  Limitations in physical activities None.  No recent falls.  Has good balance.  Did have one brief episode of dizziness while playing golf and this was very transient only lasting a couple minutes.  Otherwise, no dizziness.  3.  Depression/mood No active depression or anxiety issues denies any depression issues PHQ-2=0.   4.  Hearing-he has some hearing deficits and has been evaluated by ENT and they recommended he consider hearing aid at some point soon  5.  ADLs independent in all.  6.  Cognitive function (orientation to time and place, language, writing, speech,memory) no short or long term memory issues.   Language and judgement intact.  7.  Home Safety no issues  8.  Height, weight, and visual acuity.all stable.  9.  Counseling discussed need for Prevnar 13, annual flu vaccine, hepatitis C antibody  10. Recommendation of preventive services.  As above  11. Labs based on risk factors-needs follow-up labs including TSH, chemistries, CBC, lipids.  He will plan to try to get this scheduled a few months after current pandemic crisis has settled down  12. Care Plan-as above  13. Other Providers-Dr. Pollyann Kennedy ENT, Dr. Horald Chestnut orthopedics  14. Written schedule of screening discussed with patient.  -influenza- yearly -Hep C- needs -Prevnar 13- needs -Tetanus- due 11-06-23 -colonoscopy- due 10-21-24 -shingles vaccine (needs Shingrix)  15.  Advanced directives.  He has none  We discussed issues such as living will, DNR, medical power of attorney.  He will look into getting this started during the next year    ROS: See pertinent positives and negatives per HPI.  Past Medical History:  Diagnosis Date  . Aortic aneurysm (HCC)   . 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 Dyslipidemia   . Gout   . Hemorrhoids   . HEMORRHOIDS-INTERNAL 12/20/2009  . History of diverticulitis 03/2018  . HYPERLIPIDEMIA-MIXED 04/01/2009  . HYPOTHYROIDISM 08/26/2008  . INSOMNIA, TRANSIENT 04/27/2009  . Kidney stones   . KNEE PAIN 11/02/2008  . LATERAL EPICONDYLITIS 06/02/2009  . Migraines   . ONYCHOMYCOSIS 11/02/2008  . PERSONAL HX COLONIC POLYPS 12/20/2009  . Sudden visual loss 12/25/2008  . TRANSIENT ISCHEMIC ATTACK 01/25/2009    Past Surgical History:  Procedure Laterality Date  .  CYSTECTOMY     from abdominal wall  . 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 TONSILLECTOMY      Family History  Problem Relation Age of Onset  . Diabetes Mother   . Heart disease Father        valve problem    SOCIAL HX: Non-smoker.  Currently lives in [place], [place].  He is  retired.   Current Outpatient Medications:  .  allopurinol (ZYLOPRIM) 100 MG tablet, TAKE 1 TABLET BY MOUTH EVERY DAY FOR 2 WEEKS, TAKE 2 tablets daily FOR 2 WEEKS, THEN TAKE 3 TABLETS daily, Disp: 90 tablet, Rfl: 6 .  clindamycin (CLEOCIN) 300 MG capsule, Take 600 mg (2 capsules) one hour prior to dental procedure., Disp: 2 capsule, Rfl: 0 .  enoxaparin (LOVENOX) 120 MG/0.8ML injection, Inject 0.8 mLs (120 mg total) into the skin every 12 (twelve) hours., Disp: 13 Syringe, Rfl: 0 .  levothyroxine (SYNTHROID, LEVOTHROID) 150 MCG tablet, TAKE 1 TABLET BY MOUTH EVERY DAY, Disp: 90 tablet, Rfl: 3 .  predniSONE (DELTASONE) 10 MG tablet, 40 mg x 3 days, 20 mg x 3 days, 10 mg x 3 days, Disp: 21 tablet, Rfl: 0 .  warfarin (COUMADIN) 5 MG tablet, Take 1 1/2 tablets daily except 2 tablets on Sun, Tues and Thurs or AS DIRECTED BY ANTICOAGULATION CLINIC, Disp: 150 tablet, Rfl: 0  EXAM:  VITALS per patient if applicable:  GENERAL: alert, oriented, appears well and in no acute distress  HEENT: atraumatic, conjunttiva clear, no obvious abnormalities on inspection of external nose and ears  NECK: normal movements of the head and neck  LUNGS: on inspection no signs of respiratory distress, breathing rate appears normal, no obvious gross SOB, gasping or wheezing  CV: no obvious cyanosis  MS: moves all visible extremities without noticeable abnormality  PSYCH/NEURO: pleasant and cooperative, no obvious depression or anxiety, speech and thought processing grossly intact  ASSESSMENT AND PLAN:  Discussed the following assessment and plan:  Medicare annual wellness visit.  We discussed the following issues -Needs Prevnar 13 when our office is open back up -Needs hepatitis C antibody along with other labs including TSH, chemistries, CBC -Refill allopurinol 300 mg daily     I discussed the assessment and treatment plan with the patient. The patient was provided an opportunity to ask questions and all  were answered. The patient agreed with the plan and demonstrated an understanding of the instructions.   The patient was advised to call back or seek an in-person evaluation if the symptoms worsen or if the condition fails to improve as anticipated.     [name], MD

## 2018-09-16 DIAGNOSIS — M5412 Radiculopathy, cervical region: Secondary | ICD-10-CM | POA: Diagnosis not present

## 2018-10-09 ENCOUNTER — Ambulatory Visit (INDEPENDENT_AMBULATORY_CARE_PROVIDER_SITE_OTHER): Payer: Medicare Other | Admitting: General Practice

## 2018-10-09 DIAGNOSIS — Z7901 Long term (current) use of anticoagulants: Secondary | ICD-10-CM

## 2018-10-09 DIAGNOSIS — Q231 Congenital insufficiency of aortic valve: Secondary | ICD-10-CM

## 2018-10-09 NOTE — Patient Instructions (Incomplete)
Pre visit review using our clinic review tool, if applicable. No additional management support is needed unless otherwise documented below in the visit note. 

## 2018-11-07 DIAGNOSIS — H9012 Conductive hearing loss, unilateral, left ear, with unrestricted hearing on the contralateral side: Secondary | ICD-10-CM | POA: Diagnosis not present

## 2018-11-07 DIAGNOSIS — H6522 Chronic serous otitis media, left ear: Secondary | ICD-10-CM | POA: Diagnosis not present

## 2018-11-28 DIAGNOSIS — M545 Low back pain: Secondary | ICD-10-CM | POA: Diagnosis not present

## 2018-11-28 DIAGNOSIS — M5030 Other cervical disc degeneration, unspecified cervical region: Secondary | ICD-10-CM | POA: Diagnosis not present

## 2018-11-28 DIAGNOSIS — M5412 Radiculopathy, cervical region: Secondary | ICD-10-CM | POA: Diagnosis not present

## 2018-11-28 DIAGNOSIS — G894 Chronic pain syndrome: Secondary | ICD-10-CM | POA: Diagnosis not present

## 2018-12-04 DIAGNOSIS — M47812 Spondylosis without myelopathy or radiculopathy, cervical region: Secondary | ICD-10-CM | POA: Insufficient documentation

## 2018-12-09 DIAGNOSIS — H9012 Conductive hearing loss, unilateral, left ear, with unrestricted hearing on the contralateral side: Secondary | ICD-10-CM | POA: Diagnosis not present

## 2018-12-27 ENCOUNTER — Ambulatory Visit: Payer: Self-pay

## 2018-12-27 NOTE — Telephone Encounter (Signed)
Incoming call from Patient who report that his daughter has a positive covid-19 test.  Report that his daughter is practicing social distancing and wearing a mask .  Patient denies any Sx.  Encouraged Pt.  To call back if Sx.  Develop.  Will then make an virtual appointment. Reviewed protocol and provided care advise. Voiced understanding .       Reason for Disposition . COVID-19 Home Isolation, questions about  Answer Assessment - Initial Assessment Questions 1. COVID-19 DIAGNOSIS: "Who made your Coronavirus (COVID-19) diagnosis?" "Was it confirmed by a positive lab test?" If not diagnosed by a HCP, ask "Are there lots of cases (community spread) where you live?" (See public health department website, if unsure)     2. ONSET: "When did the COVID-19 symptoms start?"      denies 3. WORST SYMPTOM: "What is your worst symptom?" (e.g., cough, fever, shortness of breath, muscle aches)     denies 4. COUGH: "Do you have a cough?" If so, ask: "How bad is the cough?"       denies 5. FEVER: "Do you have a fever?" If so, ask: "What is your temperature, how was it measured, and when did it start?"    Denies  6. RESPIRATORY STATUS: "Describe your breathing?" (e.g., shortness of breath, wheezing, unable to speak)      denies 7. BETTER-SAME-WORSE: "Are you getting better, staying the same or getting worse compared to yesterday?"  If getting worse, ask, "In what way?"     same 8. HIGH RISK DISEASE: "Do you have any chronic medical problems?" (e.g., asthma, heart or lung disease, weak immune system, etc.)     Aortic arch artifical 9. PREGNANCY: "Is there any chance you are pregnant?" "When was your last menstrual period?"     na 10. OTHER SYMPTOMS: "Do you have any other symptoms?"  (e.g., chills, fatigue, headache, loss of smell or taste, muscle pain, sore throat)       *No Answer*  Protocols used: CORONAVIRUS (COVID-19) DIAGNOSED OR SUSPECTED-A-AH

## 2019-01-14 ENCOUNTER — Telehealth: Payer: Self-pay | Admitting: Family Medicine

## 2019-01-14 NOTE — Telephone Encounter (Signed)
Please see message.  Please advise. 

## 2019-01-14 NOTE — Telephone Encounter (Signed)
Copied from [place] [id number]. Topic: Appointment Scheduling - Scheduling Inquiry for Clinic >> Jan 14, 2019  2:23 PM Oneta Rack wrote: Patient is having a steroid cortisone shot in his cervical lumbar spine on 8/20 and was advised to wean off warfarin, patient seeking an appointment, please advise

## 2019-01-14 NOTE — Telephone Encounter (Signed)
I feel that he should be bridged with Lovenox- given hx of heart valve surgery.

## 2019-01-16 ENCOUNTER — Other Ambulatory Visit: Payer: Self-pay | Admitting: General Practice

## 2019-01-16 DIAGNOSIS — Z7901 Long term (current) use of anticoagulants: Secondary | ICD-10-CM

## 2019-01-16 MED ORDER — ENOXAPARIN SODIUM 100 MG/ML ~~LOC~~ SOLN: 100.0000 mg | Freq: Two times a day (BID) | SUBCUTANEOUS | 0 refills | Status: DC

## 2019-01-17 ENCOUNTER — Telehealth: Payer: Self-pay | Admitting: General Practice

## 2019-01-17 NOTE — Telephone Encounter (Signed)
Plan for coumadin and Lovenox bridge pre and post procedure on 8/20.  8/15 - Last dose of coumadin until after procedure 8/16 - Nothing (No coumadin and No Lovenox) 8/17 - Lovenox in the AM and PM (12 hours apart) 8/18 - Lovenox in the AM and PM 8/19 - Lovenox in the AM only (Take by 7 am) 8/20 - Procedure - (DO NOT TAKE LOVENOX TODAY) 8/21 - Lovenox in the AM and PM AND 2 1/2 tablets of coumadin 8/22 - Lovenox in the AM and PM AND 2 1/2 tablets of coumadin 8/23 - Lovenox in the AM and PM AND 2 tablets of coumadin 8/24 - Lovenox in the AM and PM and 2 tablets of coumadin 8/25 - Stop Lovenox and take 1 1/2 tablets of coumadin 8/26 - Check INR with [facility] [place]

## 2019-01-20 ENCOUNTER — Other Ambulatory Visit: Payer: Self-pay

## 2019-01-20 ENCOUNTER — Ambulatory Visit: Payer: Medicare Other | Admitting: General Practice

## 2019-01-20 DIAGNOSIS — Z7901 Long term (current) use of anticoagulants: Secondary | ICD-10-CM

## 2019-01-20 DIAGNOSIS — Q231 Congenital insufficiency of aortic valve: Secondary | ICD-10-CM

## 2019-01-20 LAB — POCT INR: INR: 1.8 — AB (ref 2.0–3.0)

## 2019-01-20 NOTE — Patient Instructions (Addendum)
Pre visit review using our clinic review tool, if applicable. No additional management support is needed unless otherwise documented below in the visit note.  Take coumadin through Saturday and then follow patient directions.    Plan for coumadin and Lovenox bridge pre and post procedure on 8/20.  8/15 - Last dose of coumadin until after procedure 8/16 - Nothing (No coumadin and No Lovenox) 8/17 - Lovenox in the AM and PM (12 hours apart) 8/18 - Lovenox in the AM and PM 8/19 - Lovenox in the AM only (Take by 7 am) 8/20 - Procedure - (DO NOT TAKE LOVENOX TODAY) 8/21 - Lovenox in the AM and PM AND 2 1/2 tablets of coumadin 8/22 - Lovenox in the AM and PM AND 2 1/2 tablets of coumadin 8/23 - Lovenox in the AM and PM AND 2 tablets of coumadin 8/24 - Lovenox in the AM AND re-check INR @ 11:45

## 2019-01-28 ENCOUNTER — Other Ambulatory Visit: Payer: Self-pay | Admitting: General Practice

## 2019-01-28 ENCOUNTER — Other Ambulatory Visit: Payer: Self-pay | Admitting: Family Medicine

## 2019-01-28 DIAGNOSIS — Z7901 Long term (current) use of anticoagulants: Secondary | ICD-10-CM

## 2019-01-28 MED ORDER — WARFARIN SODIUM 5 MG PO TABS: ORAL_TABLET | ORAL | 0 refills | Status: DC

## 2019-01-30 ENCOUNTER — Other Ambulatory Visit: Payer: Self-pay

## 2019-01-30 ENCOUNTER — Ambulatory Visit (INDEPENDENT_AMBULATORY_CARE_PROVIDER_SITE_OTHER): Payer: Medicare Other | Admitting: Pharmacist

## 2019-01-30 DIAGNOSIS — Z7901 Long term (current) use of anticoagulants: Secondary | ICD-10-CM

## 2019-01-30 DIAGNOSIS — Q231 Congenital insufficiency of aortic valve: Secondary | ICD-10-CM | POA: Diagnosis not present

## 2019-01-30 DIAGNOSIS — M5030 Other cervical disc degeneration, unspecified cervical region: Secondary | ICD-10-CM | POA: Diagnosis not present

## 2019-01-30 DIAGNOSIS — M5136 Other intervertebral disc degeneration, lumbar region: Secondary | ICD-10-CM | POA: Diagnosis not present

## 2019-01-30 LAB — POCT INR: INR: 1.1 — AB (ref 2.0–3.0)

## 2019-02-03 ENCOUNTER — Ambulatory Visit (INDEPENDENT_AMBULATORY_CARE_PROVIDER_SITE_OTHER): Payer: Medicare Other | Admitting: General Practice

## 2019-02-03 ENCOUNTER — Other Ambulatory Visit: Payer: Self-pay

## 2019-02-03 DIAGNOSIS — Z7901 Long term (current) use of anticoagulants: Secondary | ICD-10-CM | POA: Diagnosis not present

## 2019-02-03 LAB — POCT INR: INR: 1.4 — AB (ref 2.0–3.0)

## 2019-02-03 NOTE — Patient Instructions (Addendum)
Pre visit review using our clinic review tool, if applicable. No additional management support is needed unless otherwise documented below in the visit note.  Take 3 tablets today (8/24) and take 2 1/2 tablets on Tuesday.  On Wednesday continue to take 7.5 mg all days except 10 mg on Sun Tues and Thursday.  Re-check in 3 weeks.  Finish Lovenox injections. (Patient has been off coumadin for a spinal procedure).

## 2019-02-21 ENCOUNTER — Ambulatory Visit: Payer: Medicare Other | Admitting: Cardiology

## 2019-02-24 ENCOUNTER — Other Ambulatory Visit: Payer: Self-pay

## 2019-02-24 ENCOUNTER — Ambulatory Visit: Payer: Medicare Other

## 2019-02-24 ENCOUNTER — Ambulatory Visit (INDEPENDENT_AMBULATORY_CARE_PROVIDER_SITE_OTHER): Payer: Medicare Other | Admitting: General Practice

## 2019-02-24 DIAGNOSIS — Z7901 Long term (current) use of anticoagulants: Secondary | ICD-10-CM | POA: Diagnosis not present

## 2019-02-24 LAB — POCT INR: INR: 2.2 (ref 2.0–3.0)

## 2019-02-24 NOTE — Patient Instructions (Signed)
Pre visit review using our clinic review tool, if applicable. No additional management support is needed unless otherwise documented below in the visit note.  Continue to take 7.5 mg all days except 10 mg on Sun Tues and Thursday.  Re-check in 6 weeks.

## 2019-02-27 DIAGNOSIS — M5412 Radiculopathy, cervical region: Secondary | ICD-10-CM | POA: Insufficient documentation

## 2019-02-27 DIAGNOSIS — M47812 Spondylosis without myelopathy or radiculopathy, cervical region: Secondary | ICD-10-CM | POA: Diagnosis not present

## 2019-02-27 DIAGNOSIS — M5136 Other intervertebral disc degeneration, lumbar region: Secondary | ICD-10-CM | POA: Diagnosis not present

## 2019-02-27 DIAGNOSIS — M5030 Other cervical disc degeneration, unspecified cervical region: Secondary | ICD-10-CM | POA: Diagnosis not present

## 2019-02-27 DIAGNOSIS — M47816 Spondylosis without myelopathy or radiculopathy, lumbar region: Secondary | ICD-10-CM | POA: Insufficient documentation

## 2019-02-27 DIAGNOSIS — M47896 Other spondylosis, lumbar region: Secondary | ICD-10-CM | POA: Diagnosis not present

## 2019-03-01 ENCOUNTER — Other Ambulatory Visit: Payer: Self-pay | Admitting: Family Medicine

## 2019-03-01 DIAGNOSIS — M109 Gout, unspecified: Secondary | ICD-10-CM

## 2019-03-01 MED ORDER — PREDNISONE 10 MG PO TABS: ORAL_TABLET | ORAL | 0 refills | Status: DC

## 2019-03-01 NOTE — Progress Notes (Signed)
Call received from [place]. Patient experiencing a flare in his gout for the first time in about 9 months. Reviewed PMH and recent vital signs. Will allow a refill of the Prednisone taper he has taken previously and he is instructed to call the office for a follow up visit.

## 2019-03-03 ENCOUNTER — Telehealth: Payer: Self-pay | Admitting: *Deleted

## 2019-03-03 NOTE — Telephone Encounter (Signed)
Clinic RN called patient. Patient reports he is feeling a little better he is getting ready to take day 3 of Prednisone medication. Scheduled patient a virtual appointment for Friday 03/07/2019 at 3PM with PCP.

## 2019-03-03 NOTE — Telephone Encounter (Signed)
Clinic RN called patient. Patient reports he is feeling a little better he is getting ready to take day 3 of Prednisone medication. Scheduled patient a virtual appointment for Friday 03/07/2019 at 3PM with PCP.  

## 2019-03-03 NOTE — Telephone Encounter (Signed)
Patient called after hours line in regards to gout pain/flare up. Patient was prescribed Prednisone by on call physician (Dr. Randel Pigg). Patient was instructed to call office for a f/u.

## 2019-03-07 ENCOUNTER — Other Ambulatory Visit: Payer: Self-pay

## 2019-03-07 ENCOUNTER — Telehealth (INDEPENDENT_AMBULATORY_CARE_PROVIDER_SITE_OTHER): Payer: Medicare Other | Admitting: Family Medicine

## 2019-03-07 DIAGNOSIS — M109 Gout, unspecified: Secondary | ICD-10-CM | POA: Diagnosis not present

## 2019-03-07 MED ORDER — COLCHICINE 0.6 MG PO CAPS: ORAL_CAPSULE | ORAL | 3 refills | Status: DC

## 2019-03-07 NOTE — Progress Notes (Signed)
This visit type was conducted due to national recommendations for restrictions regarding the COVID-19 pandemic in an effort to limit this patient's exposure and mitigate transmission in our community.   Virtual Visit via Video Note  I connected with Samuel [name] on 03/07/19 at  3:00 PM EDT by a video enabled telemedicine application and verified that I am speaking with the correct person using two identifiers.  Location patient: home Location provider:work or home office Persons participating in the virtual visit: patient, provider  I discussed the limitations of evaluation and management by telemedicine and the availability of in person appointments. The patient expressed understanding and agreed to proceed.   HPI: Patient is seen with possible recent gout flare.  He is on allopurinol 300 mg daily and overall his gout has been much improved since taking that.  Uric acid last year was 5.4.  He states he played golf last week.  Around last Friday he noticed some mild swelling and redness and pain left first MTP joint.  Denies any recent injury.  He had prednisone called in and is on last day of that tomorrow.  He does feel this is improved since starting prednisone but he has concerns about coming off the prednisone.  He is on Coumadin for history of previous valve replacement.  He cannot take nonsteroidals.  Past Medical History:  Diagnosis Date  . Aortic aneurysm (HCC)   . 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 Dyslipidemia   . Gout   . Hemorrhoids   . HEMORRHOIDS-INTERNAL 12/20/2009  . History of diverticulitis 03/2018  . HYPERLIPIDEMIA-MIXED 04/01/2009  . HYPOTHYROIDISM 08/26/2008  . INSOMNIA, TRANSIENT 04/27/2009  . Kidney stones   . KNEE PAIN 11/02/2008  . LATERAL EPICONDYLITIS 06/02/2009  . Migraines   . ONYCHOMYCOSIS 11/02/2008  . PERSONAL HX COLONIC POLYPS 12/20/2009  . Sudden visual loss 12/25/2008  . TRANSIENT ISCHEMIC ATTACK 01/25/2009   Past Surgical History:  Procedure Laterality  Date  . CYSTECTOMY     from abdominal wall  . 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 TONSILLECTOMY      reports that he has quit smoking. He has never used smokeless tobacco. He reports current alcohol use of about 14.0 standard drinks of alcohol per week. He reports that he does not use drugs. family history includes Diabetes in his mother; Heart disease in his father. Allergies  Allergen Reactions  . Shellfish Allergy Anaphylaxis  . Penicillins     REACTION: childhood, reaction unknown      ROS: See pertinent positives and negatives per HPI.  Past Medical History:  Diagnosis Date  . Aortic aneurysm (HCC)   . 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 Dyslipidemia   . Gout   . Hemorrhoids   . HEMORRHOIDS-INTERNAL 12/20/2009  . History of diverticulitis 03/2018  . HYPERLIPIDEMIA-MIXED 04/01/2009  . HYPOTHYROIDISM 08/26/2008  . INSOMNIA, TRANSIENT 04/27/2009  . Kidney stones   . KNEE PAIN 11/02/2008  . LATERAL EPICONDYLITIS 06/02/2009  . Migraines   . ONYCHOMYCOSIS 11/02/2008  . PERSONAL HX COLONIC POLYPS 12/20/2009  . Sudden visual loss 12/25/2008  . TRANSIENT ISCHEMIC ATTACK 01/25/2009    Past Surgical History:  Procedure Laterality Date  . CYSTECTOMY     from abdominal wall  . 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 TONSILLECTOMY      Family History  Problem Relation Age of Onset  . Diabetes Mother   . Heart disease Father  valve problem    SOCIAL HX: Non-smoker   Current Outpatient Medications:  .  allopurinol (ZYLOPRIM) 100 MG tablet, TAKE 3 TABLETS daily, Disp: 90 tablet, Rfl: 6 .  allopurinol (ZYLOPRIM) 300 MG tablet, Take 1 tablet (300 mg total) by mouth daily., Disp: 90 tablet, Rfl: 3 .  enoxaparin (LOVENOX) 100 MG/ML injection, Inject 1 mL (100 mg total) into the skin every 12 (twelve) hours., Disp: 15 mL, Rfl: 0 .  levothyroxine (SYNTHROID) 150 MCG tablet, TAKE 1 TABLET BY MOUTH EVERY DAY, Disp: 90 tablet, Rfl: 2 .  predniSONE  (DELTASONE) 10 MG tablet, 40 mg x 3 days, 20 mg x 3 days, 10 mg x 3 days, Disp: 21 tablet, Rfl: 0 .  warfarin (COUMADIN) 5 MG tablet, TAKE ONE AND A HALF TABLETS BY MOUTH EVERY DAY EXCEPT TAKE 2 TABLETS ON SUNDAY, TUESDAY AND THURSDAY OR AS DIRECTED, Disp: 150 tablet, Rfl: 0  EXAM:  VITALS per patient if applicable:  GENERAL: alert, oriented, appears well and in no acute distress  HEENT: atraumatic, conjunttiva clear, no obvious abnormalities on inspection of external nose and ears  NECK: normal movements of the head and neck  LUNGS: on inspection no signs of respiratory distress, breathing rate appears normal, no obvious gross SOB, gasping or wheezing  CV: no obvious cyanosis  MS: moves all visible extremities without noticeable abnormality  PSYCH/NEURO: pleasant and cooperative, no obvious depression or anxiety, speech and thought processing grossly intact  ASSESSMENT AND PLAN:  Discussed the following assessment and plan:  Gout involving toe of left foot, unspecified cause, unspecified chronicity  -Continue allopurinol 300 mg daily for prophylaxis -Avoid nonsteroidals such as Indocin with his Coumadin therapy -Wrote for colchicine 0.6 mg to take 1 twice daily as needed for acute flareups in the future.  We also explained this could be taken as 1 daily short-term for assisting with prophylaxis     I discussed the assessment and treatment plan with the patient. The patient was provided an opportunity to ask questions and all were answered. The patient agreed with the plan and demonstrated an understanding of the instructions.   The patient was advised to call back or seek an in-person evaluation if the symptoms worsen or if the condition fails to improve as anticipated.    Carolann Littler, MD

## 2019-03-20 DIAGNOSIS — B351 Tinea unguium: Secondary | ICD-10-CM | POA: Diagnosis not present

## 2019-03-20 DIAGNOSIS — M79676 Pain in unspecified toe(s): Secondary | ICD-10-CM | POA: Diagnosis not present

## 2019-03-26 ENCOUNTER — Encounter: Payer: Self-pay | Admitting: Family Medicine

## 2019-03-26 ENCOUNTER — Other Ambulatory Visit: Payer: Self-pay

## 2019-03-26 ENCOUNTER — Ambulatory Visit (INDEPENDENT_AMBULATORY_CARE_PROVIDER_SITE_OTHER): Payer: Medicare Other | Admitting: Family Medicine

## 2019-03-26 VITALS — BP 118/78 | HR 83 | Temp 97.2°F | Resp 16 | Ht 72.0 in | Wt 258.0 lb

## 2019-03-26 DIAGNOSIS — E039 Hypothyroidism, unspecified: Secondary | ICD-10-CM

## 2019-03-26 DIAGNOSIS — R197 Diarrhea, unspecified: Secondary | ICD-10-CM | POA: Diagnosis not present

## 2019-03-26 NOTE — Patient Instructions (Signed)
Diarrhea, Adult Diarrhea is frequent loose and watery bowel movements. Diarrhea can make you feel weak and cause you to become dehydrated. Dehydration can make you tired and thirsty, cause you to have a dry mouth, and decrease how often you urinate. Diarrhea typically lasts 2-3 days. However, it can last longer if it is a sign of something more serious. It is important to treat your diarrhea as told by your health care provider. Follow these instructions at home: Eating and drinking     Follow these recommendations as told by your health care provider:  Take an oral rehydration solution (ORS). This is an over-the-counter medicine that helps return your body to its normal balance of nutrients and water. It is found at pharmacies and retail stores.  Drink plenty of fluids, such as water, ice chips, diluted fruit juice, and low-calorie sports drinks. You can drink milk also, if desired.  Avoid drinking fluids that contain a lot of sugar or caffeine, such as energy drinks, sports drinks, and soda.  Eat bland, easy-to-digest foods in small amounts as you are able. These foods include bananas, applesauce, rice, lean meats, toast, and crackers.  Avoid alcohol.  Avoid spicy or fatty foods.  Medicines  Take over-the-counter and prescription medicines only as told by your health care provider.  If you were prescribed an antibiotic medicine, take it as told by your health care provider. Do not stop using the antibiotic even if you start to feel better. General instructions   Wash your hands often using soap and water. If soap and water are not available, use a hand sanitizer. Others in the household should wash their hands as well. Hands should be washed: ? After using the toilet or changing a diaper. ? Before preparing, cooking, or serving food. ? While caring for a sick person or while visiting someone in a hospital.  Drink enough fluid to keep your urine pale yellow.  Rest at home while  you recover.  Watch your condition for any changes.  Take a warm bath to relieve any burning or pain from frequent diarrhea episodes.  Keep all follow-up visits as told by your health care provider. This is important. Contact a health care provider if:  You have a fever.  Your diarrhea gets worse.  You have new symptoms.  You cannot keep fluids down.  You feel light-headed or dizzy.  You have a headache.  You have muscle cramps. Get help right away if:  You have chest pain.  You feel extremely weak or you faint.  You have bloody or black stools or stools that look like tar.  You have severe pain, cramping, or bloating in your abdomen.  You have trouble breathing or you are breathing very quickly.  Your heart is beating very quickly.  Your skin feels cold and clammy.  You feel confused.  You have signs of dehydration, such as: ? Dark urine, very little urine, or no urine. ? Cracked lips. ? Dry mouth. ? Sunken eyes. ? Sleepiness. ? Weakness. Summary  Diarrhea is frequent loose and watery bowel movements. Diarrhea can make you feel weak and cause you to become dehydrated.  Drink enough fluids to keep your urine pale yellow.  Make sure that you wash your hands after using the toilet. If soap and water are not available, use hand sanitizer.  Contact a health care provider if your diarrhea gets worse or you have new symptoms.  Get help right away if you have signs of dehydration. This   information is not intended to replace advice given to you by your health care provider. Make sure you discuss any questions you have with your health care provider. Document Released: 05/19/2002 Document Revised: 11/02/2017 Document Reviewed: 11/02/2017 Elsevier Patient Education  [place].  Consider lactose free diet for next few weeks.  Reduce alcohol consumption.

## 2019-03-26 NOTE — Progress Notes (Signed)
Subjective:     Patient ID: Samuel [name], male   DOB: Aug 10, 1950, 68 y.o.   MRN: [id number]  HPI   [name] is seen with about 4-week history of some loose to occasionally watery stools.  Usually about 3 to 4/day.  No antibiotic use other than one-time dose of amoxicillin prior to dental procedure because of his aortic valve replacement history.  He has occasional diffuse abdominal cramps.  He had colonoscopy 2016 which showed extensive diverticulosis.  He is not a recent fevers or chills.  Did have one episode of blood but has had internal hemorrhoids in the past.  Denies any appetite changes.  He had some mild weight gain over the past year.  He states he is drinking about 3 to 4 glasses of wine per day.  No history of lactose or gluten intolerance.  No change in medications.  He has hypothyroidism and is on replacement.  He is overdue for thyroid functions.  Also, he has been taking some colchicine still even though we have on allopurinol for prevention.  He is not had gout flareups since last phone visit  Past Medical History:  Diagnosis Date  . Aortic aneurysm (HCC)   . 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 Dyslipidemia   . Gout   . Hemorrhoids   . HEMORRHOIDS-INTERNAL 12/20/2009  . History of diverticulitis 03/2018  . HYPERLIPIDEMIA-MIXED 04/01/2009  . HYPOTHYROIDISM 08/26/2008  . INSOMNIA, TRANSIENT 04/27/2009  . Kidney stones   . KNEE PAIN 11/02/2008  . LATERAL EPICONDYLITIS 06/02/2009  . Migraines   . ONYCHOMYCOSIS 11/02/2008  . PERSONAL HX COLONIC POLYPS 12/20/2009  . Sudden visual loss 12/25/2008  . TRANSIENT ISCHEMIC ATTACK 01/25/2009   Past Surgical History:  Procedure Laterality Date  . CYSTECTOMY     from abdominal wall  . 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 TONSILLECTOMY      reports that he has quit smoking. He has never used smokeless tobacco. He reports current alcohol use of about 14.0 standard drinks of alcohol per week. He reports that he does not use  drugs. family history includes Diabetes in his mother; Heart disease in his father. Allergies  Allergen Reactions  . Shellfish Allergy Anaphylaxis  . Penicillins     REACTION: childhood, reaction unknown     Review of Systems  Constitutional: Negative for appetite change, chills, fever and unexpected weight change.  Respiratory: Negative for cough and shortness of breath.   Cardiovascular: Negative for chest pain.  Gastrointestinal: Positive for diarrhea. Negative for nausea and vomiting.  Genitourinary: Negative for dysuria.  Neurological: Negative for dizziness.       Objective:   Physical Exam Vitals signs reviewed.  Constitutional:      Appearance: He is well-developed.  Cardiovascular:     Rate and Rhythm: Normal rate and regular rhythm.  Pulmonary:     Effort: Pulmonary effort is normal.     Breath sounds: Normal breath sounds.  Abdominal:     General: Abdomen is flat. Bowel sounds are normal. There is no distension.     Palpations: Abdomen is soft. There is no hepatomegaly, splenomegaly or mass.     Tenderness: There is no abdominal tenderness.  Neurological:     Mental Status: He is alert.        Assessment:     #1 persistent diarrhea now for about 4 weeks duration.  Possibly exacerbated by colchicine.  No red flags such as weight loss, fever, persistent  bloody stools.  Colonoscopy 4 years ago as above  #2 hypothyroidism.  Overdue for labs  #3 history of aortic valve replacement on chronic anticoagulation    Plan:     -Check further labs with TSH, CBC, comprehensive metabolic panel, GI pathogen panel, tissue transglutaminase IgA  -Discontinue colchicine  -Consider trial of lactose-free diet  -If all the above normal and symptoms persist consider follow-up with GI to further assess  Eulas Post MD [facility] at [facility]

## 2019-03-27 ENCOUNTER — Other Ambulatory Visit: Payer: Medicare Other

## 2019-03-27 DIAGNOSIS — R197 Diarrhea, unspecified: Secondary | ICD-10-CM | POA: Diagnosis not present

## 2019-03-28 LAB — TISSUE TRANSGLUTAMINASE, IGA: (tTG) Ab, IgA: 1 U/mL

## 2019-03-28 LAB — CBC WITH DIFFERENTIAL/PLATELET
Basophils Absolute: 0.1 10*3/uL (ref 0.0–0.1)
Basophils Relative: 1.1 % (ref 0.0–3.0)
Eosinophils Absolute: 0 10*3/uL (ref 0.0–0.7)
Eosinophils Relative: 1 % (ref 0.0–5.0)
HCT: 48.1 % (ref 39.0–52.0)
Hemoglobin: 16.7 g/dL (ref 13.0–17.0)
Lymphocytes Relative: 29.1 % (ref 12.0–46.0)
Lymphs Abs: 1.4 10*3/uL (ref 0.7–4.0)
MCHC: 34.7 g/dL (ref 30.0–36.0)
MCV: 96.6 fl (ref 78.0–100.0)
Monocytes Absolute: 0.7 10*3/uL (ref 0.1–1.0)
Monocytes Relative: 14.3 % — ABNORMAL HIGH (ref 3.0–12.0)
Neutro Abs: 2.6 10*3/uL (ref 1.4–7.7)
Neutrophils Relative %: 54.5 % (ref 43.0–77.0)
Platelets: 175 10*3/uL (ref 150.0–400.0)
RBC: 4.98 Mil/uL (ref 4.22–5.81)
RDW: 13.1 % (ref 11.5–15.5)
WBC: 4.9 10*3/uL (ref 4.0–10.5)

## 2019-03-28 LAB — COMPREHENSIVE METABOLIC PANEL
ALT: 158 U/L — ABNORMAL HIGH (ref 0–53)
AST: 102 U/L — ABNORMAL HIGH (ref 0–37)
Albumin: 4.8 g/dL (ref 3.5–5.2)
Alkaline Phosphatase: 58 U/L (ref 39–117)
BUN: 15 mg/dL (ref 6–23)
CO2: 27 mEq/L (ref 19–32)
Calcium: 9.1 mg/dL (ref 8.4–10.5)
Chloride: 101 mEq/L (ref 96–112)
Creatinine, Ser: 0.87 mg/dL (ref 0.40–1.50)
GFR: 87.11 mL/min (ref >60.00)
Glucose, Bld: 94 mg/dL (ref 70–99)
Potassium: 4.1 mEq/L (ref 3.5–5.1)
Sodium: 136 mEq/L (ref 135–145)
Total Bilirubin: 1.5 mg/dL — ABNORMAL HIGH (ref 0.2–1.2)
Total Protein: 7 g/dL (ref 6.0–8.3)

## 2019-03-28 LAB — GASTROINTESTINAL PATHOGEN PANEL PCR

## 2019-03-28 LAB — TSH: TSH: 2.76 u[IU]/mL (ref 0.35–4.50)

## 2019-04-04 ENCOUNTER — Encounter (INDEPENDENT_AMBULATORY_CARE_PROVIDER_SITE_OTHER): Payer: Self-pay

## 2019-04-06 LAB — GASTROINTESTINAL PATHOGEN PNL
CampyloBacter Group: NOT DETECTED
Norovirus GI/GII: NOT DETECTED
Rotavirus A: NOT DETECTED
Salmonella species: NOT DETECTED
Shiga Toxin 1: NOT DETECTED
Shiga Toxin 2: NOT DETECTED
Shigella Species: NOT DETECTED
Vibrio Group: NOT DETECTED
Yersinia enterocolitica: NOT DETECTED

## 2019-04-07 ENCOUNTER — Ambulatory Visit: Payer: Medicare Other

## 2019-04-27 NOTE — Progress Notes (Signed)
Cardiology Office Note   Date:  04/28/2019   ID:  Samuel [name], DOB Jan 11, 1951, MRN [id number]  PCP:  Kristian Covey, MD  Cardiologist:   Dietrich Pates, MD    Pt returns for f/u of AV prosthesis   History of Present Illness: Samuel [name] is a 68 y.o. male with a history of  AV dz  (bicuspid AV ) and asc aortic aneurysm   I saw him around 2010  He underwent AVR (ST Jude mechanical )with root replacememnt at that time I saw the pt in 2018  The pt says his breathing is OK   He hs some tightness in his chest that is not associated with actvity   He says he has had since surgery   No Change  Denies dizziness  No palpitaitons    INR is followed at Dr Lucie Leather office     Current Meds  Medication Sig  . allopurinol (ZYLOPRIM) 100 MG tablet TAKE 3 TABLETS daily  . allopurinol (ZYLOPRIM) 300 MG tablet Take 1 tablet (300 mg total) by mouth daily.  . Colchicine 0.6 MG CAPS Take 1 tablet twice daily as needed for acute gout  . levothyroxine (SYNTHROID) 150 MCG tablet TAKE 1 TABLET BY MOUTH EVERY DAY  . warfarin (COUMADIN) 5 MG tablet TAKE ONE AND A HALF TABLETS BY MOUTH EVERY DAY EXCEPT TAKE 2 TABLETS ON SUNDAY, TUESDAY AND THURSDAY OR AS DIRECTED     Allergies:   Shellfish allergy and Penicillins   Past Medical History:  Diagnosis Date  . Aortic aneurysm (HCC)   . 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 Dyslipidemia   . Gout   . Hemorrhoids   . HEMORRHOIDS-INTERNAL 12/20/2009  . History of diverticulitis 03/2018  . HYPERLIPIDEMIA-MIXED 04/01/2009  . HYPOTHYROIDISM 08/26/2008  . INSOMNIA, TRANSIENT 04/27/2009  . Kidney stones   . KNEE PAIN 11/02/2008  . LATERAL EPICONDYLITIS 06/02/2009  . Migraines   . ONYCHOMYCOSIS 11/02/2008  . PERSONAL HX COLONIC POLYPS 12/20/2009  . Sudden visual loss 12/25/2008  . TRANSIENT ISCHEMIC ATTACK 01/25/2009    Past Surgical History:  Procedure Laterality Date  . CYSTECTOMY     from abdominal wall  . MYRINGOTOMY WITH TUBE PLACEMENT Left   .  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 TONSILLECTOMY       Social History:  The patient  reports that he has quit smoking. He has never used smokeless tobacco. He reports current alcohol use of about 14.0 standard drinks of alcohol per week. He reports that he does not use drugs.   Family History:  The patient's family history includes Diabetes in his mother; Heart disease in his father.    ROS:  Please see the history of present illness. All other systems are reviewed and  Negative to the above problem except as noted.    PHYSICAL EXAM: VS:  BP 128/88   Pulse 64   Ht 6\' 1"  (1.854 m)   Wt 252 lb 3.2 oz (114.4 kg)   BMI 33.27 kg/m   GEN: Well nourished, well developed, in no acute distress  HEENT: normal  Neck: no JVD, carotid bruits Cardiac: RRR;  Crisp valve sounds best heard at apex     ,no edema  Respiratory:  clear to auscultation bilaterally, normal work of breathing GI: soft, nontender, nondistended, + BS  No hepatomegaly  MS: no deformity Moving all extremities   Skin: warm and dry, no rash Neuro:  Strength and  sensation are intact Psych: euthymic mood, full affect   EKG:  EKG is ordered today.  SR 64 bpm  Nonspecific ST changes     Lipid Panel    Component Value Date/Time   CHOL 188 04/27/2017 1157   TRIG 82 04/27/2017 1157   HDL 46 04/27/2017 1157   CHOLHDL 4.1 04/27/2017 1157   CHOLHDL 5 11/11/2014 0923   VLDL 19.2 11/11/2014 0923   LDLCALC 126 (H) 04/27/2017 1157   LDLDIRECT 137.9 06/21/2012 0851      Wt Readings from Last 3 Encounters:  04/28/19 252 lb 3.2 oz (114.4 kg)  03/26/19 258 lb (117 kg)  06/21/18 253 lb (114.8 kg)      ASSESSMENT AND PLAN:  1  AV disease  Valve sounds are good  Repeat echo to eval AV and alos aorta    Will check INR today   He is getting followed erratically at Dr Erick Blinks offic  2  HCM  Will set up for fasting lipids when he returns for echo  F?U in 12 months  Current medicines are reviewed at  length with the patient today.  The patient does not have concerns regarding medicines.  Signed, Dorris Carnes, MD  04/28/2019 4:43 PM    [address], [place], [place]  [address] Phone: [phone]; Fax: [phone]

## 2019-04-28 ENCOUNTER — Other Ambulatory Visit: Payer: Self-pay

## 2019-04-28 ENCOUNTER — Encounter: Payer: Self-pay | Admitting: Internal Medicine

## 2019-04-28 ENCOUNTER — Ambulatory Visit (INDEPENDENT_AMBULATORY_CARE_PROVIDER_SITE_OTHER): Payer: Medicare Other | Admitting: Internal Medicine

## 2019-04-28 VITALS — BP 128/88 | HR 64 | Ht 73.0 in | Wt 252.2 lb

## 2019-04-28 DIAGNOSIS — I359 Nonrheumatic aortic valve disorder, unspecified: Secondary | ICD-10-CM | POA: Diagnosis not present

## 2019-04-28 DIAGNOSIS — E782 Mixed hyperlipidemia: Secondary | ICD-10-CM

## 2019-04-28 NOTE — Patient Instructions (Signed)
Medication Instructions:  NO CHANGES *If you need a refill on your cardiac medications before your next appointment, please call your pharmacy*  Lab Work: TODAY; LIPIDS/INR If you have labs (blood work) drawn today and your tests are completely normal, you will receive your results only by: Marland Kitchen MyChart Message (if you have MyChart) OR . A paper copy in the mail If you have any lab test that is abnormal or we need to change your treatment, we will call you to review the results.  Testing/Procedures: Your physician has requested that you have an echocardiogram. Echocardiography is a painless test that uses sound waves to create images of your heart. It provides your doctor with information about the size and shape of your heart and how well your heart's chambers and valves are working. This procedure takes approximately one hour. There are no restrictions for this procedure.   Follow-Up: At [facility], you and your health needs are our priority.  As part of our continuing mission to provide you with exceptional heart care, we have created designated Provider Care Teams.  These Care Teams include your primary Cardiologist (physician) and Advanced Practice Providers (APPs -  Physician Assistants and Nurse Practitioners) who all work together to provide you with the care you need, when you need it.  Your next appointment:   12 months  The format for your next appointment:   In Person  Provider:   Dorris Carnes, MD  Other Instructions

## 2019-04-29 ENCOUNTER — Ambulatory Visit (INDEPENDENT_AMBULATORY_CARE_PROVIDER_SITE_OTHER): Payer: Medicare Other | Admitting: General Practice

## 2019-04-29 DIAGNOSIS — Z7901 Long term (current) use of anticoagulants: Secondary | ICD-10-CM | POA: Diagnosis not present

## 2019-04-29 LAB — PROTIME-INR
INR: 1.7 — ABNORMAL HIGH (ref 0.9–1.2)
Prothrombin Time: 17.8 s — ABNORMAL HIGH (ref 9.1–12.0)

## 2019-04-29 LAB — LIPID PANEL
Chol/HDL Ratio: 4.6 ratio (ref 0.0–5.0)
Cholesterol, Total: 160 mg/dL (ref 100–199)
HDL: 35 mg/dL — ABNORMAL LOW (ref >39)
LDL Chol Calc (NIH): 104 mg/dL — ABNORMAL HIGH (ref 0–99)
Triglycerides: 115 mg/dL (ref 0–149)
VLDL Cholesterol Cal: 21 mg/dL (ref 5–40)

## 2019-04-29 NOTE — Patient Instructions (Signed)
Pre visit review using our clinic review tool, if applicable. No additional management support is needed unless otherwise documented below in the visit note.  Take 2 1/2 tablets today and then continue to take 7.5 mg all days except 10 mg on Sun Tues and Thursday.  Re-check in 3 weeks.

## 2019-04-29 NOTE — Progress Notes (Signed)
Medical screening examination/treatment/procedure(s) were performed by non-physician practitioner and as supervising physician I was immediately available for consultation/collaboration. I agree with above. James John, MD   

## 2019-04-30 ENCOUNTER — Encounter: Payer: Self-pay | Admitting: Family Medicine

## 2019-04-30 ENCOUNTER — Other Ambulatory Visit: Payer: Self-pay

## 2019-04-30 ENCOUNTER — Ambulatory Visit (INDEPENDENT_AMBULATORY_CARE_PROVIDER_SITE_OTHER): Payer: Medicare Other | Admitting: Family Medicine

## 2019-04-30 ENCOUNTER — Telehealth: Payer: Self-pay

## 2019-04-30 VITALS — BP 126/80 | HR 72 | Temp 97.3°F | Ht 73.0 in | Wt 250.6 lb

## 2019-04-30 DIAGNOSIS — L570 Actinic keratosis: Secondary | ICD-10-CM

## 2019-04-30 DIAGNOSIS — R7401 Elevation of levels of liver transaminase levels: Secondary | ICD-10-CM

## 2019-04-30 NOTE — Telephone Encounter (Signed)
Patient was seen today.  Copied from [place] [id number]. Topic: General - Other >> Apr 30, 2019  1:33 PM Celene Kras wrote: Reason for CRM: Pt called stating he has a gross on his arm and is requesting to be seen by PCP at 3 today. Please advise.

## 2019-04-30 NOTE — Progress Notes (Signed)
Subjective:     Patient ID: Samuel [name], male   DOB: 1950-11-01, 68 y.o.   MRN: [id number]  HPI  Samuel [name] today seen for the following issues  Left forearm skin lesion which has been present for several months.  Slowly growing in size.  Scaly and well demarcated border.  No pain.  No itching.  He also has couple scaly areas on his scalp.  No prior history of skin cancer.  Wears cap or head covering usually when outside.  Recent elevated liver transaminases.  He had been drinking alcohol fairly consistently but has held off over the past several weeks and has already lost 8 pounds.  We had recommended follow-up liver transaminases within 2 months.  Denies any prior history of liver difficulties.  No family history of hemochromatosis.  No statin use. No recent fever, chills, abdominal pain  Past Medical History:  Diagnosis Date  . Aortic aneurysm ([place])   . 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 Dyslipidemia   . Gout   . Hemorrhoids   . HEMORRHOIDS-INTERNAL 12/20/2009  . History of diverticulitis 03/2018  . HYPERLIPIDEMIA-MIXED 04/01/2009  . HYPOTHYROIDISM 08/26/2008  . INSOMNIA, TRANSIENT 04/27/2009  . Kidney stones   . KNEE PAIN 11/02/2008  . LATERAL EPICONDYLITIS 06/02/2009  . Migraines   . ONYCHOMYCOSIS 11/02/2008  . PERSONAL HX COLONIC POLYPS 12/20/2009  . Sudden visual loss 12/25/2008  . TRANSIENT ISCHEMIC ATTACK 01/25/2009   Past Surgical History:  Procedure Laterality Date  . CYSTECTOMY     from abdominal wall  . MYRINGOTOMY WITH TUBE PLACEMENT Left   . 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 TONSILLECTOMY      reports that he has quit smoking. He has never used smokeless tobacco. He reports current alcohol use of about 14.0 standard drinks of alcohol per week. He reports that he does not use drugs. family history includes Diabetes in his mother; Heart disease in his father. Allergies  Allergen Reactions  . Shellfish Allergy Anaphylaxis  . Penicillins     REACTION:  childhood, reaction unknown   Wt Readings from Last 3 Encounters:  04/30/19 250 lb 9.6 oz (113.7 kg)  04/28/19 252 lb 3.2 oz (114.4 kg)  03/26/19 258 lb (117 kg)     Review of Systems  Constitutional: Negative for fatigue.  Eyes: Negative for visual disturbance.  Respiratory: Negative for cough, chest tightness and shortness of breath.   Cardiovascular: Negative for chest pain, palpitations and leg swelling.  Neurological: Negative for dizziness, syncope, weakness, light-headedness and headaches.       Objective:   Physical Exam Constitutional:      Appearance: Normal appearance.  Cardiovascular:     Rate and Rhythm: Normal rate and regular rhythm.  Skin:    Comments: He has scaly hyperkeratotic area mid parietal region which is approximately 3 x 4 mm.  No ulceration.  Dorsal aspect left forearm he has approximately 9 x 10 mm slightly raised scaly lesion with well-demarcated border  Neurological:     Mental Status: He is alert.        Assessment:     #1 probable benign seborrheic keratosis left forearm  #2 actinic keratosis scalp  #3 elevated liver transaminases from recent labs.  Deferential would include fatty liver changes.  Given ratio of AST/ALT not convinced this is primarily alcohol related- though we explained relevance of ETOH use to fatty liver disease.    Plan:     -Continue weight loss  and abstinence from alcohol.  We recommended follow-up hepatic panel in 1 month.  If enzymes not declining with his weight loss and alcohol reduction at that point consider further evaluation  -We discussed risk and benefits minutes of liquid nitrogen therapy to both the left forearm and scalp lesions.  We discussed risk including pain, blistering, infection and patient consented.  Both lesions were treated without difficulty and he tolerated well.  Be in touch if lesions not gone in 2 weeks.  Samuel Covey MD [facility] at [facility]

## 2019-05-05 ENCOUNTER — Other Ambulatory Visit: Payer: Self-pay

## 2019-05-21 ENCOUNTER — Ambulatory Visit (HOSPITAL_COMMUNITY): Payer: Medicare Other | Attending: Internal Medicine

## 2019-05-21 ENCOUNTER — Other Ambulatory Visit: Payer: Self-pay

## 2019-05-21 ENCOUNTER — Other Ambulatory Visit (INDEPENDENT_AMBULATORY_CARE_PROVIDER_SITE_OTHER): Payer: Medicare Other

## 2019-05-21 ENCOUNTER — Ambulatory Visit (INDEPENDENT_AMBULATORY_CARE_PROVIDER_SITE_OTHER): Payer: Medicare Other | Admitting: General Practice

## 2019-05-21 DIAGNOSIS — E782 Mixed hyperlipidemia: Secondary | ICD-10-CM | POA: Diagnosis not present

## 2019-05-21 DIAGNOSIS — I359 Nonrheumatic aortic valve disorder, unspecified: Secondary | ICD-10-CM | POA: Insufficient documentation

## 2019-05-21 DIAGNOSIS — R7401 Elevation of levels of liver transaminase levels: Secondary | ICD-10-CM | POA: Diagnosis not present

## 2019-05-21 DIAGNOSIS — Z7901 Long term (current) use of anticoagulants: Secondary | ICD-10-CM

## 2019-05-21 LAB — HEPATIC FUNCTION PANEL
ALT: 119 U/L — ABNORMAL HIGH (ref 0–53)
AST: 69 U/L — ABNORMAL HIGH (ref 0–37)
Albumin: 4.3 g/dL (ref 3.5–5.2)
Alkaline Phosphatase: 60 U/L (ref 39–117)
Bilirubin, Direct: 0.2 mg/dL (ref 0.0–0.3)
Total Bilirubin: 1.1 mg/dL (ref 0.2–1.2)
Total Protein: 6.3 g/dL (ref 6.0–8.3)

## 2019-05-21 LAB — POCT INR: INR: 2.2 (ref 2.0–3.0)

## 2019-05-21 NOTE — Patient Instructions (Signed)
Pre visit review using our clinic review tool, if applicable. No additional management support is needed unless otherwise documented below in the visit note.  Take 2 1/2 tablets today and then continue to take 7.5 mg all days except 10 mg on Sun Tues and Thursday.  Re-check in 6 weeks.

## 2019-05-22 ENCOUNTER — Telehealth: Payer: Self-pay | Admitting: *Deleted

## 2019-05-22 NOTE — Telephone Encounter (Signed)
Returned call to patient, gave lab results. Patient verbalized understanding. Lab appointment scheduled. Copied from [place] [id number]. Topic: General - Inquiry >> May 22, 2019 10:49 AM Mathis Bud wrote: Reason for CRM: patient is requesting a call back regarding his lab results.  Patient states nurse can leave message  Call back [phone]

## 2019-05-31 ENCOUNTER — Other Ambulatory Visit: Payer: Self-pay | Admitting: Family Medicine

## 2019-06-03 ENCOUNTER — Other Ambulatory Visit: Payer: Self-pay | Admitting: Family Medicine

## 2019-06-03 DIAGNOSIS — Z7901 Long term (current) use of anticoagulants: Secondary | ICD-10-CM

## 2019-06-10 ENCOUNTER — Other Ambulatory Visit: Payer: Self-pay

## 2019-06-11 ENCOUNTER — Encounter: Payer: Self-pay | Admitting: Family Medicine

## 2019-06-11 ENCOUNTER — Other Ambulatory Visit: Payer: Self-pay

## 2019-06-11 ENCOUNTER — Ambulatory Visit (INDEPENDENT_AMBULATORY_CARE_PROVIDER_SITE_OTHER): Payer: Medicare Other | Admitting: Family Medicine

## 2019-06-11 VITALS — BP 130/78 | HR 70 | Temp 96.2°F | Ht 73.0 in | Wt 251.1 lb

## 2019-06-11 DIAGNOSIS — R7401 Elevation of levels of liver transaminase levels: Secondary | ICD-10-CM | POA: Diagnosis not present

## 2019-06-11 DIAGNOSIS — S39011A Strain of muscle, fascia and tendon of abdomen, initial encounter: Secondary | ICD-10-CM

## 2019-06-11 DIAGNOSIS — S39013A Strain of muscle, fascia and tendon of pelvis, initial encounter: Secondary | ICD-10-CM | POA: Diagnosis not present

## 2019-06-11 NOTE — Patient Instructions (Signed)
Suspect muscle strain  Follow up for any lower extremity swelling or any progressive pain or other problems  Make sure you follow up for repeat liver panel.

## 2019-06-11 NOTE — Progress Notes (Signed)
Subjective:     Patient ID: Samuel [name], male   DOB: 22-Nov-1950, 68 y.o.   MRN: [id number]  HPI   Samuel [name] is seen with new complaint of right inner groin pain.  Started about 3 or 4 days ago when he first was getting out of a chair.  He did not recall any injury at the time.  He has some soreness in the groin region but has not noted any bruising or visible swelling.  His initial concern was possible blood clot issues.  He does take Coumadin because of previous aortic valve replacement and had recent INR 2.2 and is compliant with therapy.  He has not noted any lower extremity edema.  His pain is worse with walking and stretching the muscles of the groin region.  He has not noted any inguinal hernia or any adenopathy.  Pain is relatively mild.  No weakness or numbness.  He had labs back in the Fall with elevated liver transaminases.  These have been repeated once and have been improving.  We had him abstain from alcohol as he has had some binging recently and things are improving there.  No known history of fatty liver changes.  No statin use.  No new medications.  Past Medical History:  Diagnosis Date  . Aortic aneurysm ([place])   . 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 Dyslipidemia   . Gout   . Hemorrhoids   . HEMORRHOIDS-INTERNAL 12/20/2009  . History of diverticulitis 03/2018  . HYPERLIPIDEMIA-MIXED 04/01/2009  . HYPOTHYROIDISM 08/26/2008  . INSOMNIA, TRANSIENT 04/27/2009  . Kidney stones   . KNEE PAIN 11/02/2008  . LATERAL EPICONDYLITIS 06/02/2009  . Migraines   . ONYCHOMYCOSIS 11/02/2008  . PERSONAL HX COLONIC POLYPS 12/20/2009  . Sudden visual loss 12/25/2008  . TRANSIENT ISCHEMIC ATTACK 01/25/2009   Past Surgical History:  Procedure Laterality Date  . CYSTECTOMY     from abdominal wall  . MYRINGOTOMY WITH TUBE PLACEMENT Left   . 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 TONSILLECTOMY      reports that he has quit smoking. He has never used smokeless tobacco. He reports  current alcohol use of about 14.0 standard drinks of alcohol per week. He reports that he does not use drugs. family history includes Diabetes in his mother; Heart disease in his father. Allergies  Allergen Reactions  . Shellfish Allergy Anaphylaxis  . Penicillins     REACTION: childhood, reaction unknown     Review of Systems  Constitutional: Negative for chills and fever.  Respiratory: Negative for cough and shortness of breath.   Cardiovascular: Negative for chest pain and leg swelling.  Gastrointestinal: Negative for abdominal pain, nausea and vomiting.  Neurological: Negative for weakness and numbness.  Hematological: Negative for adenopathy. Does not bruise/bleed easily.       Objective:   Physical Exam Vitals reviewed.  Constitutional:      Appearance: Normal appearance.  Cardiovascular:     Rate and Rhythm: Normal rate and regular rhythm.  Pulmonary:     Effort: Pulmonary effort is normal.     Breath sounds: Normal breath sounds.  Musculoskeletal:     Right lower leg: No edema.     Left lower leg: No edema.     Comments: Right hip reveals excellent range of motion.  Right groin is examined and there is no visible bruising or swelling.  He has some mild tenderness to palpation of the gracilis muscle region.  He has  mild pain with adduction of the hip against resistance and also with hip flexion against resistance.  Neurological:     Mental Status: He is alert.     Comments: No strength deficits lower extremities and symmetric reflexes        Assessment:     #1 right groin pain.  Suspect muscular strain.  Doubt hip pathology based on exam.  No evidence clinically to suggest DVT and we explained he would be very low risk with chronic Coumadin therapy and recent therapeutic INR  #2 mild liver transaminase elevations.  Possibly related to alcohol use.  Improving    Plan:     -Reassurance regarding right groin pain.  Watch for any swelling or any progressive pain or  any new symptoms such as weakness  -Patient will return around early February for repeat hepatic panel and continue to abstain from alcohol in the meantime  Kristian Covey MD [facility] at [facility]

## 2019-06-17 DIAGNOSIS — M2550 Pain in unspecified joint: Secondary | ICD-10-CM | POA: Diagnosis not present

## 2019-06-19 DIAGNOSIS — M79676 Pain in unspecified toe(s): Secondary | ICD-10-CM | POA: Diagnosis not present

## 2019-06-19 DIAGNOSIS — B351 Tinea unguium: Secondary | ICD-10-CM | POA: Diagnosis not present

## 2019-06-24 ENCOUNTER — Telehealth: Payer: Self-pay | Admitting: Family Medicine

## 2019-06-24 NOTE — Telephone Encounter (Signed)
Copied from CRM [id number]. Topic: General - Other >> Jun 24, 2019  1:25 PM Tamela Oddi wrote: Reason for CRM: Patient would like an appt. As soon as possible for his leg pain that he saw him for before.  Tried the office, but no answer.  CB# [phone]

## 2019-06-24 NOTE — Telephone Encounter (Signed)
Patient scheduled.

## 2019-06-25 ENCOUNTER — Ambulatory Visit: Payer: Medicare Other | Admitting: Family Medicine

## 2019-06-27 ENCOUNTER — Ambulatory Visit: Payer: Medicare Other | Admitting: Family Medicine

## 2019-07-02 ENCOUNTER — Ambulatory Visit (INDEPENDENT_AMBULATORY_CARE_PROVIDER_SITE_OTHER): Payer: Medicare Other | Admitting: General Practice

## 2019-07-02 ENCOUNTER — Other Ambulatory Visit: Payer: Self-pay

## 2019-07-02 DIAGNOSIS — Z7901 Long term (current) use of anticoagulants: Secondary | ICD-10-CM

## 2019-07-02 LAB — POCT INR: INR: 3.7 — AB (ref 2.0–3.0)

## 2019-07-02 NOTE — Patient Instructions (Addendum)
Pre visit review using our clinic review tool, if applicable. No additional management support is needed unless otherwise documented below in the visit note.  Skip coumadin today and then continue to take 7.5 mg all days except 10 mg on Sun Tues and Thursday.  Re-check in 4 weeks

## 2019-07-07 ENCOUNTER — Telehealth: Payer: Self-pay | Admitting: General Practice

## 2019-07-07 ENCOUNTER — Other Ambulatory Visit: Payer: Self-pay | Admitting: General Practice

## 2019-07-07 DIAGNOSIS — Z7901 Long term (current) use of anticoagulants: Secondary | ICD-10-CM

## 2019-07-07 MED ORDER — ENOXAPARIN SODIUM 120 MG/0.8ML ~~LOC~~ SOLN: 120.0000 mg | Freq: Two times a day (BID) | SUBCUTANEOUS | 0 refills | Status: DC

## 2019-07-07 NOTE — Telephone Encounter (Signed)
Instructions for coumadin and Lovenox bridge pre and post procedure.  1/28 - Last dose of coumadin until after procedure 1/29 - Nothing (No coumadin and No Lovenox) 1/30 - Lovenox in the AM and PM (12 hours apart) 1/31 - Lovenox in the AM and PM 2/1 - Lovenox in the AM only 2/2 - Procedure (No Lovenox today) 2/3 - Lovenox in the AM and PM AND 3 tablets of coumadin (15 mg) 2/4 - Lovenox in the AM and PM AND 3 tablets of coumadin (15 mg) 2/5 - Lovenox in the AM and PM AND 3 tablets of coumadin (15 mg) 2/6 - Lovenox in the AM and PM AND 3 tablets of coumadin (15 mg) 2/7 - Stop Lovenox and resume current dosage of coumadin 2/8 - Check INR at [facility] at 11:45  Instructions given to patient over telephone and pt verbalized back.  Instructions also mailed per patient request.  CrCl - 130  120 mg Lovenox syringes requested from [facility] in [place] (13 syringes).

## 2019-07-07 NOTE — Telephone Encounter (Signed)
-----   Message from Bruce W Burchette, MD sent at 07/04/2019  3:58 PM EST ----- Regarding: RE: Lovenox bridge Sounds good.  Thanks! ----- Message ----- From: Boyd, Cynthia D, RN Sent: 07/04/2019   3:26 PM EST To: Bruce W Burchette, MD Subject: Lovenox bridge                                 Dr. Burchette,   Patient is scheduled for spinal injection on 2/2.  He will need to stop coumadin for 5 days and will need a Lovenox bridge.  I just need your permission to dose this and send to the pharmacy.  Thanks! Cindy Boyd, RN   

## 2019-07-07 NOTE — Telephone Encounter (Signed)
-----   Message from Kristian Covey, MD sent at 07/04/2019  3:58 PM EST ----- Regarding: RE: Lovenox bridge Sounds good.  Thanks! ----- Message ----- From: Garrison Columbus, RN Sent: 07/04/2019   3:26 PM EST To: Kristian Covey, MD Subject: Lovenox bridge                                 Dr. Caryl Never,   Patient is scheduled for spinal injection on 2/2.  He will need to stop coumadin for 5 days and will need a Lovenox bridge.  I just need your permission to dose this and send to the pharmacy.  Thanks! Bailey Mech, RN

## 2019-07-10 ENCOUNTER — Other Ambulatory Visit: Payer: Self-pay | Admitting: *Deleted

## 2019-07-10 ENCOUNTER — Other Ambulatory Visit: Payer: Self-pay | Admitting: General Practice

## 2019-07-10 ENCOUNTER — Telehealth: Payer: Self-pay | Admitting: Family Medicine

## 2019-07-10 DIAGNOSIS — Z7901 Long term (current) use of anticoagulants: Secondary | ICD-10-CM

## 2019-07-10 MED ORDER — ENOXAPARIN SODIUM 120 MG/0.8ML ~~LOC~~ SOLN: 120.0000 mg | Freq: Two times a day (BID) | SUBCUTANEOUS | 0 refills | Status: DC

## 2019-07-10 NOTE — Telephone Encounter (Signed)
Pt is requesting a call back  From cindy  Instructions for coumadin and Lovenox bridge pre and post procedure. And a medication refill [phone]

## 2019-07-10 NOTE — Telephone Encounter (Signed)
Spoke with patient. Clinic RN reordered rx and sent to [facility] who has 7 out of 13 syringes for patient. Patient also asked for a copy of instructions that Arline Asp gave him that was mailed but he has not received them yet. Arline Asp note was printed off and placed at front office for patient to pick up.

## 2019-07-10 NOTE — Telephone Encounter (Signed)
Pt states his regular pharmacy is unable to fill his medication Lovenox. He is needing Aram Beecham to refill his medication at a different location. Pharmacy [facility] [address], [place], [place] [address])   Sherron Monday to [place] and she picked up the line

## 2019-07-15 DIAGNOSIS — M47896 Other spondylosis, lumbar region: Secondary | ICD-10-CM | POA: Diagnosis not present

## 2019-07-15 DIAGNOSIS — Z79899 Other long term (current) drug therapy: Secondary | ICD-10-CM | POA: Diagnosis not present

## 2019-07-15 DIAGNOSIS — Z5181 Encounter for therapeutic drug level monitoring: Secondary | ICD-10-CM | POA: Diagnosis not present

## 2019-07-16 ENCOUNTER — Other Ambulatory Visit: Payer: Medicare Other

## 2019-07-18 ENCOUNTER — Other Ambulatory Visit: Payer: Self-pay

## 2019-07-21 ENCOUNTER — Ambulatory Visit (INDEPENDENT_AMBULATORY_CARE_PROVIDER_SITE_OTHER): Payer: Medicare Other | Admitting: General Practice

## 2019-07-21 ENCOUNTER — Ambulatory Visit (INDEPENDENT_AMBULATORY_CARE_PROVIDER_SITE_OTHER): Payer: Medicare Other | Admitting: Family Medicine

## 2019-07-21 ENCOUNTER — Other Ambulatory Visit: Payer: Self-pay

## 2019-07-21 ENCOUNTER — Encounter: Payer: Self-pay | Admitting: Family Medicine

## 2019-07-21 ENCOUNTER — Other Ambulatory Visit: Payer: Self-pay | Admitting: Family Medicine

## 2019-07-21 VITALS — BP 114/70 | HR 67 | Temp 97.2°F | Ht 73.0 in | Wt 253.4 lb

## 2019-07-21 DIAGNOSIS — R1084 Generalized abdominal pain: Secondary | ICD-10-CM | POA: Diagnosis not present

## 2019-07-21 DIAGNOSIS — Z7901 Long term (current) use of anticoagulants: Secondary | ICD-10-CM

## 2019-07-21 DIAGNOSIS — R233 Spontaneous ecchymoses: Secondary | ICD-10-CM

## 2019-07-21 DIAGNOSIS — R238 Other skin changes: Secondary | ICD-10-CM | POA: Diagnosis not present

## 2019-07-21 LAB — CBC WITH DIFFERENTIAL/PLATELET
Basophils Absolute: 0 10*3/uL (ref 0.0–0.1)
Basophils Relative: 0.4 % (ref 0.0–3.0)
Eosinophils Absolute: 0 10*3/uL (ref 0.0–0.7)
Eosinophils Relative: 0.5 % (ref 0.0–5.0)
HCT: 46.3 % (ref 39.0–52.0)
Hemoglobin: 16 g/dL (ref 13.0–17.0)
Lymphocytes Relative: 16.9 % (ref 12.0–46.0)
Lymphs Abs: 1.3 10*3/uL (ref 0.7–4.0)
MCHC: 34.5 g/dL (ref 30.0–36.0)
MCV: 92.5 fl (ref 78.0–100.0)
Monocytes Absolute: 1 10*3/uL (ref 0.1–1.0)
Monocytes Relative: 12.7 % — ABNORMAL HIGH (ref 3.0–12.0)
Neutro Abs: 5.4 10*3/uL (ref 1.4–7.7)
Neutrophils Relative %: 69.5 % (ref 43.0–77.0)
Platelets: 158 10*3/uL (ref 150.0–400.0)
RBC: 5.01 Mil/uL (ref 4.22–5.81)
RDW: 13.7 % (ref 11.5–15.5)
WBC: 7.8 10*3/uL (ref 4.0–10.5)

## 2019-07-21 LAB — POCT INR: INR: 1.8 — AB (ref 2.0–3.0)

## 2019-07-21 NOTE — Patient Instructions (Signed)
Abdominal Pain, Adult Pain in the abdomen (abdominal pain) can be caused by many things. Often, abdominal pain is not serious and it gets better with no treatment or by being treated at home. However, sometimes abdominal pain is serious. Your health care provider will ask questions about your medical history and do a physical exam to try to determine the cause of your abdominal pain. Follow these instructions at home:  Medicines  Take over-the-counter and prescription medicines only as told by your health care provider.  Do not take a laxative unless told by your health care provider. General instructions  Watch your condition for any changes.  Drink enough fluid to keep your urine pale yellow.  Keep all follow-up visits as told by your health care provider. This is important. Contact a health care provider if:  Your abdominal pain changes or gets worse.  You are not hungry or you lose weight without trying.  You are constipated or have diarrhea for more than 2-3 days.  You have pain when you urinate or have a bowel movement.  Your abdominal pain wakes you up at night.  Your pain gets worse with meals, after eating, or with certain foods.  You are vomiting and cannot keep anything down.  You have a fever.  You have blood in your urine. Get help right away if:  Your pain does not go away as soon as your health care provider told you to expect.  You cannot stop vomiting.  Your pain is only in areas of the abdomen, such as the right side or the left lower portion of the abdomen. Pain on the right side could be caused by appendicitis.  You have bloody or black stools, or stools that look like tar.  You have severe pain, cramping, or bloating in your abdomen.  You have signs of dehydration, such as: ? Dark urine, very little urine, or no urine. ? Cracked lips. ? Dry mouth. ? Sunken eyes. ? Sleepiness. ? Weakness.  You have trouble breathing or chest  pain. Summary  Often, abdominal pain is not serious and it gets better with no treatment or by being treated at home. However, sometimes abdominal pain is serious.  Watch your condition for any changes.  Take over-the-counter and prescription medicines only as told by your health care provider.  Contact a health care provider if your abdominal pain changes or gets worse.  Get help right away if you have severe pain, cramping, or bloating in your abdomen. This information is not intended to replace advice given to you by your health care provider. Make sure you discuss any questions you have with your health care provider. Document Revised: 10/07/2018 Document Reviewed: 10/07/2018 Elsevier Patient Education  2020 Elsevier Inc.  

## 2019-07-21 NOTE — Progress Notes (Signed)
Subjective:     Patient ID: Samuel [name], male   DOB: Sep 30, 1950, 69 y.o.   MRN: [id number]  HPI   Samuel [name] is seen with acute onset of abdominal pain somewhat bilateral and lower abdominal region yesterday.  He noted this when he first got up early in the morning.  His pain symptoms were constant and about 2 hours ago.  He has not had a recent constipation.  Denies any flank pain or dysuria.  He points to lower abdominal region bilaterally.  He initially thought this may be a "diverticulitis "flare.  Denies any nausea or vomiting.  No fever.  He states he has somewhat loose stools at baseline has not had any recent constipation.  He has absolutely no abdominal pain whatsoever at this time  Other issue is he is on chronic Coumadin with prior history of aortic valve replacement.  He had some ongoing cervical neck pains and recently had been switched to Lovenox for cervical neck injection.  There was a scheduling conflict and now he has to go back on Lovenox again.  He has some bruising lower abdominal region from the Lovenox.  Past Medical History:  Diagnosis Date  . Aortic aneurysm (HCC)   . 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 Dyslipidemia   . Gout   . Hemorrhoids   . HEMORRHOIDS-INTERNAL 12/20/2009  . History of diverticulitis 03/2018  . HYPERLIPIDEMIA-MIXED 04/01/2009  . HYPOTHYROIDISM 08/26/2008  . INSOMNIA, TRANSIENT 04/27/2009  . Kidney stones   . KNEE PAIN 11/02/2008  . LATERAL EPICONDYLITIS 06/02/2009  . Migraines   . ONYCHOMYCOSIS 11/02/2008  . PERSONAL HX COLONIC POLYPS 12/20/2009  . Sudden visual loss 12/25/2008  . TRANSIENT ISCHEMIC ATTACK 01/25/2009   Past Surgical History:  Procedure Laterality Date  . CYSTECTOMY     from abdominal wall  . MYRINGOTOMY WITH TUBE PLACEMENT Left   . 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 TONSILLECTOMY      reports that he has quit smoking. He has never used smokeless tobacco. He reports current alcohol use of about 14.0 standard  drinks of alcohol per week. He reports that he does not use drugs. family history includes Diabetes in his mother; Heart disease in his father. Allergies  Allergen Reactions  . Shellfish Allergy Anaphylaxis  . Penicillins     REACTION: childhood, reaction unknown     Review of Systems  Constitutional: Negative for chills and fever.  Respiratory: Negative for cough and shortness of breath.   Cardiovascular: Negative for chest pain.  Gastrointestinal: Positive for abdominal pain. Negative for abdominal distention, blood in stool, constipation, nausea and vomiting.  Genitourinary: Negative for dysuria.  Neurological: Negative for dizziness.       Objective:   Physical Exam Vitals reviewed.  Constitutional:      Appearance: Normal appearance.  Cardiovascular:     Rate and Rhythm: Regular rhythm.  Pulmonary:     Effort: Pulmonary effort is normal.     Breath sounds: Normal breath sounds.  Abdominal:     General: Bowel sounds are normal.     Palpations: Abdomen is soft. There is no mass.     Tenderness: There is no abdominal tenderness. There is no guarding or rebound.     Comments: He has some bruising lower abdominal wall right and left side in the region where he had been giving Lovenox injections.  No significant hematoma  Neurological:     Mental Status: He is alert.  Assessment:     #1 transient lower abdominal pain.  Resolved at this time.  Doubt diverticulitis.  No evidence for acute abdomen.  No recent constipation symptoms.  Does not have any red flags such as fever, vomiting, stool change  #2 chronic Coumadin therapy.  Lower abdominal wall bruising secondary to Lovenox      Plan:     -Check CBC with differential -Follow-up promptly for any recurrent abdominal pain, fever, or other concerns  Eulas Post MD [facility]

## 2019-07-21 NOTE — Telephone Encounter (Signed)
Patient has a visit today. He will explain.

## 2019-07-21 NOTE — Patient Instructions (Addendum)
Pre visit review using our clinic review tool, if applicable. No additional management support is needed unless otherwise documented below in the visit note.  Take 2 tablets today (2/8) and then continue to take 7.5 mg all days except 10 mg on Sun Tues and Thursday. Please follow patient instructions.  Re-check on 2/24.   2/13 - Last dose of coumadin until after procedure 2/14 - Nothing (No coumadin and No Lovenox) 2/15 - Lovenox in the AM and PM 2/16 - Lovenox in the AM and PM 2/17 - Lovenox in the AM only 2/18 - Procedure (No Lovenox today) 2/19 - Lovenox in the AM and PM and 3 tablets of coumadin 2/20 - Lovenox in the AM and PM and 3 tablets of coumadin 2/21 - Lovenox in the AM and PM and 3 tablets of coumadin 2/22 - Lovenox in the AM and PM and 3 tablets of coumadin 2/23 - Stop Lovenox and resume current dosage of coumadin 2/24 - Re-check INR

## 2019-07-23 ENCOUNTER — Ambulatory Visit: Payer: Medicare Other

## 2019-07-24 DIAGNOSIS — Z23 Encounter for immunization: Secondary | ICD-10-CM | POA: Diagnosis not present

## 2019-07-30 ENCOUNTER — Ambulatory Visit: Payer: Medicare Other

## 2019-07-30 ENCOUNTER — Telehealth: Payer: Self-pay

## 2019-07-30 ENCOUNTER — Telehealth: Payer: Self-pay | Admitting: General Practice

## 2019-07-30 ENCOUNTER — Telehealth: Payer: Self-pay | Admitting: Family Medicine

## 2019-07-30 ENCOUNTER — Other Ambulatory Visit: Payer: Self-pay | Admitting: General Practice

## 2019-07-30 DIAGNOSIS — Z7901 Long term (current) use of anticoagulants: Secondary | ICD-10-CM

## 2019-07-30 MED ORDER — ENOXAPARIN SODIUM 120 MG/0.8ML ~~LOC~~ SOLN: 120.0000 mg | Freq: Two times a day (BID) | SUBCUTANEOUS | 0 refills | Status: DC

## 2019-07-30 NOTE — Telephone Encounter (Signed)
Pt received a call from [facility] needing to r/s his appt b/c they decided to close due to weather. Pt is now needing to know what to do with his medication now that his appt have been pushed to 08/12/19   Pt can be reached at [phone]

## 2019-07-30 NOTE — Telephone Encounter (Signed)
Called and spoke w/pt to let them know that we are closed tomorrow and to call dr Ethelene Hal for further instruction. Pt voiced understanding

## 2019-07-30 NOTE — Telephone Encounter (Signed)
Patient has been given instructions regarding coumadin and Lovenox.  Pt did verbalize understanding.

## 2019-07-30 NOTE — Telephone Encounter (Signed)
Updated instructions for coumadin and Lovenox pre and post procedure on 3/2,  2/25 - Last dose of coumadin until after procedure 2/26 - Nothing 2/27 - Lovenox in the AM and PM 2/28 - Lovenox in the AM and PM 3/1 - Lovenox in the AM only 3/2 - Procedure (No Lovenox today) 3/3 - Lovenox in the AM and PM AND 3 tablets of coumadin 3/4 - Lovenox in the AM and PM AND 3 tablets of coumadin 3/5 - Lovenox in the AM and PM AND 3 tablets of coumadin 3/6 - Lovenox in the AM and PM AND 3 tablets of coumadin 3/7 - Resume current dosage of coumadin and stop Lovenox 3/8 - Re-check INR

## 2019-08-05 ENCOUNTER — Other Ambulatory Visit: Payer: Self-pay

## 2019-08-06 ENCOUNTER — Ambulatory Visit (INDEPENDENT_AMBULATORY_CARE_PROVIDER_SITE_OTHER): Payer: Medicare Other | Admitting: General Practice

## 2019-08-06 DIAGNOSIS — Z7901 Long term (current) use of anticoagulants: Secondary | ICD-10-CM | POA: Diagnosis not present

## 2019-08-06 LAB — POCT INR: INR: 1.8 — AB (ref 2.0–3.0)

## 2019-08-06 NOTE — Patient Instructions (Addendum)
Pre visit review using our clinic review tool, if applicable. No additional management support is needed unless otherwise documented below in the visit note  Continue to take 7.5 mg all days except 10 mg on Sun Tues and Thursday. Please follow patient instructions.  Re-check on 3/8.Marland Kitchen  Updated instructions for coumadin and Lovenox pre and post procedure on 3/2,  2/25 - Last dose of coumadin until after procedure 2/26 - Nothing 2/27 - Lovenox in the AM and PM 2/28 - Lovenox in the AM and PM 3/1 - Lovenox in the AM only 3/2 - Procedure (No Lovenox today) 3/3 - Lovenox in the AM and PM AND 3 tablets of coumadin 3/4 - Lovenox in the AM and PM AND 3 tablets of coumadin 3/5 - Lovenox in the AM and PM AND 3 tablets of coumadin 3/6 - Lovenox in the AM and PM AND 3 tablets of coumadin 3/7 - Resume current dosage of coumadin and stop Lovenox 3/8 - Re-check INR

## 2019-08-12 ENCOUNTER — Ambulatory Visit (INDEPENDENT_AMBULATORY_CARE_PROVIDER_SITE_OTHER): Payer: Medicare Other | Admitting: Pharmacist Clinician (PhC)/ Clinical Pharmacy Specialist

## 2019-08-12 ENCOUNTER — Other Ambulatory Visit: Payer: Self-pay

## 2019-08-12 DIAGNOSIS — Z952 Presence of prosthetic heart valve: Secondary | ICD-10-CM | POA: Diagnosis not present

## 2019-08-12 DIAGNOSIS — Z7901 Long term (current) use of anticoagulants: Secondary | ICD-10-CM | POA: Diagnosis not present

## 2019-08-12 DIAGNOSIS — M5030 Other cervical disc degeneration, unspecified cervical region: Secondary | ICD-10-CM | POA: Diagnosis not present

## 2019-08-12 DIAGNOSIS — M5136 Other intervertebral disc degeneration, lumbar region: Secondary | ICD-10-CM | POA: Diagnosis not present

## 2019-08-12 LAB — POCT INR: INR: 1.1 — AB (ref 2.0–3.0)

## 2019-08-13 DIAGNOSIS — Z03818 Encounter for observation for suspected exposure to other biological agents ruled out: Secondary | ICD-10-CM | POA: Diagnosis not present

## 2019-08-13 DIAGNOSIS — Z20828 Contact with and (suspected) exposure to other viral communicable diseases: Secondary | ICD-10-CM | POA: Diagnosis not present

## 2019-08-15 ENCOUNTER — Other Ambulatory Visit: Payer: Self-pay

## 2019-08-18 ENCOUNTER — Ambulatory Visit: Payer: Medicare Other | Admitting: General Practice

## 2019-08-20 ENCOUNTER — Ambulatory Visit: Payer: Medicare Other

## 2019-08-21 DIAGNOSIS — Z23 Encounter for immunization: Secondary | ICD-10-CM | POA: Diagnosis not present

## 2019-08-24 ENCOUNTER — Other Ambulatory Visit: Payer: Self-pay | Admitting: Family Medicine

## 2019-08-27 DIAGNOSIS — M5030 Other cervical disc degeneration, unspecified cervical region: Secondary | ICD-10-CM | POA: Diagnosis not present

## 2019-08-27 DIAGNOSIS — Z79891 Long term (current) use of opiate analgesic: Secondary | ICD-10-CM | POA: Diagnosis not present

## 2019-08-27 DIAGNOSIS — M5136 Other intervertebral disc degeneration, lumbar region: Secondary | ICD-10-CM | POA: Diagnosis not present

## 2019-08-27 DIAGNOSIS — M47812 Spondylosis without myelopathy or radiculopathy, cervical region: Secondary | ICD-10-CM | POA: Diagnosis not present

## 2019-09-09 DIAGNOSIS — M47812 Spondylosis without myelopathy or radiculopathy, cervical region: Secondary | ICD-10-CM | POA: Diagnosis not present

## 2019-09-09 DIAGNOSIS — B351 Tinea unguium: Secondary | ICD-10-CM | POA: Diagnosis not present

## 2019-09-09 DIAGNOSIS — M79676 Pain in unspecified toe(s): Secondary | ICD-10-CM | POA: Diagnosis not present

## 2019-10-05 ENCOUNTER — Other Ambulatory Visit: Payer: Self-pay | Admitting: Family Medicine

## 2019-10-05 DIAGNOSIS — Z7901 Long term (current) use of anticoagulants: Secondary | ICD-10-CM

## 2019-10-06 NOTE — Telephone Encounter (Signed)
Please advise 

## 2019-10-13 DIAGNOSIS — M5412 Radiculopathy, cervical region: Secondary | ICD-10-CM | POA: Diagnosis not present

## 2019-10-13 DIAGNOSIS — M542 Cervicalgia: Secondary | ICD-10-CM | POA: Diagnosis not present

## 2019-10-16 ENCOUNTER — Other Ambulatory Visit: Payer: Self-pay | Admitting: Orthopedic Surgery

## 2019-10-16 DIAGNOSIS — M5412 Radiculopathy, cervical region: Secondary | ICD-10-CM

## 2019-11-01 ENCOUNTER — Ambulatory Visit
Admission: RE | Admit: 2019-11-01 | Discharge: 2019-11-01 | Disposition: A | Discharge status: Home / Self Care | Payer: Medicare Other | Source: Ambulatory Visit | Attending: Orthopedic Surgery | Admitting: Orthopedic Surgery

## 2019-11-01 DIAGNOSIS — M4802 Spinal stenosis, cervical region: Secondary | ICD-10-CM | POA: Diagnosis not present

## 2019-11-01 DIAGNOSIS — M5412 Radiculopathy, cervical region: Secondary | ICD-10-CM

## 2019-11-03 ENCOUNTER — Ambulatory Visit (INDEPENDENT_AMBULATORY_CARE_PROVIDER_SITE_OTHER): Payer: Medicare Other | Admitting: General Practice

## 2019-11-03 ENCOUNTER — Encounter: Payer: Self-pay | Admitting: Family Medicine

## 2019-11-03 ENCOUNTER — Ambulatory Visit (INDEPENDENT_AMBULATORY_CARE_PROVIDER_SITE_OTHER): Payer: Medicare Other | Admitting: Family Medicine

## 2019-11-03 ENCOUNTER — Other Ambulatory Visit: Payer: Self-pay

## 2019-11-03 VITALS — BP 132/76 | HR 75 | Temp 98.0°F | Wt 256.9 lb

## 2019-11-03 DIAGNOSIS — R635 Abnormal weight gain: Secondary | ICD-10-CM | POA: Diagnosis not present

## 2019-11-03 DIAGNOSIS — E039 Hypothyroidism, unspecified: Secondary | ICD-10-CM | POA: Diagnosis not present

## 2019-11-03 DIAGNOSIS — I872 Venous insufficiency (chronic) (peripheral): Secondary | ICD-10-CM

## 2019-11-03 DIAGNOSIS — Z7901 Long term (current) use of anticoagulants: Secondary | ICD-10-CM

## 2019-11-03 DIAGNOSIS — R600 Localized edema: Secondary | ICD-10-CM

## 2019-11-03 LAB — POCT INR: INR: 2.6 (ref 2.0–3.0)

## 2019-11-03 MED ORDER — TRIAMCINOLONE ACETONIDE 0.1 % EX CREA
1.0000 | TOPICAL_CREAM | Freq: Two times a day (BID) | CUTANEOUS | 1 refills | Status: DC
Start: 2019-11-03 — End: 2019-12-08

## 2019-11-03 NOTE — Patient Instructions (Addendum)
Pre visit review using our clinic review tool, if applicable. No additional management support is needed unless otherwise documented below in the visit note.  Continue 7.5 mg daily except 10 mg on sun tues and Thursday.  Re-check in 6 weeks.

## 2019-11-03 NOTE — Patient Instructions (Signed)

## 2019-11-03 NOTE — Progress Notes (Signed)
Subjective:     Patient ID: Samuel [name], male   DOB: 09/13/1950, 69 y.o.   MRN: [id number]  HPI  He has history of aortic valve replacement, hypothyroidism, gout, hyperlipidemia, chronic anticoagulation with Coumadin.  Seen today for the following issues  He relates some "spots "on his leg especially left leg.  He has noted some erythema.  Occasional mild itching.  He has had some slight increased bilateral leg edema which is worse late in the day.  Denies any changes soaps or detergents.  No orthopnea.  Has not tried anything on the rash itself.  Bilateral upper extremity tremor.  Drinks about 2 cups of coffee per day.  Tremors are intention tremor and worse with purposeful activity.  He has not noted any other neurologic symptoms.  He is not aware of family history of essential tremor.  He has not paid attention whether this changes with alcohol consumption.  He had some weight gain years past year which she attributes to the pandemic and less activity.  He is not been coughing much.  His thyroid was screened back in the fall and normal  He has had ongoing cervical neck pains which have limited his golf and other activities.  He is followed by orthopedics.  He had MRI last week and has not been called with results and had requested that we review those and go over with him.  This shows multilevel degenerative disc disease with several areas of possible nerve compression.  He has follow-up scheduled with orthopedics  Past Medical History:  Diagnosis Date  . Aortic aneurysm (HCC)   . 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 Dyslipidemia   . Gout   . Hemorrhoids   . HEMORRHOIDS-INTERNAL 12/20/2009  . History of diverticulitis 03/2018  . HYPERLIPIDEMIA-MIXED 04/01/2009  . HYPOTHYROIDISM 08/26/2008  . INSOMNIA, TRANSIENT 04/27/2009  . Kidney stones   . KNEE PAIN 11/02/2008  . LATERAL EPICONDYLITIS 06/02/2009  . Migraines   . ONYCHOMYCOSIS 11/02/2008  . PERSONAL HX COLONIC POLYPS 12/20/2009  .  Sudden visual loss 12/25/2008  . TRANSIENT ISCHEMIC ATTACK 01/25/2009   Past Surgical History:  Procedure Laterality Date  . CYSTECTOMY     from abdominal wall  . MYRINGOTOMY WITH TUBE PLACEMENT Left   . 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 TONSILLECTOMY      reports that he has quit smoking. He has never used smokeless tobacco. He reports current alcohol use of about 14.0 standard drinks of alcohol per week. He reports that he does not use drugs. family history includes Diabetes in his mother; Heart disease in his father. Allergies  Allergen Reactions  . Shellfish Allergy Anaphylaxis  . Penicillins     REACTION: childhood, reaction unknown   Wt Readings from Last 3 Encounters:  11/03/19 256 lb 14.4 oz (116.5 kg)  07/21/19 253 lb 6.4 oz (114.9 kg)  06/11/19 251 lb 1.6 oz (113.9 kg)     Review of Systems  Constitutional: Negative for chills, fatigue, fever and unexpected weight change.  Eyes: Negative for visual disturbance.  Respiratory: Negative for cough, chest tightness and shortness of breath.   Cardiovascular: Positive for leg swelling. Negative for chest pain and palpitations.  Gastrointestinal: Negative for abdominal pain.  Neurological: Positive for tremors. Negative for dizziness, seizures, syncope, speech difficulty, weakness, light-headedness, numbness and headaches.       Objective:   Physical Exam Vitals reviewed.  Constitutional:      Appearance: Normal appearance.  Cardiovascular:     Rate and Rhythm: Normal rate and regular rhythm.  Musculoskeletal:     Cervical back: Normal range of motion and neck supple.     Comments: He does have trace to 1+ pitting edema lower legs bilaterally  Skin:    Findings: Rash present.     Comments: He has erythematous rash lower legs left greater than right.  Slightly scaly and slightly raised.  Nontender.  Neurological:     General: No focal deficit present.     Mental Status: He is alert.     Cranial  Nerves: No cranial nerve deficit.     Comments: Very subtle tremor of the hands.  No cogwheel rigidity.  Gait normal.        Assessment:     #1 bilateral leg rash.  Suspect he may have some component of venous stasis dermatitis.  He does have some increased bilateral leg edema today  #2 recent weight gain related inactivity  #3 bilateral upper extremity tremor.  Suspect essential tremor.  Symptoms relatively mild at this time.  #4 cervical neck pain.  Reviewed results from recent MRI with patient.  He will discuss with orthopedist later this week  #5 hypothyroidism.  Most recent TSH results reviewed from last October and these were stable then      Plan:     -Triamcinolone 0.1% cream to use twice daily as needed to skin rash  -Elevate legs frequently.  We discussed possible use of low-dose furosemide but this point he wishes to wait  -We discussed possible neurology referral regarding his tremor at this point symptoms are relatively mild and he declines referral  -We advise he try to scale back calories and increase activities much as possible to lose some weight  Eulas Post MD [facility]

## 2019-11-06 ENCOUNTER — Other Ambulatory Visit: Payer: Self-pay | Admitting: Family Medicine

## 2019-11-06 DIAGNOSIS — Z7901 Long term (current) use of anticoagulants: Secondary | ICD-10-CM

## 2019-11-11 ENCOUNTER — Other Ambulatory Visit: Payer: Self-pay | Admitting: Family Medicine

## 2019-11-14 DIAGNOSIS — M5412 Radiculopathy, cervical region: Secondary | ICD-10-CM | POA: Diagnosis not present

## 2019-11-20 ENCOUNTER — Other Ambulatory Visit: Payer: Self-pay | Admitting: Orthopedic Surgery

## 2019-11-20 DIAGNOSIS — M25512 Pain in left shoulder: Secondary | ICD-10-CM

## 2019-11-24 DIAGNOSIS — Z79891 Long term (current) use of opiate analgesic: Secondary | ICD-10-CM | POA: Diagnosis not present

## 2019-11-24 DIAGNOSIS — M25512 Pain in left shoulder: Secondary | ICD-10-CM | POA: Diagnosis not present

## 2019-12-08 ENCOUNTER — Encounter: Payer: Self-pay | Admitting: Family Medicine

## 2019-12-08 ENCOUNTER — Ambulatory Visit (INDEPENDENT_AMBULATORY_CARE_PROVIDER_SITE_OTHER): Payer: Medicare Other | Admitting: Family Medicine

## 2019-12-08 ENCOUNTER — Other Ambulatory Visit: Payer: Self-pay

## 2019-12-08 VITALS — BP 122/68 | HR 87 | Temp 97.6°F | Wt 258.5 lb

## 2019-12-08 DIAGNOSIS — R21 Rash and other nonspecific skin eruption: Secondary | ICD-10-CM | POA: Diagnosis not present

## 2019-12-08 NOTE — Progress Notes (Signed)
Established Patient Office Visit  Subjective:  Patient ID: Samuel [name], male    DOB: 04-02-1951  Age: 69 y.o. MRN: [id number]  CC:  Chief Complaint  Patient presents with  . Rash    on left leg has not gotten better and has been there for about 6 weeks     HPI Samuel [name] presents for worsening rash predominately left anterior lower leg.  He was seen back in May with some increased leg edema at that point noticed some mild itching and a few erythematous areas.  At that point rash seems slightly scaly and slightly raised and weaving some considered some venous stasis dermatitis but he did not respond any to triamcinolone.  He has mild involvement of the right leg but predominately left leg.  Rash is nontender and nonpruritic at this time.  No new medications.  Denies any recent fevers or chills.  No easy bruising.  No new arthralgias or myalgias.  Remains on Coumadin.  Most recent INR 2.6  Past Medical History:  Diagnosis Date  . Aortic aneurysm (HCC)   . 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 Dyslipidemia   . Gout   . Hemorrhoids   . HEMORRHOIDS-INTERNAL 12/20/2009  . History of diverticulitis 03/2018  . HYPERLIPIDEMIA-MIXED 04/01/2009  . HYPOTHYROIDISM 08/26/2008  . INSOMNIA, TRANSIENT 04/27/2009  . Kidney stones   . KNEE PAIN 11/02/2008  . LATERAL EPICONDYLITIS 06/02/2009  . Migraines   . ONYCHOMYCOSIS 11/02/2008  . PERSONAL HX COLONIC POLYPS 12/20/2009  . Sudden visual loss 12/25/2008  . TRANSIENT ISCHEMIC ATTACK 01/25/2009    Past Surgical History:  Procedure Laterality Date  . CYSTECTOMY     from abdominal wall  . MYRINGOTOMY WITH TUBE PLACEMENT Left   . 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 TONSILLECTOMY      Family History  Problem Relation Age of Onset  . Diabetes Mother   . Heart disease Father        valve problem    Social History   Socioeconomic History  . Marital status: Married    Spouse name: Not on file  . Number of children: Not on  file  . Years of education: Not on file  . Highest education level: Not on file  Occupational History  . Not on file  Tobacco Use  . Smoking status: Former Games developer  . Smokeless tobacco: Never Used  Vaping Use  . Vaping Use: Never used  Substance and Sexual Activity  . Alcohol use: Yes    Alcohol/week: 14.0 standard drinks    Types: 14 Glasses of wine per week    Comment: couple glasses of wine each day  . Drug use: No  . Sexual activity: Not on file  Other Topics Concern  . Not on file  Social History Narrative  . Not on file   Social Determinants of Health   Financial Resource Strain:   . Difficulty of Paying Living Expenses:   Food Insecurity:   . Worried About Programme researcher, broadcasting/film/video in the Last Year:   . Barista in the Last Year:   Transportation Needs:   . Freight forwarder (Medical):   Marland Kitchen Lack of Transportation (Non-Medical):   Physical Activity:   . Days of Exercise per Week:   . Minutes of Exercise per Session:   Stress:   . Feeling of Stress :   Social Connections:   . Frequency of Communication with Friends  and Family:   . Frequency of Social Gatherings with Friends and Family:   . Attends Religious Services:   . Active Member of Clubs or Organizations:   . Attends Banker Meetings:   Marland Kitchen Marital Status:   Intimate Partner Violence:   . Fear of Current or Ex-Partner:   . Emotionally Abused:   Marland Kitchen Physically Abused:   . Sexually Abused:     Outpatient Medications Prior to Visit  Medication Sig Dispense Refill  . allopurinol (ZYLOPRIM) 300 MG tablet TAKE 1 TABLET BY MOUTH EVERY DAY 90 tablet 3  . ciclopirox (PENLAC) 8 % solution APPLY TO ALL TOENAILS DAILY, ONCE WEEKLY CLEANSE NAILS THOROUGHLY WITH NAIL POLISH REMOVER    . colchicine (COLCRYS) 0.6 MG tablet TAKE 1 TABLET BY MOUTH TWICE DAILY AS NEEDED FOR ACUTE GOUT 180 tablet 1  . enoxaparin (LOVENOX) 120 MG/0.8ML injection Inject 0.8 mLs (120 mg total) into the skin every 12 (twelve)  hours. 10.4 mL 0  . ketoconazole (NIZORAL) 2 % cream     . levothyroxine (SYNTHROID) 150 MCG tablet TAKE 1 TABLET BY MOUTH EVERY DAY 90 tablet 2  . oxyCODONE (OXY IR/ROXICODONE) 5 MG immediate release tablet oxycodone 5 mg tablet  Take 1 tablet twice a day by oral route as needed.    . warfarin (COUMADIN) 5 MG tablet TAKE 1 AND 1/2 TABLETS EVERY DAY EXCEPT 2 TABLETS ON SUNDAY, TUESDAY AND THURSDAY OR AS DIRECTED 150 tablet 0  . triamcinolone cream (KENALOG) 0.1 % Apply 1 application topically 2 (two) times daily. 30 g 1  . clindamycin (CLEOCIN) 150 MG capsule TAKE 4 CAPSULES BY MOUTH 1 HOUR PRIOR TO DENTAL APPOINTMENT (Patient not taking: Reported on 12/08/2019)     No facility-administered medications prior to visit.    Allergies  Allergen Reactions  . Shellfish Allergy Anaphylaxis  . Penicillins     REACTION: childhood, reaction unknown    ROS Review of Systems  Constitutional: Negative for chills and fever.  Respiratory: Negative for cough and shortness of breath.   Cardiovascular: Negative for chest pain.  Musculoskeletal: Negative for myalgias.  Skin: Positive for rash.      Objective:    Physical Exam Constitutional:      Appearance: Normal appearance.  Cardiovascular:     Rate and Rhythm: Normal rate.  Pulmonary:     Effort: Pulmonary effort is normal.     Breath sounds: Normal breath sounds.  Skin:    Comments: As somewhat splotchy erythematous macular slightly blanching rash left anterior lower leg.  Nonscaly at this point.  Nontender.  Neurological:     Mental Status: He is alert.     BP 122/68 (BP Location: Left Arm, Patient Position: Sitting, Cuff Size: Normal)   Pulse 87   Temp 97.6 F (36.4 C) (Temporal)   Wt 258 lb 8 oz (117.3 kg)   SpO2 96%   BMI 34.10 kg/m  Wt Readings from Last 3 Encounters:  12/08/19 258 lb 8 oz (117.3 kg)  11/03/19 256 lb 14.4 oz (116.5 kg)  07/21/19 253 lb 6.4 oz (114.9 kg)     Health Maintenance Due  Topic Date Due    . Hepatitis C Screening  Never done  . PNA vac Low Risk Adult (1 of 2 - PCV13) Never done    There are no preventive care reminders to display for this patient.  Lab Results  Component Value Date   TSH 2.76 03/27/2019   Lab Results  Component Value Date  WBC 7.8 07/21/2019   HGB 16.0 07/21/2019   HCT 46.3 07/21/2019   MCV 92.5 07/21/2019   PLT 158.0 07/21/2019   Lab Results  Component Value Date   NA 136 03/27/2019   K 4.1 03/27/2019   CO2 27 03/27/2019   GLUCOSE 94 03/27/2019   BUN 15 03/27/2019   CREATININE 0.87 03/27/2019   BILITOT 1.1 05/21/2019   ALKPHOS 60 05/21/2019   AST 69 (H) 05/21/2019   ALT 119 (H) 05/21/2019   PROT 6.3 05/21/2019   ALBUMIN 4.3 05/21/2019   CALCIUM 9.1 03/27/2019   GFR 87.11 03/27/2019   Lab Results  Component Value Date   CHOL 160 04/28/2019   Lab Results  Component Value Date   HDL 35 (L) 04/28/2019   Lab Results  Component Value Date   LDLCALC 104 (H) 04/28/2019   Lab Results  Component Value Date   TRIG 115 04/28/2019   Lab Results  Component Value Date   CHOLHDL 4.6 04/28/2019   Lab Results  Component Value Date   HGBA1C  02/23/2009    5.8 (NOTE) The ADA recommends the following therapeutic goal for glycemic control related to Hgb A1c measurement: Goal of therapy: <6.5 Hgb A1c  Reference: American Diabetes Association: Clinical Practice Recommendations 2010, Diabetes Care, 2010, 33: (Suppl  1).      Assessment & Plan:   Skin rash predominately left leg.  Lesser involvement of the right anterior leg.  This does not look eczematous.  Question vasculitic  We recommend consider punch biopsy.  We discussed risk including bleeding, infection, scarring and patient consented.  Skin prepped with Betadine.  Local anesthesia with 1% Xylocaine with epinephrine.  After achieving anesthesia locally we used a number 5 mm punch biopsy and did a through and through biopsy of left lower lateral leg.  Minimal bleeding.  Closed with  2 sutures of 5-0 Ethilon.  Vaseline and dressing applied Wound care instruction given.  Specimen sent to [facility] pathology for further evaluation Consider dermatology consult based on biopsy results above  No orders of the defined types were placed in this encounter.   Follow-up: Return in about 9 days (around 12/17/2019).    Carolann Littler, MD

## 2019-12-08 NOTE — Patient Instructions (Signed)
Keep wound dry for the first 24 hours then clean daily with soap and water for one week. Apply vaseline daily for 3-4 days. Keep covered with clean dressing for 4-5 days. Follow up promptly for any signs of infection such as redness, warmth, pain, or drainage. Return in about 7 to 10 days to get sutures out.

## 2019-12-12 ENCOUNTER — Other Ambulatory Visit: Payer: Self-pay

## 2019-12-12 ENCOUNTER — Other Ambulatory Visit: Payer: Self-pay | Admitting: Family Medicine

## 2019-12-12 MED ORDER — CICLOPIROX 0.77 % EX GEL: 1.0000 "application " | Freq: Two times a day (BID) | CUTANEOUS | 1 refills | Status: DC

## 2019-12-12 NOTE — Telephone Encounter (Signed)
Please advise if cream is okay gel not covered by insurance

## 2019-12-12 NOTE — Telephone Encounter (Signed)
Okay per burchette to change to cream

## 2019-12-17 ENCOUNTER — Ambulatory Visit (INDEPENDENT_AMBULATORY_CARE_PROVIDER_SITE_OTHER): Payer: Medicare Other | Admitting: Family Medicine

## 2019-12-17 ENCOUNTER — Ambulatory Visit (INDEPENDENT_AMBULATORY_CARE_PROVIDER_SITE_OTHER): Payer: Medicare Other | Admitting: General Practice

## 2019-12-17 ENCOUNTER — Other Ambulatory Visit: Payer: Self-pay

## 2019-12-17 ENCOUNTER — Encounter: Payer: Self-pay | Admitting: Family Medicine

## 2019-12-17 VITALS — BP 120/62 | HR 70 | Temp 97.6°F | Wt 252.2 lb

## 2019-12-17 DIAGNOSIS — Z7901 Long term (current) use of anticoagulants: Secondary | ICD-10-CM

## 2019-12-17 DIAGNOSIS — B369 Superficial mycosis, unspecified: Secondary | ICD-10-CM

## 2019-12-17 LAB — POCT INR: INR: 1.4 — AB (ref 2.0–3.0)

## 2019-12-17 NOTE — Progress Notes (Signed)
Established Patient Office Visit  Subjective:  Patient ID: Samuel [name], male    DOB: [birth year]  Age: 69 y.o. MRN: [id number]  CC:  Chief Complaint  Patient presents with  . Suture / Staple Removal    pt is here to get stitches on left leg out     HPI Samuel [name] presents for suture removal and to discuss recent biopsy results from his left leg.  This came back positive for hyphae and dermatophyte infection.  No evidence for vasculitis.  He had some difficulty getting Loprox and cannot get this filled until today at the pharmacy.  He plans to start this tonight.  He has had no problems from the biopsy site.  He had tried some OTC ketoconazole for a few days without much change- possibly slightly better.  Past Medical History:  Diagnosis Date  . Aortic aneurysm (HCC)   . 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 Dyslipidemia   . Gout   . Hemorrhoids   . HEMORRHOIDS-INTERNAL 12/20/2009  . History of diverticulitis 03/2018  . HYPERLIPIDEMIA-MIXED 04/01/2009  . HYPOTHYROIDISM 08/26/2008  . INSOMNIA, TRANSIENT 04/27/2009  . Kidney stones   . KNEE PAIN 11/02/2008  . LATERAL EPICONDYLITIS 06/02/2009  . Migraines   . ONYCHOMYCOSIS 11/02/2008  . PERSONAL HX COLONIC POLYPS 12/20/2009  . Sudden visual loss 12/25/2008  . TRANSIENT ISCHEMIC ATTACK 01/25/2009    Past Surgical History:  Procedure Laterality Date  . CYSTECTOMY     from abdominal wall  . MYRINGOTOMY WITH TUBE PLACEMENT Left   . 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 TONSILLECTOMY      Family History  Problem Relation Age of Onset  . Diabetes Mother   . Heart disease Father        valve problem    Social History   Socioeconomic History  . Marital status: Married    Spouse name: Not on file  . Number of children: Not on file  . Years of education: Not on file  . Highest education level: Not on file  Occupational History  . Not on file  Tobacco Use  . Smoking status: Former Games developer  . Smokeless  tobacco: Never Used  Vaping Use  . Vaping Use: Never used  Substance and Sexual Activity  . Alcohol use: Yes    Alcohol/week: 14.0 standard drinks    Types: 14 Glasses of wine per week    Comment: couple glasses of wine each day  . Drug use: No  . Sexual activity: Not on file  Other Topics Concern  . Not on file  Social History Narrative  . Not on file   Social Determinants of Health   Financial Resource Strain:   . Difficulty of Paying Living Expenses:   Food Insecurity:   . Worried About Programme researcher, broadcasting/film/video in the Last Year:   . Barista in the Last Year:   Transportation Needs:   . Freight forwarder (Medical):   Marland Kitchen Lack of Transportation (Non-Medical):   Physical Activity:   . Days of Exercise per Week:   . Minutes of Exercise per Session:   Stress:   . Feeling of Stress :   Social Connections:   . Frequency of Communication with Friends and Family:   . Frequency of Social Gatherings with Friends and Family:   . Attends Religious Services:   . Active Member of Clubs or Organizations:   . Attends Club or  Organization Meetings:   Marland Kitchen Marital Status:   Intimate Partner Violence:   . Fear of Current or Ex-Partner:   . Emotionally Abused:   Marland Kitchen Physically Abused:   . Sexually Abused:     Outpatient Medications Prior to Visit  Medication Sig Dispense Refill  . allopurinol (ZYLOPRIM) 300 MG tablet TAKE 1 TABLET BY MOUTH EVERY DAY 90 tablet 3  . ciclopirox (PENLAC) 8 % solution APPLY TO ALL TOENAILS DAILY, ONCE WEEKLY CLEANSE NAILS THOROUGHLY WITH NAIL POLISH REMOVER    . clindamycin (CLEOCIN) 150 MG capsule TAKE 4 CAPSULES BY MOUTH 1 HOUR PRIOR TO DENTAL APPOINTMENT    . colchicine (COLCRYS) 0.6 MG tablet TAKE 1 TABLET BY MOUTH TWICE DAILY AS NEEDED FOR ACUTE GOUT 180 tablet 1  . enoxaparin (LOVENOX) 120 MG/0.8ML injection Inject 0.8 mLs (120 mg total) into the skin every 12 (twelve) hours. 10.4 mL 0  . ketoconazole (NIZORAL) 2 % cream     . levothyroxine  (SYNTHROID) 150 MCG tablet TAKE 1 TABLET BY MOUTH EVERY DAY 90 tablet 2  . oxyCODONE (OXY IR/ROXICODONE) 5 MG immediate release tablet oxycodone 5 mg tablet  Take 1 tablet twice a day by oral route as needed.    . warfarin (COUMADIN) 5 MG tablet TAKE 1 AND 1/2 TABLETS EVERY DAY EXCEPT 2 TABLETS ON SUNDAY, TUESDAY AND THURSDAY OR AS DIRECTED 150 tablet 0  . ciclopirox (LOPROX) 0.77 % cream Apply topically 2 (two) times daily. (Patient not taking: Reported on 12/17/2019) 15 g 1   No facility-administered medications prior to visit.    Allergies  Allergen Reactions  . Shellfish Allergy Anaphylaxis  . Penicillins     REACTION: childhood, reaction unknown    ROS Review of Systems  Constitutional: Negative for chills and fever.  Skin: Positive for rash.      Objective:    Physical Exam Vitals reviewed.  Constitutional:      Appearance: Normal appearance.  Cardiovascular:     Rate and Rhythm: Normal rate and regular rhythm.  Pulmonary:     Effort: Pulmonary effort is normal.     Breath sounds: Normal breath sounds.  Skin:    Findings: Rash present.     Comments: Biopsy site is healing, slightly crusted.  No drainage. No surrounding erythema.  2 sutures removed.    Neurological:     Mental Status: He is alert.     BP 120/62 (BP Location: Left Arm, Patient Position: Sitting, Cuff Size: Normal)   Pulse 70   Temp 97.6 F (36.4 C) (Temporal)   Wt 252 lb 3.2 oz (114.4 kg)   SpO2 96%   BMI 33.27 kg/m  Wt Readings from Last 3 Encounters:  12/17/19 252 lb 3.2 oz (114.4 kg)  12/08/19 258 lb 8 oz (117.3 kg)  11/03/19 256 lb 14.4 oz (116.5 kg)     Health Maintenance Due  Topic Date Due  . Hepatitis C Screening  Never done  . PNA vac Low Risk Adult (1 of 2 - PCV13) Never done    There are no preventive care reminders to display for this patient.  Lab Results  Component Value Date   TSH 2.76 03/27/2019   Lab Results  Component Value Date   WBC 7.8 07/21/2019   HGB  16.0 07/21/2019   HCT 46.3 07/21/2019   MCV 92.5 07/21/2019   PLT 158.0 07/21/2019   Lab Results  Component Value Date   NA 136 03/27/2019   K 4.1 03/27/2019   CO2  27 03/27/2019   GLUCOSE 94 03/27/2019   BUN 15 03/27/2019   CREATININE 0.87 03/27/2019   BILITOT 1.1 05/21/2019   ALKPHOS 60 05/21/2019   AST 69 (H) 05/21/2019   ALT 119 (H) 05/21/2019   PROT 6.3 05/21/2019   ALBUMIN 4.3 05/21/2019   CALCIUM 9.1 03/27/2019   GFR 87.11 03/27/2019   Lab Results  Component Value Date   CHOL 160 04/28/2019   Lab Results  Component Value Date   HDL 35 (L) 04/28/2019   Lab Results  Component Value Date   LDLCALC 104 (H) 04/28/2019   Lab Results  Component Value Date   TRIG 115 04/28/2019   Lab Results  Component Value Date   CHOLHDL 4.6 04/28/2019   Lab Results  Component Value Date   HGBA1C  02/23/2009    5.8 (NOTE) The ADA recommends the following therapeutic goal for glycemic control related to Hgb A1c measurement: Goal of therapy: <6.5 Hgb A1c  Reference: American Diabetes Association: Clinical Practice Recommendations 2010, Diabetes Care, 2010, 33: (Suppl  1).      Assessment & Plan:   #1 skin rash predominately involving left lower leg.  He has some mild involvement of the right leg as well.  Recent biopsy revealing dermatophyte infection -Loprox cream and use twice daily and be in touch if this is not starting to clear and about 3 weeks. -reviewed biopsy results with patient.   No orders of the defined types were placed in this encounter.   Follow-up: No follow-ups on file.    [name], MD

## 2019-12-17 NOTE — Patient Instructions (Signed)
Let me know if skin rash not clearing in 3 weeks.

## 2019-12-17 NOTE — Patient Instructions (Addendum)
Pre visit review using our clinic review tool, if applicable. No additional management support is needed unless otherwise documented below in the visit note.  Take 2 tablets today (7/7), take 2 1/2 tablets tomorrow (7/8), take 2 tablets on Friday and 2 tablets on Saturday (7/9 and 7/10).  On Sunday start taking 2 tablets daily except 1 1/2 tablets on Sunday.  Re-check in 2 weeks.  I encouraged patient to be consistent with diet.

## 2019-12-22 ENCOUNTER — Ambulatory Visit: Payer: Medicare Other

## 2019-12-24 ENCOUNTER — Other Ambulatory Visit: Payer: Self-pay

## 2019-12-24 ENCOUNTER — Ambulatory Visit
Admission: RE | Admit: 2019-12-24 | Discharge: 2019-12-24 | Disposition: A | Discharge status: Home / Self Care | Payer: Medicare Other | Source: Ambulatory Visit | Attending: Orthopedic Surgery | Admitting: Orthopedic Surgery

## 2019-12-24 DIAGNOSIS — M25512 Pain in left shoulder: Secondary | ICD-10-CM

## 2019-12-24 DIAGNOSIS — M19012 Primary osteoarthritis, left shoulder: Secondary | ICD-10-CM | POA: Diagnosis not present

## 2019-12-26 DIAGNOSIS — M67912 Unspecified disorder of synovium and tendon, left shoulder: Secondary | ICD-10-CM | POA: Diagnosis not present

## 2019-12-31 ENCOUNTER — Ambulatory Visit: Payer: Medicare Other

## 2020-01-28 DIAGNOSIS — M25512 Pain in left shoulder: Secondary | ICD-10-CM | POA: Diagnosis not present

## 2020-01-29 ENCOUNTER — Telehealth: Payer: Self-pay | Admitting: Family Medicine

## 2020-01-29 ENCOUNTER — Other Ambulatory Visit: Payer: Self-pay

## 2020-01-29 NOTE — Telephone Encounter (Signed)
I called patient and spoke with him and patient informed me that he was at an restaurant and asked if I could call him 10 minutes later. I called him 10 minutes later and the phone went to voicemail. I left a voicemail for patient and informed him I would call back in a few minutes. Called patient back for the 3rd time and call again went to voicemail. I left message this time informing patient to call front desk back and reschedule

## 2020-01-29 NOTE — Progress Notes (Signed)
Erroneous encounter

## 2020-01-31 ENCOUNTER — Other Ambulatory Visit: Payer: Self-pay | Admitting: Family Medicine

## 2020-01-31 DIAGNOSIS — Z7901 Long term (current) use of anticoagulants: Secondary | ICD-10-CM

## 2020-02-02 NOTE — Telephone Encounter (Signed)
Okay for refill?  

## 2020-02-22 ENCOUNTER — Other Ambulatory Visit: Payer: Self-pay | Admitting: Family Medicine

## 2020-03-02 ENCOUNTER — Other Ambulatory Visit: Payer: Medicare Other

## 2020-03-02 DIAGNOSIS — Z20822 Contact with and (suspected) exposure to covid-19: Secondary | ICD-10-CM

## 2020-03-04 LAB — NOVEL CORONAVIRUS, NAA: SARS-CoV-2, NAA: NOT DETECTED

## 2020-03-04 LAB — SARS-COV-2, NAA 2 DAY TAT

## 2020-03-08 ENCOUNTER — Other Ambulatory Visit: Payer: Self-pay

## 2020-03-08 ENCOUNTER — Other Ambulatory Visit: Payer: Self-pay | Admitting: General Practice

## 2020-03-08 ENCOUNTER — Ambulatory Visit (INDEPENDENT_AMBULATORY_CARE_PROVIDER_SITE_OTHER): Payer: Medicare Other | Admitting: General Practice

## 2020-03-08 DIAGNOSIS — Z7901 Long term (current) use of anticoagulants: Secondary | ICD-10-CM

## 2020-03-08 LAB — POCT INR: INR: 2.2 (ref 2.0–3.0)

## 2020-03-08 MED ORDER — WARFARIN SODIUM 5 MG PO TABS: ORAL_TABLET | ORAL | 0 refills | Status: DC

## 2020-03-08 NOTE — Patient Instructions (Addendum)
Pre visit review using our clinic review tool, if applicable. No additional management support is needed unless otherwise documented below in the visit note.  Take 1 tablets today (9/27) and then continue to take 2 tablets daily except 1 1/2 tablets on Sundays.  Re-check in 6 weeks.

## 2020-03-11 DIAGNOSIS — Z79891 Long term (current) use of opiate analgesic: Secondary | ICD-10-CM | POA: Diagnosis not present

## 2020-03-24 ENCOUNTER — Ambulatory Visit (INDEPENDENT_AMBULATORY_CARE_PROVIDER_SITE_OTHER): Payer: Medicare Other

## 2020-03-24 ENCOUNTER — Other Ambulatory Visit: Payer: Self-pay

## 2020-03-24 DIAGNOSIS — Z0000 Encounter for general adult medical examination without abnormal findings: Secondary | ICD-10-CM

## 2020-03-24 NOTE — Patient Instructions (Addendum)
Mr. Samuel [name] , Thank you for taking time to come for your Medicare Wellness Visit. I appreciate your ongoing commitment to your health goals. Please review the following plan we discussed and let me know if I can assist you in the future.   Screening recommendations/referrals: Colonoscopy: Currently due, please contact Dr. Jennye Boroughs office when you get a chance  Recommended yearly ophthalmology/optometry visit for glaucoma screening and checkup Recommended yearly dental visit for hygiene and checkup  Vaccinations: Influenza vaccine: Patient politely declined Pneumococcal vaccine: Currently due, you may receive your first dose in our office. Tdap vaccine: Up to date, next due 11/06/2023 Shingles vaccine: Currently due, please contact your pharmacy to discuss cost and to receive the vaccine     Advanced directives: Copies on file   Conditions/risks identified: None   Next appointment: 04/19/2020 @ 3:00 PM for Coumadin Clinic   Preventive Care 65 Years and Older, Male Preventive care refers to lifestyle choices and visits with your health care provider that can promote health and wellness. What does preventive care include?  A yearly physical exam. This is also called an annual well check.  Dental exams once or twice a year.  Routine eye exams. Ask your health care provider how often you should have your eyes checked.  Personal lifestyle choices, including:  Daily care of your teeth and gums.  Regular physical activity.  Eating a healthy diet.  Avoiding tobacco and drug use.  Limiting alcohol use.  Practicing safe sex.  Taking low doses of aspirin every day.  Taking vitamin and mineral supplements as recommended by your health care provider. What happens during an annual well check? The services and screenings done by your health care provider during your annual well check will depend on your age, overall health, lifestyle risk factors, and family history of  disease. Counseling  Your health care provider may ask you questions about your:  Alcohol use.  Tobacco use.  Drug use.  Emotional well-being.  Home and relationship well-being.  Sexual activity.  Eating habits.  History of falls.  Memory and ability to understand (cognition).  Work and work Astronomer. Screening  You may have the following tests or measurements:  Height, weight, and BMI.  Blood pressure.  Lipid and cholesterol levels. These may be checked every 5 years, or more frequently if you are over 72 years old.  Skin check.  Lung cancer screening. You may have this screening every year starting at age 23 if you have a 30-pack-year history of smoking and currently smoke or have quit within the past 15 years.  Fecal occult blood test (FOBT) of the stool. You may have this test every year starting at age 26.  Flexible sigmoidoscopy or colonoscopy. You may have a sigmoidoscopy every 5 years or a colonoscopy every 10 years starting at age 24.  Prostate cancer screening. Recommendations will vary depending on your family history and other risks.  Hepatitis C blood test.  Hepatitis B blood test.  Sexually transmitted disease (STD) testing.  Diabetes screening. This is done by checking your blood sugar (glucose) after you have not eaten for a while (fasting). You may have this done every 1-3 years.  Abdominal aortic aneurysm (AAA) screening. You may need this if you are a current or former smoker.  Osteoporosis. You may be screened starting at age 4 if you are at high risk. Talk with your health care provider about your test results, treatment options, and if necessary, the need for more tests. Vaccines  Your health care provider may recommend certain vaccines, such as:  Influenza vaccine. This is recommended every year.  Tetanus, diphtheria, and acellular pertussis (Tdap, Td) vaccine. You may need a Td booster every 10 years.  Zoster vaccine. You may  need this after age 54.  Pneumococcal 13-valent conjugate (PCV13) vaccine. One dose is recommended after age 45.  Pneumococcal polysaccharide (PPSV23) vaccine. One dose is recommended after age 101. Talk to your health care provider about which screenings and vaccines you need and how often you need them. This information is not intended to replace advice given to you by your health care provider. Make sure you discuss any questions you have with your health care provider. Document Released: 06/25/2015 Document Revised: 02/16/2016 Document Reviewed: 03/30/2015 Elsevier Interactive Patient Education  2017 [place] Prevention in the Home Falls can cause injuries. They can happen to people of all ages. There are many things you can do to make your home safe and to help prevent falls. What can I do on the outside of my home?  Regularly fix the edges of walkways and driveways and fix any cracks.  Remove anything that might make you trip as you walk through a door, such as a raised step or threshold.  Trim any bushes or trees on the path to your home.  Use bright outdoor lighting.  Clear any walking paths of anything that might make someone trip, such as rocks or tools.  Regularly check to see if handrails are loose or broken. Make sure that both sides of any steps have handrails.  Any raised decks and porches should have guardrails on the edges.  Have any leaves, snow, or ice cleared regularly.  Use sand or salt on walking paths during winter.  Clean up any spills in your garage right away. This includes oil or grease spills. What can I do in the bathroom?  Use night lights.  Install grab bars by the toilet and in the tub and shower. Do not use towel bars as grab bars.  Use non-skid mats or decals in the tub or shower.  If you need to sit down in the shower, use a plastic, non-slip stool.  Keep the floor dry. Clean up any water that spills on the floor as soon as it  happens.  Remove soap buildup in the tub or shower regularly.  Attach bath mats securely with double-sided non-slip rug tape.  Do not have throw rugs and other things on the floor that can make you trip. What can I do in the bedroom?  Use night lights.  Make sure that you have a light by your bed that is easy to reach.  Do not use any sheets or blankets that are too big for your bed. They should not hang down onto the floor.  Have a firm chair that has side arms. You can use this for support while you get dressed.  Do not have throw rugs and other things on the floor that can make you trip. What can I do in the kitchen?  Clean up any spills right away.  Avoid walking on wet floors.  Keep items that you use a lot in easy-to-reach places.  If you need to reach something above you, use a strong step stool that has a grab bar.  Keep electrical cords out of the way.  Do not use floor polish or wax that makes floors slippery. If you must use wax, use non-skid floor wax.  Do  not have throw rugs and other things on the floor that can make you trip. What can I do with my stairs?  Do not leave any items on the stairs.  Make sure that there are handrails on both sides of the stairs and use them. Fix handrails that are broken or loose. Make sure that handrails are as long as the stairways.  Check any carpeting to make sure that it is firmly attached to the stairs. Fix any carpet that is loose or worn.  Avoid having throw rugs at the top or bottom of the stairs. If you do have throw rugs, attach them to the floor with carpet tape.  Make sure that you have a light switch at the top of the stairs and the bottom of the stairs. If you do not have them, ask someone to add them for you. What else can I do to help prevent falls?  Wear shoes that:  Do not have high heels.  Have rubber bottoms.  Are comfortable and fit you well.  Are closed at the toe. Do not wear sandals.  If you  use a stepladder:  Make sure that it is fully opened. Do not climb a closed stepladder.  Make sure that both sides of the stepladder are locked into place.  Ask someone to hold it for you, if possible.  Clearly mark and make sure that you can see:  Any grab bars or handrails.  First and last steps.  Where the edge of each step is.  Use tools that help you move around (mobility aids) if they are needed. These include:  Canes.  Walkers.  Scooters.  Crutches.  Turn on the lights when you go into a dark area. Replace any light bulbs as soon as they burn out.  Set up your furniture so you have a clear path. Avoid moving your furniture around.  If any of your floors are uneven, fix them.  If there are any pets around you, be aware of where they are.  Review your medicines with your doctor. Some medicines can make you feel dizzy. This can increase your chance of falling. Ask your doctor what other things that you can do to help prevent falls. This information is not intended to replace advice given to you by your health care provider. Make sure you discuss any questions you have with your health care provider. Document Released: 03/25/2009 Document Revised: 11/04/2015 Document Reviewed: 07/03/2014 Elsevier Interactive Patient Education  2017 [employer].

## 2020-03-24 NOTE — Progress Notes (Signed)
Subjective:   Samuel [name] is a 69 y.o. male who presents for an Initial Medicare Annual Wellness Visit.  I connected with [name]  today by telephone and verified that I am speaking with the correct person using two identifiers. Location patient: home Location provider: work Persons participating in the virtual visit: patient, provider.   I discussed the limitations, risks, security and privacy concerns of performing an evaluation and management service by telephone and the availability of in person appointments. I also discussed with the patient that there may be a patient responsible charge related to this service. The patient expressed understanding and verbally consented to this telephonic visit.    Interactive audio and video telecommunications were attempted between this provider and patient, however failed, due to patient having technical difficulties OR patient did not have access to video capability.  We continued and completed visit with audio only.      Review of Systems    N/A Cardiac Risk Factors include: advanced age (>28men, >55 women);male gender;dyslipidemia     Objective:    Today's Vitals   03/24/20 1400  PainSc: 3    There is no height or weight on file to calculate BMI.  Advanced Directives 03/24/2020  Does Patient Have a Medical Advance Directive? Yes  Type of Estate agent of [place];Living will  Does patient want to make changes to medical advance directive? No - Patient declined  Copy of Healthcare Power of Attorney in Chart? Yes - validated most recent copy scanned in chart (See row information)    Current Medications (verified) Outpatient Encounter Medications as of 03/24/2020  Medication Sig  . allopurinol (ZYLOPRIM) 300 MG tablet TAKE 1 TABLET BY MOUTH EVERY DAY  . ketoconazole (NIZORAL) 2 % cream   . levothyroxine (SYNTHROID) 150 MCG tablet TAKE 1 TABLET BY MOUTH EVERY DAY  . oxyCODONE (OXY IR/ROXICODONE) 5 MG  immediate release tablet oxycodone 5 mg tablet  Take 1 tablet twice a day by oral route as needed.  . warfarin (COUMADIN) 5 MG tablet Take 2 tablets daily except take 1 1/2 tablets on Sunday or Take as directed by anticoagulation clinic  . ciclopirox (LOPROX) 0.77 % cream APPLY TO AFFECTED AREA TWICE A DAY (Patient not taking: Reported on 03/24/2020)  . ciclopirox (PENLAC) 8 % solution APPLY TO ALL TOENAILS DAILY, ONCE WEEKLY CLEANSE NAILS THOROUGHLY WITH NAIL POLISH REMOVER (Patient not taking: Reported on 03/24/2020)  . clindamycin (CLEOCIN) 150 MG capsule TAKE 4 CAPSULES BY MOUTH 1 HOUR PRIOR TO DENTAL APPOINTMENT (Patient not taking: Reported on 03/24/2020)  . colchicine (COLCRYS) 0.6 MG tablet TAKE 1 TABLET BY MOUTH TWICE DAILY AS NEEDED FOR ACUTE GOUT (Patient not taking: Reported on 03/24/2020)  . enoxaparin (LOVENOX) 120 MG/0.8ML injection Inject 0.8 mLs (120 mg total) into the skin every 12 (twelve) hours. (Patient not taking: Reported on 03/24/2020)   No facility-administered encounter medications on file as of 03/24/2020.    Allergies (verified) Shellfish allergy and Penicillins   History: Past Medical History:  Diagnosis Date  . Aortic aneurysm (HCC)   . 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 Dyslipidemia   . Gout   . Hemorrhoids   . HEMORRHOIDS-INTERNAL 12/20/2009  . History of diverticulitis 03/2018  . HYPERLIPIDEMIA-MIXED 04/01/2009  . HYPOTHYROIDISM 08/26/2008  . INSOMNIA, TRANSIENT 04/27/2009  . Kidney stones   . KNEE PAIN 11/02/2008  . LATERAL EPICONDYLITIS 06/02/2009  . Migraines   . ONYCHOMYCOSIS 11/02/2008  . PERSONAL HX COLONIC POLYPS 12/20/2009  .  Sudden visual loss 12/25/2008  . TRANSIENT ISCHEMIC ATTACK 01/25/2009   Past Surgical History:  Procedure Laterality Date  . CYSTECTOMY     from abdominal wall  . MYRINGOTOMY WITH TUBE PLACEMENT Left   . 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 TONSILLECTOMY     Family History  Problem Relation Age of  Onset  . Diabetes Mother   . Heart disease Father        valve problem   Social History   Socioeconomic History  . Marital status: Married    Spouse name: Not on file  . Number of children: Not on file  . Years of education: Not on file  . Highest education level: Not on file  Occupational History  . Not on file  Tobacco Use  . Smoking status: Former Games developermoker  . Smokeless tobacco: Never Used  Vaping Use  . Vaping Use: Never used  Substance and Sexual Activity  . Alcohol use: Yes    Alcohol/week: 14.0 standard drinks    Types: 14 Glasses of wine per week    Comment: couple glasses of wine each day  . Drug use: No  . Sexual activity: Not on file  Other Topics Concern  . Not on file  Social History Narrative  . Not on file   Social Determinants of Health   Financial Resource Strain: Low Risk   . Difficulty of Paying Living Expenses: Not hard at all  Food Insecurity: No Food Insecurity  . Worried About Programme researcher, broadcasting/film/videounning Out of Food in the Last Year: Never true  . Ran Out of Food in the Last Year: Never true  Transportation Needs: No Transportation Needs  . Lack of Transportation (Medical): No  . Lack of Transportation (Non-Medical): No  Physical Activity: Insufficiently Active  . Days of Exercise per Week: 3 days  . Minutes of Exercise per Session: 30 min  Stress: Stress Concern Present  . Feeling of Stress : To some extent  Social Connections: Moderately Integrated  . Frequency of Communication with Friends and Family: More than three times a week  . Frequency of Social Gatherings with Friends and Family: Three times a week  . Attends Religious Services: Never  . Active Member of Clubs or Organizations: Yes  . Attends BankerClub or Organization Meetings: More than 4 times per year  . Marital Status: Married    Tobacco Counseling Counseling given: Not Answered   Clinical Intake:  Pre-visit preparation completed: Yes  Pain : 0-10 Pain Score: 3  Pain Type: Chronic pain Pain  Location: Back Pain Orientation: Lower, Upper Pain Descriptors / Indicators: Dull, Aching Pain Onset: More than a month ago Pain Frequency: Constant Pain Relieving Factors: oxycodone  Pain Relieving Factors: oxycodone  Nutritional Risks: None Diabetes: No  How often do you need to have someone help you when you read instructions, pamphlets, or other written materials from your doctor or pharmacy?: 1 - Never What is the last grade level you completed in school?: Undergraduate Degree  Diabetic?No   Interpreter Needed?: No  Information entered by :: SCrews,LPN   Activities of Daily Living In your present state of health, do you have any difficulty performing the following activities: 03/24/2020  Hearing? Y  Comment has hearing issues in both ears but worse in the left ear. Hearing aid is recommended but states that he could not hear enough difference to justify  Vision? N  Difficulty concentrating or making decisions? Y  Comment Has issues  with memory questions  Walking or climbing stairs? N  Dressing or bathing? N  Doing errands, shopping? N  Preparing Food and eating ? N  Using the Toilet? N  In the past six months, have you accidently leaked urine? N  Do you have problems with loss of bowel control? N  Managing your Medications? N  Managing your Finances? N  Housekeeping or managing your Housekeeping? N  Some recent data might be hidden    Patient Care Team: Kristian Covey, MD as PCP - General Pricilla Riffle, MD as PCP - Cardiology (Cardiology)  Indicate any recent Medical Services you may have received from other than [facility] providers in the past year (date may be approximate).     Assessment:   This is a routine wellness examination for Darvis.  Hearing/Vision screen  Hearing Screening   125Hz  250Hz  500Hz  1000Hz  2000Hz  3000Hz  4000Hz  6000Hz  8000Hz   Right ear:           Left ear:           Vision Screening Comments: Patient states has not had an eye exam in a  few years   Dietary issues and exercise activities discussed: Current Exercise Habits: Home exercise routine, Type of exercise: walking, Time (Minutes): 45, Frequency (Times/Week): 3, Weekly Exercise (Minutes/Week): 135, Intensity: Mild, Exercise limited by: orthopedic condition(s)  Goals    . Patient stated     I will continue to play golf and will continue to walk for at least 45 minutes a few days per week.      Depression Screen PHQ 2/9 Scores 03/24/2020 02/13/2018 08/14/2016  PHQ - 2 Score 2 0 0  PHQ- 9 Score 4 - -    Fall Risk Fall Risk  03/24/2020 11/03/2019 05/05/2019 02/13/2018 08/14/2016  Falls in the past year? 0 0 1 No No  Comment - - Emmi Telephone Survey: data to providers prior to load - -  Number falls in past yr: 0 0 1 - -  Comment - - Emmi Telephone Survey Actual Response = 1 - -  Injury with Fall? 0 0 1 - -  Risk for fall due to : No Fall Risks - - - -  Follow up Falls evaluation completed;Falls prevention discussed - - - -    Any stairs in or around the home? Yes  If so, are there any without handrails? No  Home free of loose throw rugs in walkways, pet beds, electrical cords, etc? Yes  Adequate lighting in your home to reduce risk of falls? Yes   ASSISTIVE DEVICES UTILIZED TO PREVENT FALLS:  Life alert? No  Use of a cane, walker or w/c? No  Grab bars in the bathroom? Yes  Shower chair or bench in shower? No  Elevated toilet seat or a handicapped toilet? No     Cognitive Function:     6CIT Screen 03/24/2020  What time? 0 points  Count back from 20 0 points  Months in reverse 0 points  Repeat phrase 0 points    Immunizations Immunization History  Administered Date(s) Administered  . Moderna SARS-COVID-2 Vaccination 07/24/2019, 08/21/2019  . Tdap 11/05/2013    TDAP status: Up to date Flu Vaccine status: Declined, Education has been provided regarding the importance of this vaccine but patient still declined. Advised may receive this vaccine at  local pharmacy or Health Dept. Aware to provide a copy of the vaccination record if obtained from local pharmacy or Health Dept. Verbalized acceptance and understanding. Pneumococcal  vaccine status: Declined,  Education has been provided regarding the importance of this vaccine but patient still declined. Advised may receive this vaccine at local pharmacy or Health Dept. Aware to provide a copy of the vaccination record if obtained from local pharmacy or Health Dept. Verbalized acceptance and understanding.  Covid-19 vaccine status: Completed vaccines  Qualifies for Shingles Vaccine? Yes   Zostavax completed No   Shingrix Completed?: No.    Education has been provided regarding the importance of this vaccine. Patient has been advised to call insurance company to determine out of pocket expense if they have not yet received this vaccine. Advised may also receive vaccine at local pharmacy or Health Dept. Verbalized acceptance and understanding.  Screening Tests Health Maintenance  Topic Date Due  . Hepatitis C Screening  Never done  . PNA vac Low Risk Adult (1 of 2 - PCV13) Never done  . TETANUS/TDAP  11/06/2023  . COLONOSCOPY  10/21/2024  . COVID-19 Vaccine  Completed  . INFLUENZA VACCINE  Discontinued    Health Maintenance  Health Maintenance Due  Topic Date Due  . Hepatitis C Screening  Never done  . PNA vac Low Risk Adult (1 of 2 - PCV13) Never done    Colorectal cancer screening: Completed 10/22/2014. Repeat every 10 years  Lung Cancer Screening: (Low Dose CT Chest recommended if Age 9-80 years, 30 pack-year currently smoking OR have quit w/in 15years.) does not qualify.   Lung Cancer Screening Referral: N/A   Additional Screening:  Hepatitis C Screening: does qualify;  Vision Screening: Recommended annual ophthalmology exams for early detection of glaucoma and other disorders of the eye. Is the patient up to date with their annual eye exam?  No  Who is the provider or what  is the name of the office in which the patient attends annual eye exams? Eye doctor in [place] on [address]  If pt is not established with a provider, would they like to be referred to a provider to establish care? No .   Dental Screening: Recommended annual dental exams for proper oral hygiene  Community Resource Referral / Chronic Care Management: CRR required this visit?  No   CCM required this visit?  No      Plan:     I have personally reviewed and noted the following in the patient's chart:   . Medical and social history . Use of alcohol, tobacco or illicit drugs  . Current medications and supplements . Functional ability and status . Nutritional status . Physical activity . Advanced directives . List of other physicians . Hospitalizations, surgeries, and ER visits in previous 12 months . Vitals . Screenings to include cognitive, depression, and falls . Referrals and appointments  In addition, I have reviewed and discussed with patient certain preventive protocols, quality metrics, and best practice recommendations. A written personalized care plan for preventive services as well as general preventive health recommendations were provided to patient.     Theodora Blow, LPN   [id number]   Nurse Notes: None

## 2020-04-01 ENCOUNTER — Ambulatory Visit: Payer: Self-pay

## 2020-04-08 ENCOUNTER — Ambulatory Visit: Payer: Medicare Other | Attending: Internal Medicine

## 2020-04-08 DIAGNOSIS — Z23 Encounter for immunization: Secondary | ICD-10-CM

## 2020-04-08 NOTE — Progress Notes (Addendum)
   Covid-19 Vaccination Clinic  Name:  DAINE GUNTHER    MRN: [id number] DOB: [birth year]  04/08/2020  Mr. Mehta was observed post Covid-19 immunization for 15 min (was to wait 30 due to shellfish and penicillin allergy reaction and refused) without incident. He was provided with Vaccine Information Sheet and instruction to access the V-Safe system.   Mr. Wolz was instructed to call 911 with any severe reactions post vaccine: Marland Kitchen Difficulty breathing  . Swelling of face and throat  . A fast heartbeat  . A bad rash all over body  . Dizziness and weakness

## 2020-04-19 ENCOUNTER — Other Ambulatory Visit: Payer: Self-pay

## 2020-04-19 ENCOUNTER — Ambulatory Visit (INDEPENDENT_AMBULATORY_CARE_PROVIDER_SITE_OTHER): Payer: Medicare Other | Admitting: General Practice

## 2020-04-19 DIAGNOSIS — Z7901 Long term (current) use of anticoagulants: Secondary | ICD-10-CM | POA: Diagnosis not present

## 2020-04-19 LAB — POCT INR: INR: 2 (ref 2.0–3.0)

## 2020-04-19 NOTE — Patient Instructions (Addendum)
Pre visit review using our clinic review tool, if applicable. No additional management support is needed unless otherwise documented below in the visit note.  Change dosage and take 10 mg daily.  Re-check in 6 weeks.

## 2020-04-28 ENCOUNTER — Other Ambulatory Visit: Payer: Medicare Other

## 2020-04-28 DIAGNOSIS — Z20822 Contact with and (suspected) exposure to covid-19: Secondary | ICD-10-CM | POA: Diagnosis not present

## 2020-04-29 LAB — SARS-COV-2, NAA 2 DAY TAT

## 2020-04-29 LAB — NOVEL CORONAVIRUS, NAA: SARS-CoV-2, NAA: NOT DETECTED

## 2020-05-31 ENCOUNTER — Ambulatory Visit (INDEPENDENT_AMBULATORY_CARE_PROVIDER_SITE_OTHER): Payer: Medicare Other | Admitting: General Practice

## 2020-05-31 ENCOUNTER — Other Ambulatory Visit: Payer: Self-pay

## 2020-05-31 ENCOUNTER — Other Ambulatory Visit: Payer: Self-pay | Admitting: Family Medicine

## 2020-05-31 DIAGNOSIS — Z7901 Long term (current) use of anticoagulants: Secondary | ICD-10-CM | POA: Diagnosis not present

## 2020-05-31 LAB — POCT INR: INR: 3.6 — AB (ref 2.0–3.0)

## 2020-05-31 NOTE — Patient Instructions (Addendum)
Pre visit review using our clinic review tool, if applicable. No additional management support is needed unless otherwise documented below in the visit note.  Skip dosage tomorrow (12/21) and then change dosage back to 10 mg daily except 7.5 mg on Sundays only.  Re-check in 4 weeks.

## 2020-06-21 ENCOUNTER — Ambulatory Visit (INDEPENDENT_AMBULATORY_CARE_PROVIDER_SITE_OTHER): Payer: Medicare Other | Admitting: Family Medicine

## 2020-06-21 ENCOUNTER — Encounter: Payer: Self-pay | Admitting: Family Medicine

## 2020-06-21 ENCOUNTER — Other Ambulatory Visit: Payer: Self-pay

## 2020-06-21 VITALS — BP 142/80 | HR 75 | Ht 73.0 in | Wt 233.0 lb

## 2020-06-21 DIAGNOSIS — R1011 Right upper quadrant pain: Secondary | ICD-10-CM

## 2020-06-21 LAB — CBC WITH DIFFERENTIAL/PLATELET
Basophils Absolute: 0 10*3/uL (ref 0.0–0.1)
Basophils Relative: 0.5 % (ref 0.0–3.0)
Eosinophils Absolute: 0.1 10*3/uL (ref 0.0–0.7)
Eosinophils Relative: 0.9 % (ref 0.0–5.0)
HCT: 47.1 % (ref 39.0–52.0)
Hemoglobin: 16.1 g/dL (ref 13.0–17.0)
Lymphocytes Relative: 18.5 % (ref 12.0–46.0)
Lymphs Abs: 1.2 10*3/uL (ref 0.7–4.0)
MCHC: 34.2 g/dL (ref 30.0–36.0)
MCV: 91.1 fl (ref 78.0–100.0)
Monocytes Absolute: 0.7 10*3/uL (ref 0.1–1.0)
Monocytes Relative: 10.7 % (ref 3.0–12.0)
Neutro Abs: 4.6 10*3/uL (ref 1.4–7.7)
Neutrophils Relative %: 69.4 % (ref 43.0–77.0)
Platelets: 189 10*3/uL (ref 150.0–400.0)
RBC: 5.17 Mil/uL (ref 4.22–5.81)
RDW: 13.6 % (ref 11.5–15.5)
WBC: 6.7 10*3/uL (ref 4.0–10.5)

## 2020-06-21 LAB — COMPREHENSIVE METABOLIC PANEL
ALT: 36 U/L (ref 0–53)
AST: 33 U/L (ref 0–37)
Albumin: 5 g/dL (ref 3.5–5.2)
Alkaline Phosphatase: 67 U/L (ref 39–117)
BUN: 15 mg/dL (ref 6–23)
CO2: 30 mEq/L (ref 19–32)
Calcium: 9.4 mg/dL (ref 8.4–10.5)
Chloride: 100 mEq/L (ref 96–112)
Creatinine, Ser: 0.84 mg/dL (ref 0.40–1.50)
GFR: 88.77 mL/min (ref >60.00)
Glucose, Bld: 91 mg/dL (ref 70–99)
Potassium: 4 mEq/L (ref 3.5–5.1)
Sodium: 136 mEq/L (ref 135–145)
Total Bilirubin: 1.2 mg/dL (ref 0.2–1.2)
Total Protein: 7.2 g/dL (ref 6.0–8.3)

## 2020-06-21 LAB — LIPASE: Lipase: 26 U/L (ref 11.0–59.0)

## 2020-06-21 NOTE — Patient Instructions (Signed)
Abdominal Pain, Adult Pain in the abdomen (abdominal pain) can be caused by many things. Often, abdominal pain is not serious and it gets better with no treatment or by being treated at home. However, sometimes abdominal pain is serious. Your health care provider will ask questions about your medical history and do a physical exam to try to determine the cause of your abdominal pain. Follow these instructions at home: Medicines  Take over-the-counter and prescription medicines only as told by your health care provider.  Do not take a laxative unless told by your health care provider. General instructions  Watch your condition for any changes.  Drink enough fluid to keep your urine pale yellow.  Keep all follow-up visits as told by your health care provider. This is important.   Contact a health care provider if:  Your abdominal pain changes or gets worse.  You are not hungry or you lose weight without trying.  You are constipated or have diarrhea for more than 2-3 days.  You have pain when you urinate or have a bowel movement.  Your abdominal pain wakes you up at night.  Your pain gets worse with meals, after eating, or with certain foods.  You are vomiting and cannot keep anything down.  You have a fever.  You have blood in your urine. Get help right away if:  Your pain does not go away as soon as your health care provider told you to expect.  You cannot stop vomiting.  Your pain is only in areas of the abdomen, such as the right side or the left lower portion of the abdomen. Pain on the right side could be caused by appendicitis.  You have bloody or black stools, or stools that look like tar.  You have severe pain, cramping, or bloating in your abdomen.  You have signs of dehydration, such as: ? Dark urine, very little urine, or no urine. ? Cracked lips. ? Dry mouth. ? Sunken eyes. ? Sleepiness. ? Weakness.  You have trouble breathing or chest  pain. Summary  Often, abdominal pain is not serious and it gets better with no treatment or by being treated at home. However, sometimes abdominal pain is serious.  Watch your condition for any changes.  Take over-the-counter and prescription medicines only as told by your health care provider.  Contact a health care provider if your abdominal pain changes or gets worse.  Get help right away if you have severe pain, cramping, or bloating in your abdomen. This information is not intended to replace advice given to you by your health care provider. Make sure you discuss any questions you have with your health care provider. Document Revised: 07/18/2019 Document Reviewed: 10/07/2018 Elsevier Patient Education  2021 [employer]  

## 2020-06-21 NOTE — Progress Notes (Signed)
Established Patient Office Visit  Subjective:  Patient ID: Samuel [name], male    DOB: 21-Feb-1951  Age: 70 y.o. MRN: [id number]  CC:  Chief Complaint  Patient presents with  . Abdominal Pain    HPI Samuel [name] presents for recent transient abdominal pain.  His chronic problems include history of hypothyroidism, history of prior surgery for bicuspid aortic valve, chronic Coumadin therapy, lipidemia, gout.  He relates episode couple days ago of increased achy pain predominantly right upper quadrant.  This radiates some to the back.  No associated nausea or vomiting.  He did recently binge on some alcohol couple nights ago with whiskey and has been some in the past.  He was concerned that his pain may be somehow related to some kind of liver abnormality.  No known history of gallstones.  No recent fever.  No hematemesis.  No melena.  Avoids nonsteroidals.  No dizziness.  Currently pain-free.  Denied any recent chest pain.  No recent stool changes.  Past Medical History:  Diagnosis Date  . Aortic aneurysm (HCC)   . 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 Dyslipidemia   . Gout   . Hemorrhoids   . HEMORRHOIDS-INTERNAL 12/20/2009  . History of diverticulitis 03/2018  . HYPERLIPIDEMIA-MIXED 04/01/2009  . HYPOTHYROIDISM 08/26/2008  . INSOMNIA, TRANSIENT 04/27/2009  . Kidney stones   . KNEE PAIN 11/02/2008  . LATERAL EPICONDYLITIS 06/02/2009  . Migraines   . ONYCHOMYCOSIS 11/02/2008  . PERSONAL HX COLONIC POLYPS 12/20/2009  . Sudden visual loss 12/25/2008  . TRANSIENT ISCHEMIC ATTACK 01/25/2009    Past Surgical History:  Procedure Laterality Date  . CYSTECTOMY     from abdominal wall  . MYRINGOTOMY WITH TUBE PLACEMENT Left   . 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 TONSILLECTOMY      Family History  Problem Relation Age of Onset  . Diabetes Mother   . Heart disease Father        valve problem    Social History   Socioeconomic History  . Marital status: Married     Spouse name: Not on file  . Number of children: Not on file  . Years of education: Not on file  . Highest education level: Not on file  Occupational History  . Not on file  Tobacco Use  . Smoking status: Former Games developer  . Smokeless tobacco: Never Used  Vaping Use  . Vaping Use: Never used  Substance and Sexual Activity  . Alcohol use: Yes    Alcohol/week: 14.0 standard drinks    Types: 14 Glasses of wine per week    Comment: couple glasses of wine each day  . Drug use: No  . Sexual activity: Not on file  Other Topics Concern  . Not on file  Social History Narrative  . Not on file   Social Determinants of Health   Financial Resource Strain: Low Risk   . Difficulty of Paying Living Expenses: Not hard at all  Food Insecurity: No Food Insecurity  . Worried About Programme researcher, broadcasting/film/video in the Last Year: Never true  . Ran Out of Food in the Last Year: Never true  Transportation Needs: No Transportation Needs  . Lack of Transportation (Medical): No  . Lack of Transportation (Non-Medical): No  Physical Activity: Insufficiently Active  . Days of Exercise per Week: 3 days  . Minutes of Exercise per Session: 30 min  Stress: Stress Concern Present  . Feeling of  Stress : To some extent  Social Connections: Moderately Integrated  . Frequency of Communication with Friends and Family: More than three times a week  . Frequency of Social Gatherings with Friends and Family: Three times a week  . Attends Religious Services: Never  . Active Member of Clubs or Organizations: Yes  . Attends Banker Meetings: More than 4 times per year  . Marital Status: Married  Catering manager Violence: Not At Risk  . Fear of Current or Ex-Partner: No  . Emotionally Abused: No  . Physically Abused: No  . Sexually Abused: No    Outpatient Medications Prior to Visit  Medication Sig Dispense Refill  . allopurinol (ZYLOPRIM) 300 MG tablet TAKE 1 TABLET BY MOUTH EVERY DAY 90 tablet 3  .  ketoconazole (NIZORAL) 2 % cream     . levothyroxine (SYNTHROID) 150 MCG tablet TAKE 1 TABLET BY MOUTH EVERY DAY 90 tablet 2  . oxyCODONE (OXY IR/ROXICODONE) 5 MG immediate release tablet oxycodone 5 mg tablet  Take 1 tablet twice a day by oral route as needed.    . warfarin (COUMADIN) 5 MG tablet TAKE 2 TABS DAILY EXCEPT TAKE 1 AND 1/2 TABS ON SUNDAY AS INSTRUCTED BY ANTICOAGULATION CLINIC 180 tablet 0  . ciclopirox (LOPROX) 0.77 % cream APPLY TO AFFECTED AREA TWICE A DAY (Patient not taking: Reported on 03/24/2020) 15 g 1  . ciclopirox (PENLAC) 8 % solution APPLY TO ALL TOENAILS DAILY, ONCE WEEKLY CLEANSE NAILS THOROUGHLY WITH NAIL POLISH REMOVER (Patient not taking: Reported on 03/24/2020)    . clindamycin (CLEOCIN) 150 MG capsule TAKE 4 CAPSULES BY MOUTH 1 HOUR PRIOR TO DENTAL APPOINTMENT (Patient not taking: Reported on 03/24/2020)    . colchicine (COLCRYS) 0.6 MG tablet TAKE 1 TABLET BY MOUTH TWICE DAILY AS NEEDED FOR ACUTE GOUT (Patient not taking: Reported on 03/24/2020) 180 tablet 1  . enoxaparin (LOVENOX) 120 MG/0.8ML injection Inject 0.8 mLs (120 mg total) into the skin every 12 (twelve) hours. (Patient not taking: Reported on 03/24/2020) 10.4 mL 0   No facility-administered medications prior to visit.    Allergies  Allergen Reactions  . Shellfish Allergy Anaphylaxis  . Penicillins     REACTION: childhood, reaction unknown    ROS Review of Systems  Constitutional: Negative for chills and fever.  Respiratory: Negative for cough and shortness of breath.   Cardiovascular: Negative for chest pain.  Gastrointestinal: Positive for abdominal pain. Negative for constipation, diarrhea, nausea and vomiting.  Genitourinary: Negative for dysuria.  Neurological: Negative for dizziness and syncope.      Objective:    Physical Exam Vitals reviewed.  Constitutional:      Appearance: He is well-developed.  Cardiovascular:     Rate and Rhythm: Normal rate.  Pulmonary:     Effort:  Pulmonary effort is normal.     Breath sounds: Normal breath sounds.  Abdominal:     General: Abdomen is flat. Bowel sounds are normal.     Tenderness: There is no abdominal tenderness. There is no guarding or rebound.  Neurological:     Mental Status: He is alert.     BP (!) 142/80   Pulse 75   Ht 6\' 1"  (1.854 m)   Wt 233 lb (105.7 kg)   SpO2 97%   BMI 30.74 kg/m  Wt Readings from Last 3 Encounters:  06/21/20 233 lb (105.7 kg)  12/17/19 252 lb 3.2 oz (114.4 kg)  12/08/19 258 lb 8 oz (117.3 kg)  Health Maintenance Due  Topic Date Due  . Hepatitis C Screening  Never done  . PNA vac Low Risk Adult (1 of 2 - PCV13) Never done    There are no preventive care reminders to display for this patient.  Lab Results  Component Value Date   TSH 2.76 03/27/2019   Lab Results  Component Value Date   WBC 6.7 06/21/2020   HGB 16.1 06/21/2020   HCT 47.1 06/21/2020   MCV 91.1 06/21/2020   PLT 189.0 06/21/2020   Lab Results  Component Value Date   NA 136 06/21/2020   K 4.0 06/21/2020   CO2 30 06/21/2020   GLUCOSE 91 06/21/2020   BUN 15 06/21/2020   CREATININE 0.84 06/21/2020   BILITOT 1.2 06/21/2020   ALKPHOS 67 06/21/2020   AST 33 06/21/2020   ALT 36 06/21/2020   PROT 7.2 06/21/2020   ALBUMIN 5.0 06/21/2020   CALCIUM 9.4 06/21/2020   GFR 88.77 06/21/2020   Lab Results  Component Value Date   CHOL 160 04/28/2019   Lab Results  Component Value Date   HDL 35 (L) 04/28/2019   Lab Results  Component Value Date   LDLCALC 104 (H) 04/28/2019   Lab Results  Component Value Date   TRIG 115 04/28/2019   Lab Results  Component Value Date   CHOLHDL 4.6 04/28/2019   Lab Results  Component Value Date   HGBA1C  02/23/2009    5.8 (NOTE) The ADA recommends the following therapeutic goal for glycemic control related to Hgb A1c measurement: Goal of therapy: <6.5 Hgb A1c  Reference: American Diabetes Association: Clinical Practice Recommendations 2010, Diabetes Care,  2010, 33: (Suppl  1).      Assessment & Plan:   Patient presents with a couple of recent episodes of abdominal pain predominantly right upper quadrant lasting about 5 hours.  No known history of gallstones.  He did not have any associated nausea and vomiting.  He does have history of alcohol binging but this does not sound like acute pancreatitis with no associated nausea or vomiting.  Currently pain-free  -Strongly advised that he abstain from alcohol use and he plans to do so -Check labs with CBC, comprehensive metabolic panel, lipase -Set up abdominal ultrasound to further assess -Follow-up immediately for any recurrent abdominal pain, fever, or other concerns   No orders of the defined types were placed in this encounter.   Follow-up: No follow-ups on file.    [name], MD

## 2020-06-22 ENCOUNTER — Encounter: Payer: Self-pay | Admitting: Family Medicine

## 2020-06-22 ENCOUNTER — Telehealth: Payer: Self-pay

## 2020-06-22 NOTE — Telephone Encounter (Signed)
-----   Message from Kristian Covey, MD sent at 06/21/2020  5:59 PM EST ----- Labs reassuring.  Liver enzymes are normal.  Lipase normal.  CBC unremarkable.  We have placed order for abdominal ultrasound and we still need to get that to further assess

## 2020-06-22 NOTE — Telephone Encounter (Signed)
Informed patient of results and recommendations. 

## 2020-06-28 ENCOUNTER — Ambulatory Visit: Payer: Medicare Other

## 2020-07-05 ENCOUNTER — Other Ambulatory Visit: Payer: Self-pay

## 2020-07-05 ENCOUNTER — Ambulatory Visit (INDEPENDENT_AMBULATORY_CARE_PROVIDER_SITE_OTHER): Payer: Medicare Other | Admitting: General Practice

## 2020-07-05 DIAGNOSIS — Z7901 Long term (current) use of anticoagulants: Secondary | ICD-10-CM

## 2020-07-05 LAB — POCT INR: INR: 1.5 — AB (ref 2.0–3.0)

## 2020-07-05 NOTE — Patient Instructions (Addendum)
Pre visit review using our clinic review tool, if applicable. No additional management support is needed unless otherwise documented below in the visit note.  Take a total of 3 tablets (15 mg ) today and take 12.5 mg (2 1/2) tablets tomorrow.  On Wednesday continue to take 10 mg daily except 7.5 mg on Sundays only.  Re-check in 2 weeks.

## 2020-07-07 ENCOUNTER — Ambulatory Visit
Admission: RE | Admit: 2020-07-07 | Discharge: 2020-07-07 | Disposition: A | Discharge status: Home / Self Care | Payer: Medicare Other | Source: Ambulatory Visit | Attending: Family Medicine | Admitting: Family Medicine

## 2020-07-07 DIAGNOSIS — R1011 Right upper quadrant pain: Secondary | ICD-10-CM

## 2020-07-07 DIAGNOSIS — N281 Cyst of kidney, acquired: Secondary | ICD-10-CM | POA: Diagnosis not present

## 2020-07-09 ENCOUNTER — Other Ambulatory Visit: Payer: Self-pay

## 2020-07-09 ENCOUNTER — Ambulatory Visit (INDEPENDENT_AMBULATORY_CARE_PROVIDER_SITE_OTHER): Payer: Medicare Other | Admitting: Family Medicine

## 2020-07-09 VITALS — BP 128/70 | HR 65 | Ht 73.0 in | Wt 237.0 lb

## 2020-07-09 DIAGNOSIS — R1010 Upper abdominal pain, unspecified: Secondary | ICD-10-CM

## 2020-07-09 DIAGNOSIS — N529 Male erectile dysfunction, unspecified: Secondary | ICD-10-CM | POA: Diagnosis not present

## 2020-07-09 MED ORDER — SILDENAFIL CITRATE 100 MG PO TABS: 50.0000 mg | ORAL_TABLET | Freq: Every day | ORAL | 11 refills | Status: DC | PRN

## 2020-07-09 NOTE — Progress Notes (Signed)
Mychart message sent: Ultrasound- no gallstones.  Liver normal.  Other organs (pancreas, spleen,kidneys,etc) all normal except for incidental simple cyst of kidney.   No concerning lesions.

## 2020-07-09 NOTE — Progress Notes (Signed)
Established Patient Office Visit  Subjective:  Patient ID: Samuel [name], male    DOB: 1951-01-04  Age: 70 y.o. MRN: [id number]  CC:  Chief Complaint  Patient presents with  . Follow-up    Discuss ultrasound results    HPI Samuel [name] presents for follow-up regarding recent transient upper abdominal pain.  Refer to previous note for detail.  Has had no episodes of severe pain since then.  Appetite and weight are stable.  Ultrasound showed no gallstones or gallbladder abnormalities.  Liver normal.  Simple cyst left kidney but otherwise unremarkable ultrasound.  Labs including lipase, CBC, CMP reviewed with patient and these were normal.  He has not had any recent nausea or vomiting.  No pleuritic pain.  No chest pain.  Other issue is he states he has some erectile dysfunction.  Has not been very sexually active for 10 years.  He and his wife do have interest but has had difficulties with achieving erection and also maintaining.  No contraindications to Viagra.  No nitroglycerin use.  He does take chronic Coumadin with history of aortic valve replacement.  Non-smoker.  Non-smoker.    Past Medical History:  Diagnosis Date  . Aortic aneurysm (HCC)   . 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 Dyslipidemia   . Gout   . Hemorrhoids   . HEMORRHOIDS-INTERNAL 12/20/2009  . History of diverticulitis 03/2018  . HYPERLIPIDEMIA-MIXED 04/01/2009  . HYPOTHYROIDISM 08/26/2008  . INSOMNIA, TRANSIENT 04/27/2009  . Kidney stones   . KNEE PAIN 11/02/2008  . LATERAL EPICONDYLITIS 06/02/2009  . Migraines   . ONYCHOMYCOSIS 11/02/2008  . PERSONAL HX COLONIC POLYPS 12/20/2009  . Sudden visual loss 12/25/2008  . TRANSIENT ISCHEMIC ATTACK 01/25/2009    Past Surgical History:  Procedure Laterality Date  . CYSTECTOMY     from abdominal wall  . MYRINGOTOMY WITH TUBE PLACEMENT Left   . 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 TONSILLECTOMY      Family History  Problem Relation Age of Onset  .  Diabetes Mother   . Heart disease Father        valve problem    Social History   Socioeconomic History  . Marital status: Married    Spouse name: Not on file  . Number of children: Not on file  . Years of education: Not on file  . Highest education level: Not on file  Occupational History  . Not on file  Tobacco Use  . Smoking status: Former Games developer  . Smokeless tobacco: Never Used  Vaping Use  . Vaping Use: Never used  Substance and Sexual Activity  . Alcohol use: Yes    Alcohol/week: 14.0 standard drinks    Types: 14 Glasses of wine per week    Comment: couple glasses of wine each day  . Drug use: No  . Sexual activity: Not on file  Other Topics Concern  . Not on file  Social History Narrative  . Not on file   Social Determinants of Health   Financial Resource Strain: Low Risk   . Difficulty of Paying Living Expenses: Not hard at all  Food Insecurity: No Food Insecurity  . Worried About Programme researcher, broadcasting/film/video in the Last Year: Never true  . Ran Out of Food in the Last Year: Never true  Transportation Needs: No Transportation Needs  . Lack of Transportation (Medical): No  . Lack of Transportation (Non-Medical): No  Physical Activity: Insufficiently Active  .  Days of Exercise per Week: 3 days  . Minutes of Exercise per Session: 30 min  Stress: Stress Concern Present  . Feeling of Stress : To some extent  Social Connections: Moderately Integrated  . Frequency of Communication with Friends and Family: More than three times a week  . Frequency of Social Gatherings with Friends and Family: Three times a week  . Attends Religious Services: Never  . Active Member of Clubs or Organizations: Yes  . Attends Banker Meetings: More than 4 times per year  . Marital Status: Married  Catering manager Violence: Not At Risk  . Fear of Current or Ex-Partner: No  . Emotionally Abused: No  . Physically Abused: No  . Sexually Abused: No    Outpatient Medications  Prior to Visit  Medication Sig Dispense Refill  . allopurinol (ZYLOPRIM) 300 MG tablet TAKE 1 TABLET BY MOUTH EVERY DAY 90 tablet 3  . ketoconazole (NIZORAL) 2 % cream     . levothyroxine (SYNTHROID) 150 MCG tablet TAKE 1 TABLET BY MOUTH EVERY DAY 90 tablet 2  . oxyCODONE (OXY IR/ROXICODONE) 5 MG immediate release tablet oxycodone 5 mg tablet  Take 1 tablet twice a day by oral route as needed.    . warfarin (COUMADIN) 5 MG tablet TAKE 2 TABS DAILY EXCEPT TAKE 1 AND 1/2 TABS ON SUNDAY AS INSTRUCTED BY ANTICOAGULATION CLINIC 180 tablet 0   No facility-administered medications prior to visit.    Allergies  Allergen Reactions  . Shellfish Allergy Anaphylaxis  . Penicillins     REACTION: childhood, reaction unknown    ROS Review of Systems  Constitutional: Negative for fatigue.  Eyes: Negative for visual disturbance.  Respiratory: Negative for cough, chest tightness and shortness of breath.   Cardiovascular: Negative for chest pain, palpitations and leg swelling.  Neurological: Negative for dizziness, syncope, weakness, light-headedness and headaches.      Objective:    Physical Exam Vitals reviewed.  Constitutional:      Appearance: Normal appearance.  Cardiovascular:     Rate and Rhythm: Normal rate and regular rhythm.  Pulmonary:     Effort: Pulmonary effort is normal.     Breath sounds: Normal breath sounds.  Musculoskeletal:     Right lower leg: No edema.     Left lower leg: No edema.  Neurological:     Mental Status: He is alert.     BP 128/70 (BP Location: Left Arm, Cuff Size: Large)   Pulse 65   Ht 6\' 1"  (1.854 m)   Wt 237 lb (107.5 kg)   SpO2 96%   BMI 31.27 kg/m  Wt Readings from Last 3 Encounters:  07/09/20 237 lb (107.5 kg)  06/21/20 233 lb (105.7 kg)  12/17/19 252 lb 3.2 oz (114.4 kg)     Health Maintenance Due  Topic Date Due  . Hepatitis C Screening  Never done  . PNA vac Low Risk Adult (1 of 2 - PCV13) Never done    There are no  preventive care reminders to display for this patient.  Lab Results  Component Value Date   TSH 2.76 03/27/2019   Lab Results  Component Value Date   WBC 6.7 06/21/2020   HGB 16.1 06/21/2020   HCT 47.1 06/21/2020   MCV 91.1 06/21/2020   PLT 189.0 06/21/2020   Lab Results  Component Value Date   NA 136 06/21/2020   K 4.0 06/21/2020   CO2 30 06/21/2020   GLUCOSE 91 06/21/2020   BUN  15 06/21/2020   CREATININE 0.84 06/21/2020   BILITOT 1.2 06/21/2020   ALKPHOS 67 06/21/2020   AST 33 06/21/2020   ALT 36 06/21/2020   PROT 7.2 06/21/2020   ALBUMIN 5.0 06/21/2020   CALCIUM 9.4 06/21/2020   GFR 88.77 06/21/2020   Lab Results  Component Value Date   CHOL 160 04/28/2019   Lab Results  Component Value Date   HDL 35 (L) 04/28/2019   Lab Results  Component Value Date   LDLCALC 104 (H) 04/28/2019   Lab Results  Component Value Date   TRIG 115 04/28/2019   Lab Results  Component Value Date   CHOLHDL 4.6 04/28/2019   Lab Results  Component Value Date   HGBA1C  02/23/2009    5.8 (NOTE) The ADA recommends the following therapeutic goal for glycemic control related to Hgb A1c measurement: Goal of therapy: <6.5 Hgb A1c  Reference: American Diabetes Association: Clinical Practice Recommendations 2010, Diabetes Care, 2010, 33: (Suppl  1).      Assessment & Plan:   #1 recent transient abdominal pain.  Imaging and labs unremarkable.  Simple cyst left kidney which appears to be benign.  Reviewed recent results with patient.   -Follow-up immediately for any recurrent pain, fever, vomiting  #2 erectile dysfunction  -We discussed trial of Viagra 50 to 100 mg once daily as needed -he has no contraindications for use.   Meds ordered this encounter  Medications  . sildenafil (VIAGRA) 100 MG tablet    Sig: Take 0.5-1 tablets (50-100 mg total) by mouth daily as needed for erectile dysfunction.    Dispense:  6 tablet    Refill:  11    Follow-up: No follow-ups on file.     [name], MD

## 2020-07-18 ENCOUNTER — Other Ambulatory Visit: Payer: Self-pay | Admitting: Family Medicine

## 2020-07-19 ENCOUNTER — Other Ambulatory Visit: Payer: Self-pay

## 2020-07-19 ENCOUNTER — Ambulatory Visit (INDEPENDENT_AMBULATORY_CARE_PROVIDER_SITE_OTHER): Payer: Medicare Other | Admitting: General Practice

## 2020-07-19 DIAGNOSIS — Z7901 Long term (current) use of anticoagulants: Secondary | ICD-10-CM | POA: Diagnosis not present

## 2020-07-19 LAB — POCT INR: INR: 4.3 — AB (ref 2.0–3.0)

## 2020-07-19 NOTE — Patient Instructions (Addendum)
Pre visit review using our clinic review tool, if applicable. No additional management support is needed unless otherwise documented below in the visit note.  Hold coumadin Tuesday and Wednesday.  On Thursday take 10 mg daily except 7.5 mg on Sundays and Thursdays.  Re-check in 2 weeks.

## 2020-07-23 DIAGNOSIS — M25512 Pain in left shoulder: Secondary | ICD-10-CM | POA: Diagnosis not present

## 2020-07-23 DIAGNOSIS — Z79899 Other long term (current) drug therapy: Secondary | ICD-10-CM | POA: Diagnosis not present

## 2020-07-23 DIAGNOSIS — Z5181 Encounter for therapeutic drug level monitoring: Secondary | ICD-10-CM | POA: Diagnosis not present

## 2020-07-26 ENCOUNTER — Encounter: Payer: Self-pay | Admitting: Internal Medicine

## 2020-07-26 ENCOUNTER — Other Ambulatory Visit: Payer: Self-pay

## 2020-07-26 ENCOUNTER — Ambulatory Visit (INDEPENDENT_AMBULATORY_CARE_PROVIDER_SITE_OTHER): Payer: Medicare Other | Admitting: Internal Medicine

## 2020-07-26 VITALS — BP 134/82 | HR 76 | Ht 73.0 in | Wt 245.0 lb

## 2020-07-26 DIAGNOSIS — Z79899 Other long term (current) drug therapy: Secondary | ICD-10-CM | POA: Diagnosis not present

## 2020-07-26 DIAGNOSIS — I359 Nonrheumatic aortic valve disorder, unspecified: Secondary | ICD-10-CM

## 2020-07-26 DIAGNOSIS — Z1321 Encounter for screening for nutritional disorder: Secondary | ICD-10-CM | POA: Diagnosis not present

## 2020-07-26 DIAGNOSIS — Z952 Presence of prosthetic heart valve: Secondary | ICD-10-CM | POA: Diagnosis not present

## 2020-07-26 DIAGNOSIS — E782 Mixed hyperlipidemia: Secondary | ICD-10-CM | POA: Diagnosis not present

## 2020-07-26 NOTE — Progress Notes (Signed)
Cardiology Office Note   Date:  07/26/2020   ID:  [name], DOB 1950/06/20, MRN [id number]  PCP:  Kristian Covey, MD  Cardiologist:   Dietrich Pates, MD    Pt returns for f/u of AV prosthesis   History of Present Illness: Samuel [name] is a 70 y.o. male with a history of  AV dz  (bicuspid AV ) and asc aortic aneurysm   I saw him around 2010  He underwent AVR (ST Jude mechanical )with root replacememnt at that time I saw the pt in 2020  Echo in Dec 2020, AV prosthesis working well    Since seen in clinic  the pt says he has been breathing OK   He denies CP   No dizziness  Notes occasional heart skip    INR has been a little labile   Follows in Dr [employer] office   Current Meds  Medication Sig  . allopurinol (ZYLOPRIM) 300 MG tablet TAKE 1 TABLET BY MOUTH EVERY DAY  . ketoconazole (NIZORAL) 2 % cream   . levothyroxine (SYNTHROID) 150 MCG tablet TAKE 1 TABLET BY MOUTH EVERY DAY  . oxyCODONE (OXY IR/ROXICODONE) 5 MG immediate release tablet oxycodone 5 mg tablet  Take 1 tablet twice a day by oral route as needed.  . sildenafil (VIAGRA) 100 MG tablet Take 0.5-1 tablets (50-100 mg total) by mouth daily as needed for erectile dysfunction.  Marland Kitchen warfarin (COUMADIN) 5 MG tablet TAKE 2 TABS DAILY EXCEPT TAKE 1 AND 1/2 TABS ON SUNDAY AS INSTRUCTED BY ANTICOAGULATION CLINIC     Allergies:   Shellfish allergy and Penicillins   Past Medical History:  Diagnosis Date  . Aortic aneurysm (HCC)   . 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 Dyslipidemia   . Gout   . Hemorrhoids   . HEMORRHOIDS-INTERNAL 12/20/2009  . History of diverticulitis 03/2018  . HYPERLIPIDEMIA-MIXED 04/01/2009  . HYPOTHYROIDISM 08/26/2008  . INSOMNIA, TRANSIENT 04/27/2009  . Kidney stones   . KNEE PAIN 11/02/2008  . LATERAL EPICONDYLITIS 06/02/2009  . Migraines   . ONYCHOMYCOSIS 11/02/2008  . PERSONAL HX COLONIC POLYPS 12/20/2009  . Sudden visual loss 12/25/2008  . TRANSIENT ISCHEMIC ATTACK 01/25/2009    Past  Surgical History:  Procedure Laterality Date  . CYSTECTOMY     from abdominal wall  . MYRINGOTOMY WITH TUBE PLACEMENT Left   . 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 TONSILLECTOMY       Social History:  The patient  reports that he has quit smoking. He has never used smokeless tobacco. He reports current alcohol use of about 14.0 standard drinks of alcohol per week. He reports that he does not use drugs.   Family History:  The patient's family history includes Diabetes in his mother; Heart disease in his father.    ROS:  Please see the history of present illness. All other systems are reviewed and  Negative to the above problem except as noted.    PHYSICAL EXAM: VS:  BP 134/82   Pulse 76   Ht 6\' 1"  (1.854 m)   Wt 245 lb (111.1 kg)   SpO2 96%   BMI 32.32 kg/m   GEN: Obese 70 yo in no acute distress  HEENT: normal  Neck: no JVD, no carotid bruits Cardiac: RRR;  Crisp valve sounds best heard at apex     No LE edema  Respiratory:  clear to auscultation bilaterally, normal work of breathing GI: soft, nontender,  nondistended, + BS  No hepatomegaly  MS: no deformity Moving all extremities   Skin: warm and dry, no rash Neuro:  Strength and sensation are intact Psych: euthymic mood, full affect   EKG:  EKG is ordered today.  SR 76 bpm        Lipid Panel    Component Value Date/Time   CHOL 160 04/28/2019 1653   TRIG 115 04/28/2019 1653   HDL 35 (L) 04/28/2019 1653   CHOLHDL 4.6 04/28/2019 1653   CHOLHDL 5 11/11/2014 0923   VLDL 19.2 11/11/2014 0923   LDLCALC 104 (H) 04/28/2019 1653   LDLDIRECT 137.9 06/21/2012 0851      Wt Readings from Last 3 Encounters:  07/26/20 245 lb (111.1 kg)  07/09/20 237 lb (107.5 kg)  06/21/20 233 lb (105.7 kg)      ASSESSMENT AND PLAN:  1  AV disease Last echo in Dec 2020  Valve sounds remains crisp   Follows INR with Dr Lucie Leather office   Will check TSH today    2  Ventricular ectopy   EKG today shows ventricular  ectopy   I would lkie to set up for 72 hour monitor to check burden   He denies dizziness  3  HCM  Set up for lipids (nonfasting)  Set up for Vit D  F/U next winter    Current medicines are reviewed at length with the patient today.  The patient does not have concerns regarding medicines.  Signed, Dietrich Pates, MD  07/26/2020 4:50 PM    [facility] [address] [place], [place], [place]  [address] Phone: [phone]; Fax: [phone]

## 2020-07-26 NOTE — Patient Instructions (Signed)
Medication Instructions:  Your physician recommends that you continue on your current medications as directed. Please refer to the Current Medication list given to you today.  *If you need a refill on your cardiac medications before your next appointment, please call your pharmacy*   Lab Work: VIT D, LIPID, TSH If you have labs (blood work) drawn today and your tests are completely normal, you will receive your results only by: Marland Kitchen MyChart Message (if you have MyChart) OR . A paper copy in the mail If you have any lab test that is abnormal or we need to change your treatment, we will call you to review the results.   Testing/Procedures: NONE   Follow-Up: At [facility], you and your health needs are our priority.  As part of our continuing mission to provide you with exceptional heart care, we have created designated Provider Care Teams.  These Care Teams include your primary Cardiologist (physician) and Advanced Practice Providers (APPs -  Physician Assistants and Nurse Practitioners) who all work together to provide you with the care you need, when you need it.  We recommend signing up for the patient portal called "MyChart".  Sign up information is provided on this After Visit Summary.  MyChart is used to connect with patients for Virtual Visits (Telemedicine).  Patients are able to view lab/test results, encounter notes, upcoming appointments, etc.  Non-urgent messages can be sent to your provider as well.   To learn more about what you can do with MyChart, go to [email or web address].    Your next appointment:   11 month(s)  The format for your next appointment:   In Person  Provider:   Dietrich Pates, MD   Other Instructions

## 2020-07-27 ENCOUNTER — Other Ambulatory Visit: Payer: Self-pay | Admitting: Internal Medicine

## 2020-07-27 ENCOUNTER — Ambulatory Visit: Payer: Medicare Other

## 2020-07-27 DIAGNOSIS — Z952 Presence of prosthetic heart valve: Secondary | ICD-10-CM

## 2020-07-27 DIAGNOSIS — I493 Ventricular premature depolarization: Secondary | ICD-10-CM

## 2020-07-27 DIAGNOSIS — I359 Nonrheumatic aortic valve disorder, unspecified: Secondary | ICD-10-CM

## 2020-07-27 DIAGNOSIS — I459 Conduction disorder, unspecified: Secondary | ICD-10-CM

## 2020-07-27 DIAGNOSIS — R002 Palpitations: Secondary | ICD-10-CM

## 2020-07-27 LAB — LIPID PANEL
Chol/HDL Ratio: 3.9 ratio (ref 0.0–5.0)
Cholesterol, Total: 171 mg/dL (ref 100–199)
HDL: 44 mg/dL (ref >39)
LDL Chol Calc (NIH): 88 mg/dL (ref 0–99)
Triglycerides: 232 mg/dL — ABNORMAL HIGH (ref 0–149)
VLDL Cholesterol Cal: 39 mg/dL (ref 5–40)

## 2020-07-27 LAB — TSH: TSH: 0.286 u[IU]/mL — ABNORMAL LOW (ref 0.450–4.500)

## 2020-07-27 LAB — VITAMIN D 25 HYDROXY (VIT D DEFICIENCY, FRACTURES): Vit D, 25-Hydroxy: 14.8 ng/mL — ABNORMAL LOW (ref 30.0–100.0)

## 2020-08-05 ENCOUNTER — Other Ambulatory Visit: Payer: Self-pay | Admitting: Family Medicine

## 2020-08-16 ENCOUNTER — Other Ambulatory Visit: Payer: Self-pay

## 2020-08-16 ENCOUNTER — Ambulatory Visit (INDEPENDENT_AMBULATORY_CARE_PROVIDER_SITE_OTHER): Payer: Medicare Other | Admitting: General Practice

## 2020-08-16 DIAGNOSIS — Z7901 Long term (current) use of anticoagulants: Secondary | ICD-10-CM

## 2020-08-16 LAB — POCT INR: INR: 2.3 (ref 2.0–3.0)

## 2020-08-16 NOTE — Patient Instructions (Signed)
Pre visit review using our clinic review tool, if applicable. No additional management support is needed unless otherwise documented below in the visit note. Continue to take 10 mg daily except 7.5 mg on Sundays and Thursdays.  Re-check in 6 weeks.

## 2020-08-17 ENCOUNTER — Ambulatory Visit (INDEPENDENT_AMBULATORY_CARE_PROVIDER_SITE_OTHER): Payer: Medicare Other | Admitting: Family Medicine

## 2020-08-17 ENCOUNTER — Encounter: Payer: Self-pay | Admitting: Family Medicine

## 2020-08-17 VITALS — BP 130/80 | HR 73 | Temp 98.1°F | Ht 73.0 in | Wt 242.4 lb

## 2020-08-17 DIAGNOSIS — N529 Male erectile dysfunction, unspecified: Secondary | ICD-10-CM | POA: Diagnosis not present

## 2020-08-17 DIAGNOSIS — L84 Corns and callosities: Secondary | ICD-10-CM | POA: Diagnosis not present

## 2020-08-17 MED ORDER — SILDENAFIL CITRATE 100 MG PO TABS: 50.0000 mg | ORAL_TABLET | Freq: Every day | ORAL | 5 refills | Status: DC | PRN

## 2020-08-17 NOTE — Progress Notes (Signed)
Established Patient Office Visit  Subjective:  Patient ID: Samuel [name], male    DOB: 1950-12-22  Age: 70 y.o. MRN: [id number]  CC:  Chief Complaint  Patient presents with  . Rash  . patient complains of painful plantar wart LLE xyears    HPI  ASIF MUCHOW presents for left foot pain on the plantar aspect of the left foot laterally.  He was concerned this may be a plantar wart.  He states he was treated at age 68 with plantar wart and has had some discomfort intermittently since then.  This has recently impaired his walking.  Pain with pressure.  He has seen podiatrist intermittently in the past.  He tried some topical salicylic acid patches which made this hurt even more.  He has history of erectile dysfunction.  He recently requested trial of Viagra.  He had some flushing but this was not intolerable.  Otherwise no side effects.  He did have improved quality and duration of erection and would like refills.  Past Medical History:  Diagnosis Date  . Aortic aneurysm (HCC)   . 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 Dyslipidemia   . Gout   . Hemorrhoids   . HEMORRHOIDS-INTERNAL 12/20/2009  . History of diverticulitis 03/2018  . HYPERLIPIDEMIA-MIXED 04/01/2009  . HYPOTHYROIDISM 08/26/2008  . INSOMNIA, TRANSIENT 04/27/2009  . Kidney stones   . KNEE PAIN 11/02/2008  . LATERAL EPICONDYLITIS 06/02/2009  . Migraines   . ONYCHOMYCOSIS 11/02/2008  . PERSONAL HX COLONIC POLYPS 12/20/2009  . Sudden visual loss 12/25/2008  . TRANSIENT ISCHEMIC ATTACK 01/25/2009    Past Surgical History:  Procedure Laterality Date  . CYSTECTOMY     from abdominal wall  . MYRINGOTOMY WITH TUBE PLACEMENT Left   . 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 TONSILLECTOMY      Family History  Problem Relation Age of Onset  . Diabetes Mother   . Heart disease Father        valve problem    Social History   Socioeconomic History  . Marital status: Married    Spouse name: Not on file  . Number  of children: Not on file  . Years of education: Not on file  . Highest education level: Not on file  Occupational History  . Not on file  Tobacco Use  . Smoking status: Former Games developer  . Smokeless tobacco: Never Used  Vaping Use  . Vaping Use: Never used  Substance and Sexual Activity  . Alcohol use: Yes    Alcohol/week: 14.0 standard drinks    Types: 14 Glasses of wine per week    Comment: couple glasses of wine each day  . Drug use: No  . Sexual activity: Not on file  Other Topics Concern  . Not on file  Social History Narrative  . Not on file   Social Determinants of Health   Financial Resource Strain: Low Risk   . Difficulty of Paying Living Expenses: Not hard at all  Food Insecurity: No Food Insecurity  . Worried About Programme researcher, broadcasting/film/video in the Last Year: Never true  . Ran Out of Food in the Last Year: Never true  Transportation Needs: No Transportation Needs  . Lack of Transportation (Medical): No  . Lack of Transportation (Non-Medical): No  Physical Activity: Insufficiently Active  . Days of Exercise per Week: 3 days  . Minutes of Exercise per Session: 30 min  Stress: Stress Concern Present  .  Feeling of Stress : To some extent  Social Connections: Moderately Integrated  . Frequency of Communication with Friends and Family: More than three times a week  . Frequency of Social Gatherings with Friends and Family: Three times a week  . Attends Religious Services: Never  . Active Member of Clubs or Organizations: Yes  . Attends Banker Meetings: More than 4 times per year  . Marital Status: Married  Catering manager Violence: Not At Risk  . Fear of Current or Ex-Partner: No  . Emotionally Abused: No  . Physically Abused: No  . Sexually Abused: No    Outpatient Medications Prior to Visit  Medication Sig Dispense Refill  . allopurinol (ZYLOPRIM) 300 MG tablet TAKE 1 TABLET BY MOUTH EVERY DAY 90 tablet 3  . levothyroxine (SYNTHROID) 150 MCG tablet  TAKE 1 TABLET BY MOUTH EVERY DAY 90 tablet 1  . oxyCODONE (OXY IR/ROXICODONE) 5 MG immediate release tablet oxycodone 5 mg tablet  Take 1 tablet twice a day by oral route as needed.    . warfarin (COUMADIN) 5 MG tablet TAKE 2 TABS DAILY EXCEPT TAKE 1 AND 1/2 TABS ON SUNDAY AS INSTRUCTED BY ANTICOAGULATION CLINIC 180 tablet 0  . sildenafil (VIAGRA) 100 MG tablet Take 0.5-1 tablets (50-100 mg total) by mouth daily as needed for erectile dysfunction. 6 tablet 11  . ketoconazole (NIZORAL) 2 % cream      No facility-administered medications prior to visit.    Allergies  Allergen Reactions  . Shellfish Allergy Anaphylaxis  . Penicillins     REACTION: childhood, reaction unknown    ROS Review of Systems  Constitutional: Negative for chills and fever.      Objective:    Physical Exam Vitals reviewed.  Constitutional:      Appearance: Normal appearance.  Cardiovascular:     Rate and Rhythm: Normal rate and regular rhythm.  Skin:    Comments: Left foot reveals area of callus on the plantar aspect laterally.  There is no evidence for plantar wart.  Generally dry skin on both feet.  No ulcerations  Neurological:     Mental Status: He is alert.     BP 130/80 (BP Location: Left Arm, Patient Position: Sitting, Cuff Size: Large)   Pulse 73   Temp 98.1 F (36.7 C) (Oral)   Ht 6\' 1"  (1.854 m)   Wt 242 lb 6.4 oz (110 kg)   SpO2 94%   BMI 31.98 kg/m  Wt Readings from Last 3 Encounters:  08/17/20 242 lb 6.4 oz (110 kg)  07/26/20 245 lb (111.1 kg)  07/09/20 237 lb (107.5 kg)     Health Maintenance Due  Topic Date Due  . Hepatitis C Screening  Never done  . PNA vac Low Risk Adult (1 of 2 - PCV13) Never done    There are no preventive care reminders to display for this patient.  Lab Results  Component Value Date   TSH 0.286 (L) 07/26/2020   Lab Results  Component Value Date   WBC 6.7 06/21/2020   HGB 16.1 06/21/2020   HCT 47.1 06/21/2020   MCV 91.1 06/21/2020   PLT  189.0 06/21/2020   Lab Results  Component Value Date   NA 136 06/21/2020   K 4.0 06/21/2020   CO2 30 06/21/2020   GLUCOSE 91 06/21/2020   BUN 15 06/21/2020   CREATININE 0.84 06/21/2020   BILITOT 1.2 06/21/2020   ALKPHOS 67 06/21/2020   AST 33 06/21/2020   ALT 36  06/21/2020   PROT 7.2 06/21/2020   ALBUMIN 5.0 06/21/2020   CALCIUM 9.4 06/21/2020   GFR 88.77 06/21/2020   Lab Results  Component Value Date   CHOL 171 07/26/2020   Lab Results  Component Value Date   HDL 44 07/26/2020   Lab Results  Component Value Date   LDLCALC 88 07/26/2020   Lab Results  Component Value Date   TRIG 232 (H) 07/26/2020   Lab Results  Component Value Date   CHOLHDL 3.9 07/26/2020   Lab Results  Component Value Date   HGBA1C  02/23/2009    5.8 (NOTE) The ADA recommends the following therapeutic goal for glycemic control related to Hgb A1c measurement: Goal of therapy: <6.5 Hgb A1c  Reference: American Diabetes Association: Clinical Practice Recommendations 2010, Diabetes Care, 2010, 33: (Suppl  1).      Assessment & Plan:   #1 callus on the distal lateral aspect of the left foot on the plantar surface.  No evidence for plantar wart.  This is very painful.  -We offered trimming with #15 blade and reviewed risks including low risk of bleeding and he consented.  Using #15 blade we gently pared down the hard callused area.  He did have pain relief afterwards. -He is aware callus will likely recur with time.  He will consider follow-up with podiatrist to discuss possible preventative measures.  #2 erectile dysfunction.  Improved with Viagra. -Patient requesting refills of Viagra and this will be sent.  Meds ordered this encounter  Medications  . sildenafil (VIAGRA) 100 MG tablet    Sig: Take 0.5-1 tablets (50-100 mg total) by mouth daily as needed for erectile dysfunction.    Dispense:  30 tablet    Refill:  5    Follow-up: No follow-ups on file.    [name], MD

## 2020-08-17 NOTE — Patient Instructions (Signed)
Corns and Calluses Corns are small areas of thickened skin that form on the top, sides, or tip of a toe. Corns have a cone-shaped core with a point that can press on a nerve below. This causes pain. Calluses are areas of thickened skin that can form anywhere on the body, including the hands, fingers, palms, soles of the feet, and heels. Calluses are usually larger than corns. What are the causes? Corns and calluses are caused by rubbing (friction) or pressure, such as from shoes that are too tight or do not fit properly. What increases the risk? Corns are more likely to develop in people who have misshapen toes (toe deformities), such as hammer toes. Calluses can form with friction to any area of the skin. They are more likely to develop in people who:  Work with their hands.  Wear shoes that fit poorly, are too tight, or are high-heeled.  Have toe deformities. What are the signs or symptoms? Symptoms of a corn or callus include:  A hard growth on the skin.  Pain or tenderness under the skin.  Redness and swelling.  Increased discomfort while wearing tight-fitting shoes, if your feet are affected. If a corn or callus becomes infected, symptoms may include:  Redness and swelling that gets worse.  Pain.  Fluid, blood, or pus draining from the corn or callus.   How is this diagnosed? Corns and calluses may be diagnosed based on your symptoms, your medical history, and a physical exam. How is this treated? Treatment for corns and calluses may include:  Removing the cause of the friction or pressure. This may involve: ? Changing your shoes. ? Wearing shoe inserts (orthotics) or other protective layers in your shoes, such as a corn pad. ? Wearing gloves.  Applying medicine to the skin (topical medicine) to help soften skin in the hardened, thickened areas.  Removing layers of dead skin with a file to reduce the size of the corn or callus.  Removing the corn or callus with a  scalpel or laser.  Taking antibiotic medicines, if your corn or callus is infected.  Having surgery, if a toe deformity is the cause. Follow these instructions at home:  Take over-the-counter and prescription medicines only as told by your health care provider.  If you were prescribed an antibiotic medicine, take it as told by your health care provider. Do not stop taking it even if your condition improves.  Wear shoes that fit well. Avoid wearing high-heeled shoes and shoes that are too tight or too loose.  Wear any padding, protective layers, gloves, or orthotics as told by your health care provider.  Soak your hands or feet. Then use a file or pumice stone to soften your corn or callus. Do this as told by your health care provider.  Check your corn or callus every day for signs of infection.   Contact a health care provider if:  Your symptoms do not improve with treatment.  You have redness or swelling that gets worse.  Your corn or callus becomes painful.  You have fluid, blood, or pus coming from your corn or callus.  You have new symptoms. Get help right away if:  You develop severe pain with redness. Summary  Corns are small areas of thickened skin that form on the top, sides, or tip of a toe. These can be painful.  Calluses are areas of thickened skin that can form anywhere on the body, including the hands, fingers, palms, and soles of the   feet. Calluses are usually larger than corns.  Corns and calluses are caused by rubbing (friction) or pressure, such as from shoes that are too tight or do not fit properly.  Treatment may include wearing padding, protective layers, gloves, or orthotics as told by your health care provider. This information is not intended to replace advice given to you by your health care provider. Make sure you discuss any questions you have with your health care provider. Document Revised: 09/25/2019 Document Reviewed: 09/25/2019 Elsevier  Patient Education  2021 Elsevier Inc.  

## 2020-08-17 NOTE — Progress Notes (Signed)
0

## 2020-08-24 ENCOUNTER — Other Ambulatory Visit: Payer: Self-pay | Admitting: *Deleted

## 2020-08-24 MED ORDER — CHOLECALCIFEROL 50 MCG (2000 UT) PO TBDP: 2.0000 | ORAL_TABLET | Freq: Every day | ORAL | Status: DC

## 2020-08-25 ENCOUNTER — Other Ambulatory Visit: Payer: Self-pay

## 2020-08-25 DIAGNOSIS — M19012 Primary osteoarthritis, left shoulder: Secondary | ICD-10-CM | POA: Diagnosis not present

## 2020-08-25 DIAGNOSIS — E039 Hypothyroidism, unspecified: Secondary | ICD-10-CM

## 2020-08-25 MED ORDER — LEVOTHYROXINE SODIUM 137 MCG PO TABS: 137.0000 ug | ORAL_TABLET | Freq: Every day | ORAL | 2 refills | Status: DC

## 2020-08-31 ENCOUNTER — Other Ambulatory Visit: Payer: Self-pay | Admitting: Family Medicine

## 2020-08-31 DIAGNOSIS — Z7901 Long term (current) use of anticoagulants: Secondary | ICD-10-CM

## 2020-09-27 ENCOUNTER — Ambulatory Visit: Payer: Medicare Other

## 2020-09-29 ENCOUNTER — Ambulatory Visit (INDEPENDENT_AMBULATORY_CARE_PROVIDER_SITE_OTHER): Payer: Medicare Other | Admitting: Family Medicine

## 2020-09-29 ENCOUNTER — Other Ambulatory Visit: Payer: Self-pay

## 2020-09-29 ENCOUNTER — Encounter: Payer: Self-pay | Admitting: Family Medicine

## 2020-09-29 VITALS — BP 120/70 | HR 77 | Temp 98.3°F | Wt 234.3 lb

## 2020-09-29 DIAGNOSIS — Z79899 Other long term (current) drug therapy: Secondary | ICD-10-CM | POA: Diagnosis not present

## 2020-09-29 DIAGNOSIS — B351 Tinea unguium: Secondary | ICD-10-CM

## 2020-09-29 DIAGNOSIS — B369 Superficial mycosis, unspecified: Secondary | ICD-10-CM | POA: Diagnosis not present

## 2020-09-29 MED ORDER — TERBINAFINE HCL 250 MG PO TABS: 250.0000 mg | ORAL_TABLET | Freq: Every day | ORAL | 0 refills | Status: DC

## 2020-09-29 MED ORDER — CICLOPIROX OLAMINE 0.77 % EX CREA: TOPICAL_CREAM | Freq: Two times a day (BID) | CUTANEOUS | 1 refills | Status: DC

## 2020-09-29 NOTE — Patient Instructions (Signed)
Fungal Nail Infection A fungal nail infection is a common infection of the toenails or fingernails. This condition affects toenails more often than fingernails. It often affects the great, or big, toes. More than one nail may be infected. The condition can be passed from person to person (is contagious). What are the causes? This condition is caused by a fungus. Several types of fungi can cause the infection. These fungi are common in moist and warm areas. If your hands or feet come into contact with the fungus, it may get into a crack in your fingernail or toenail and cause the infection. What increases the risk? The following factors may make you more likely to develop this condition:  Being male.  Being of older age.  Living with someone who has the fungus.  Walking barefoot in areas where the fungus thrives, such as showers or locker rooms.  Wearing shoes and socks that cause your feet to sweat.  Having a nail injury or a recent nail surgery.  Having certain medical conditions, such as: ? Athlete's foot. ? Diabetes. ? Psoriasis. ? Poor circulation. ? A weak body defense system (immune system). What are the signs or symptoms? Symptoms of this condition include:  A pale spot on the nail.  Thickening of the nail.  A nail that becomes yellow or brown.  A brittle or ragged nail edge.  A crumbling nail.  A nail that has lifted away from the nail bed.   How is this diagnosed? This condition is diagnosed with a physical exam. Your health care provider may take a scraping or clipping from your nail to test for the fungus. How is this treated? Treatment is not needed for mild infections. If you have significant nail changes, treatment may include:  Antifungal medicines taken by mouth (orally). You may need to take the medicine for several weeks or several months, and you may not see the results for a long time. These medicines can cause side effects. Ask your health care  provider what problems to watch for.  Antifungal nail polish or nail cream. These may be used along with oral antifungal medicines.  Laser treatment of the nail.  Surgery to remove the nail. This may be needed for the most severe infections. It can take a long time, usually up to a year, for the infection to go away. The infection may also come back.   Follow these instructions at home: Medicines  Take or apply over-the-counter and prescription medicines only as told by your health care provider.  Ask your health care provider about using over-the-counter mentholated ointment on your nails. Nail care  Trim your nails often.  Wash and dry your hands and feet every day.  Keep your feet dry: ? Wear absorbent socks, and change your socks frequently. ? Wear shoes that allow air to circulate, such as sandals or canvas tennis shoes. Throw out old shoes.  Do not use artificial nails.  If you go to a nail salon, make sure you choose one that uses clean instruments.  Use antifungal foot powder on your feet and in your shoes. General instructions  Do not share personal items, such as towels or nail clippers.  Do not walk barefoot in shower rooms or locker rooms.  Wear rubber gloves if you are working with your hands in wet areas.  Keep all follow-up visits as told by your health care provider. This is important. Contact a health care provider if: Your infection is not getting better or   it is getting worse after several months. Summary  A fungal nail infection is a common infection of the toenails or fingernails.  Treatment is not needed for mild infections. If you have significant nail changes, treatment may include taking medicine orally and applying medicine to your nails.  It can take a long time, usually up to a year, for the infection to go away. The infection may also come back.  Take or apply over-the-counter and prescription medicines only as told by your health care  provider.  Follow instructions for taking care of your nails to help prevent infection from coming back or spreading. This information is not intended to replace advice given to you by your health care provider. Make sure you discuss any questions you have with your health care provider. Document Revised: 09/19/2018 Document Reviewed: 11/02/2017 Elsevier Patient Education  2021 Elsevier Inc.  

## 2020-09-29 NOTE — Progress Notes (Signed)
Established Patient Office Visit  Subjective:  Patient ID: Samuel [name], male    DOB: 10-Jul-1950  Age: 70 y.o. MRN: [id number]  CC:  Chief Complaint  Patient presents with  . Nail Problem    Reoccurring issue    HPI Samuel [name] presents for bilateral foot rash and thickened dysmorphic toenails involving several toenails of both feet.  He states he has upcoming trip to [place] in 2 months and wants to try to get his rash cleared up before then.  He had some leftover Loprox which she started a couple days ago and that seems to be helping his skin rash.  He has slightly pruritic scaly rash on both feet but also has thickened nail changes and would like to consider addressing that as well.  He has had history of mildly elevated liver transaminases in the past but these were normal last time they were checked.  No history of diabetes.  Past Medical History:  Diagnosis Date  . Aortic aneurysm (HCC)   . 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 Dyslipidemia   . Gout   . Hemorrhoids   . HEMORRHOIDS-INTERNAL 12/20/2009  . History of diverticulitis 03/2018  . HYPERLIPIDEMIA-MIXED 04/01/2009  . HYPOTHYROIDISM 08/26/2008  . INSOMNIA, TRANSIENT 04/27/2009  . Kidney stones   . KNEE PAIN 11/02/2008  . LATERAL EPICONDYLITIS 06/02/2009  . Migraines   . ONYCHOMYCOSIS 11/02/2008  . PERSONAL HX COLONIC POLYPS 12/20/2009  . Sudden visual loss 12/25/2008  . TRANSIENT ISCHEMIC ATTACK 01/25/2009    Past Surgical History:  Procedure Laterality Date  . CYSTECTOMY     from abdominal wall  . MYRINGOTOMY WITH TUBE PLACEMENT Left   . 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 TONSILLECTOMY      Family History  Problem Relation Age of Onset  . Diabetes Mother   . Heart disease Father        valve problem    Social History   Socioeconomic History  . Marital status: Married    Spouse name: Not on file  . Number of children: Not on file  . Years of education: Not on file  . Highest education  level: Not on file  Occupational History  . Not on file  Tobacco Use  . Smoking status: Former Games developer  . Smokeless tobacco: Never Used  Vaping Use  . Vaping Use: Never used  Substance and Sexual Activity  . Alcohol use: Yes    Alcohol/week: 14.0 standard drinks    Types: 14 Glasses of wine per week    Comment: couple glasses of wine each day  . Drug use: No  . Sexual activity: Not on file  Other Topics Concern  . Not on file  Social History Narrative  . Not on file   Social Determinants of Health   Financial Resource Strain: Low Risk   . Difficulty of Paying Living Expenses: Not hard at all  Food Insecurity: No Food Insecurity  . Worried About Programme researcher, broadcasting/film/video in the Last Year: Never true  . Ran Out of Food in the Last Year: Never true  Transportation Needs: No Transportation Needs  . Lack of Transportation (Medical): No  . Lack of Transportation (Non-Medical): No  Physical Activity: Insufficiently Active  . Days of Exercise per Week: 3 days  . Minutes of Exercise per Session: 30 min  Stress: Stress Concern Present  . Feeling of Stress : To some extent  Social Connections: Moderately Integrated  .  Frequency of Communication with Friends and Family: More than three times a week  . Frequency of Social Gatherings with Friends and Family: Three times a week  . Attends Religious Services: Never  . Active Member of Clubs or Organizations: Yes  . Attends Banker Meetings: More than 4 times per year  . Marital Status: Married  Catering manager Violence: Not At Risk  . Fear of Current or Ex-Partner: No  . Emotionally Abused: No  . Physically Abused: No  . Sexually Abused: No    Outpatient Medications Prior to Visit  Medication Sig Dispense Refill  . allopurinol (ZYLOPRIM) 300 MG tablet TAKE 1 TABLET BY MOUTH EVERY DAY 90 tablet 3  . Cholecalciferol 50 MCG (2000 UT) TBDP Take 2 tablets by mouth daily. 30 tablet   . levothyroxine (SYNTHROID) 137 MCG tablet  Take 1 tablet (137 mcg total) by mouth daily before breakfast. 30 tablet 2  . oxyCODONE (OXY IR/ROXICODONE) 5 MG immediate release tablet oxycodone 5 mg tablet  Take 1 tablet twice a day by oral route as needed.    . sildenafil (VIAGRA) 100 MG tablet Take 0.5-1 tablets (50-100 mg total) by mouth daily as needed for erectile dysfunction. 30 tablet 5  . warfarin (COUMADIN) 5 MG tablet TAKE 2 TABS DAILY EXCEPT TAKE 1 AND 1/2 TABS ON SUNDAY AS INSTRUCTED BY ANTICOAGULATION CLINIC 180 tablet 0   No facility-administered medications prior to visit.    Allergies  Allergen Reactions  . Shellfish Allergy Anaphylaxis  . Penicillins     REACTION: childhood, reaction unknown    ROS Review of Systems  Constitutional: Negative for chills and fever.  Skin: Positive for rash.      Objective:    Physical Exam Vitals reviewed.  Constitutional:      Appearance: Normal appearance.  Cardiovascular:     Rate and Rhythm: Normal rate and regular rhythm.  Pulmonary:     Effort: Pulmonary effort is normal.     Breath sounds: Normal breath sounds.  Skin:    Comments: Fairly diffuse rash on both feet.  This does involve interdigital webspace between the fourth and fifth toes but also involving the medial and plantar aspect of both feet and extending up to the sides laterally and medially.  No pustules.  No vesicles.  Also has several thickened dysmorphic nails on both feet  Neurological:     Mental Status: He is alert.     BP 120/70 (BP Location: Left Arm, Patient Position: Sitting, Cuff Size: Normal)   Pulse 77   Temp 98.3 F (36.8 C) (Oral)   Wt 234 lb 4.8 oz (106.3 kg)   SpO2 98%   BMI 30.91 kg/m  Wt Readings from Last 3 Encounters:  09/29/20 234 lb 4.8 oz (106.3 kg)  08/17/20 242 lb 6.4 oz (110 kg)  07/26/20 245 lb (111.1 kg)     Health Maintenance Due  Topic Date Due  . Hepatitis C Screening  Never done  . PNA vac Low Risk Adult (1 of 2 - PCV13) Never done    There are no  preventive care reminders to display for this patient.  Lab Results  Component Value Date   TSH 0.286 (L) 07/26/2020   Lab Results  Component Value Date   WBC 6.7 06/21/2020   HGB 16.1 06/21/2020   HCT 47.1 06/21/2020   MCV 91.1 06/21/2020   PLT 189.0 06/21/2020   Lab Results  Component Value Date   NA 136 06/21/2020  K 4.0 06/21/2020   CO2 30 06/21/2020   GLUCOSE 91 06/21/2020   BUN 15 06/21/2020   CREATININE 0.84 06/21/2020   BILITOT 1.2 06/21/2020   ALKPHOS 67 06/21/2020   AST 33 06/21/2020   ALT 36 06/21/2020   PROT 7.2 06/21/2020   ALBUMIN 5.0 06/21/2020   CALCIUM 9.4 06/21/2020   GFR 88.77 06/21/2020   Lab Results  Component Value Date   CHOL 171 07/26/2020   Lab Results  Component Value Date   HDL 44 07/26/2020   Lab Results  Component Value Date   LDLCALC 88 07/26/2020   Lab Results  Component Value Date   TRIG 232 (H) 07/26/2020   Lab Results  Component Value Date   CHOLHDL 3.9 07/26/2020   Lab Results  Component Value Date   HGBA1C  02/23/2009    5.8 (NOTE) The ADA recommends the following therapeutic goal for glycemic control related to Hgb A1c measurement: Goal of therapy: <6.5 Hgb A1c  Reference: American Diabetes Association: Clinical Practice Recommendations 2010, Diabetes Care, 2010, 33: (Suppl  1).      Assessment & Plan:   #1 probable fungal rash involving both feet.  He had some leftover Loprox and is already improving with 3 days of that -Keep feet dry -Refilled Loprox to use twice daily as needed  #2 dysmorphic toenails.  Suspect onychomycosis. -Check hepatic panel.  If normal, start Lamisil 250 mg once daily for 3 months  Meds ordered this encounter  Medications  . ciclopirox (LOPROX) 0.77 % cream    Sig: Apply topically 2 (two) times daily.    Dispense:  30 g    Refill:  1  . terbinafine (LAMISIL) 250 MG tablet    Sig: Take 1 tablet (250 mg total) by mouth daily.    Dispense:  90 tablet    Refill:  0     Follow-up: No follow-ups on file.    [name], MD

## 2020-10-01 ENCOUNTER — Other Ambulatory Visit: Payer: Self-pay

## 2020-10-01 ENCOUNTER — Other Ambulatory Visit (INDEPENDENT_AMBULATORY_CARE_PROVIDER_SITE_OTHER): Payer: Medicare Other

## 2020-10-01 DIAGNOSIS — E039 Hypothyroidism, unspecified: Secondary | ICD-10-CM

## 2020-10-01 DIAGNOSIS — Z79899 Other long term (current) drug therapy: Secondary | ICD-10-CM | POA: Diagnosis not present

## 2020-10-01 LAB — HEPATIC FUNCTION PANEL
ALT: 40 U/L (ref 0–53)
AST: 34 U/L (ref 0–37)
Albumin: 4.7 g/dL (ref 3.5–5.2)
Alkaline Phosphatase: 62 U/L (ref 39–117)
Bilirubin, Direct: 0.2 mg/dL (ref 0.0–0.3)
Total Bilirubin: 1.1 mg/dL (ref 0.2–1.2)
Total Protein: 7 g/dL (ref 6.0–8.3)

## 2020-10-01 LAB — T4, FREE: Free T4: 1.18 ng/dL (ref 0.60–1.60)

## 2020-10-01 LAB — TSH: TSH: 1.85 u[IU]/mL (ref 0.35–4.50)

## 2020-10-04 ENCOUNTER — Telehealth: Payer: Self-pay | Admitting: Family Medicine

## 2020-10-04 ENCOUNTER — Ambulatory Visit (INDEPENDENT_AMBULATORY_CARE_PROVIDER_SITE_OTHER): Payer: Medicare Other | Admitting: General Practice

## 2020-10-04 ENCOUNTER — Other Ambulatory Visit: Payer: Self-pay

## 2020-10-04 DIAGNOSIS — Z7901 Long term (current) use of anticoagulants: Secondary | ICD-10-CM

## 2020-10-04 LAB — POCT INR: INR: 1.3 — AB (ref 2.0–3.0)

## 2020-10-04 NOTE — Telephone Encounter (Signed)
Pt is wanting results of his lab work he had done last week.

## 2020-10-04 NOTE — Patient Instructions (Signed)
Pre visit review using our clinic review tool, if applicable. No additional management support is needed unless otherwise documented below in the visit note.  Take 3 tablets today, tomorrow and Wednesday and then start taking 2 tablets daily except 2 1/2 tablets on Sundays.  Re-check in 1 week.

## 2020-10-04 NOTE — Telephone Encounter (Signed)
Left message for patient to call back  

## 2020-10-05 DIAGNOSIS — B351 Tinea unguium: Secondary | ICD-10-CM | POA: Diagnosis not present

## 2020-10-05 DIAGNOSIS — M79676 Pain in unspecified toe(s): Secondary | ICD-10-CM | POA: Diagnosis not present

## 2020-10-05 NOTE — Telephone Encounter (Signed)
Spoke with the patient, he is aware of Dr. Burchette's message. 

## 2020-10-05 NOTE — Telephone Encounter (Signed)
Discussed results with patient, patient expressed understanding. Patient would like to know if he should stop drinking while on this medication. He is going on vacation soon.

## 2020-10-05 NOTE — Telephone Encounter (Signed)
Can drink some but limit to 2 drinks per day.

## 2020-10-11 ENCOUNTER — Ambulatory Visit: Payer: Medicare Other

## 2020-10-18 ENCOUNTER — Ambulatory Visit (INDEPENDENT_AMBULATORY_CARE_PROVIDER_SITE_OTHER): Payer: Medicare Other | Admitting: General Practice

## 2020-10-18 ENCOUNTER — Other Ambulatory Visit: Payer: Self-pay

## 2020-10-18 DIAGNOSIS — Z7901 Long term (current) use of anticoagulants: Secondary | ICD-10-CM | POA: Diagnosis not present

## 2020-10-18 LAB — POCT INR: INR: 1.4 — AB (ref 2.0–3.0)

## 2020-10-18 NOTE — Patient Instructions (Addendum)
Pre visit review using our clinic review tool, if applicable. No additional management support is needed unless otherwise documented below in the visit note.  Take 4 tablets today take 3 tablets tomorrow (5/10) and then change dosage and take 2 tablets daily except 3 tablets on Mon Wed and Friday.  Re-check in 1 week.

## 2020-10-25 ENCOUNTER — Other Ambulatory Visit: Payer: Self-pay

## 2020-10-25 ENCOUNTER — Other Ambulatory Visit: Payer: Medicare Other

## 2020-10-25 ENCOUNTER — Ambulatory Visit (INDEPENDENT_AMBULATORY_CARE_PROVIDER_SITE_OTHER): Payer: Medicare Other | Admitting: General Practice

## 2020-10-25 DIAGNOSIS — Z7901 Long term (current) use of anticoagulants: Secondary | ICD-10-CM | POA: Diagnosis not present

## 2020-10-25 LAB — POCT INR: INR: 1.6 — AB (ref 2.0–3.0)

## 2020-10-25 NOTE — Patient Instructions (Addendum)
Pre visit review using our clinic review tool, if applicable. No additional management support is needed unless otherwise documented below in the visit note.  Change dosage and take 3 tablets daily.  Re-check in 2 weeks.  **Patient has adopted the Atkins diet which is high in vitamin K foods. Patient states " I have lost 32 lbs and will continue this way of eating."  Patient's dosage of coumadin has been increased to accommodate this increase of vitamin K.  RN reiterated that INR will need to be re-checked more often and the importance of being consistent with this diet.  RN informed patient that a decrease in this type of diet will result in a supra-therapeutic INR.  RN asked patient to report any unusual bleeding or bruising.  Patient verbalized understanding.

## 2020-11-02 DIAGNOSIS — M79676 Pain in unspecified toe(s): Secondary | ICD-10-CM | POA: Diagnosis not present

## 2020-11-02 DIAGNOSIS — B351 Tinea unguium: Secondary | ICD-10-CM | POA: Diagnosis not present

## 2020-11-10 ENCOUNTER — Other Ambulatory Visit: Payer: Self-pay

## 2020-11-10 ENCOUNTER — Telehealth: Payer: Self-pay | Admitting: Family Medicine

## 2020-11-10 ENCOUNTER — Ambulatory Visit (INDEPENDENT_AMBULATORY_CARE_PROVIDER_SITE_OTHER): Payer: Medicare Other | Admitting: General Practice

## 2020-11-10 DIAGNOSIS — Z7901 Long term (current) use of anticoagulants: Secondary | ICD-10-CM

## 2020-11-10 LAB — POCT INR: INR: 3.1 — AB (ref 2.0–3.0)

## 2020-11-10 NOTE — Patient Instructions (Addendum)
Pre visit review using our clinic review tool, if applicable. No additional management support is needed unless otherwise documented below in the visit note.  Continue to take 3 tablets daily.  Re-check in 2 weeks.

## 2020-11-10 NOTE — Chronic Care Management (AMB) (Signed)
  Chronic Care Management   Note  11/10/2020 Name: FILIP LUTEN MRN: [id number] DOB: 07-24-1950  ZAIDYN CLAIRE is a 70 y.o. year old male who is a primary care patient of Burchette, Elberta Fortis, MD. I reached out to [name] by phone today in response to a referral sent by Mr. Lorelle Gibbs Riga's PCP, Kristian Covey, MD.   Mr. Tugwell was given information about Chronic Care Management services today including:  1. CCM service includes personalized support from designated clinical staff supervised by his physician, including individualized plan of care and coordination with other care providers 2. 24/7 contact phone numbers for assistance for urgent and routine care needs. 3. Service will only be billed when office clinical staff spend 20 minutes or more in a month to coordinate care. 4. Only one practitioner may furnish and bill the service in a calendar month. 5. The patient may stop CCM services at any time (effective at the end of the month) by phone call to the office staff.   Patient agreed to services and verbal consent obtained.   Follow up plan:   [employer] manager, fast food

## 2020-11-11 NOTE — Progress Notes (Signed)
Agree with anti-coagulation management.  Samuel Covey MD [facility]

## 2020-11-18 ENCOUNTER — Other Ambulatory Visit: Payer: Self-pay

## 2020-11-19 ENCOUNTER — Encounter: Payer: Self-pay | Admitting: Family Medicine

## 2020-11-19 ENCOUNTER — Ambulatory Visit (INDEPENDENT_AMBULATORY_CARE_PROVIDER_SITE_OTHER): Payer: Medicare Other | Admitting: Family Medicine

## 2020-11-19 VITALS — BP 140/80 | HR 79 | Temp 98.2°F | Wt 229.6 lb

## 2020-11-19 DIAGNOSIS — Z9229 Personal history of other drug therapy: Secondary | ICD-10-CM | POA: Diagnosis not present

## 2020-11-19 DIAGNOSIS — Z4589 Encounter for adjustment and management of other implanted devices: Secondary | ICD-10-CM | POA: Diagnosis not present

## 2020-11-19 MED ORDER — WARFARIN SODIUM 5 MG PO TABS: ORAL_TABLET | ORAL | 0 refills | Status: DC

## 2020-11-19 NOTE — Progress Notes (Signed)
Established Patient Office Visit  Subjective:  Patient ID: Samuel [name], male    DOB: 31-Dec-1950  Age: 70 y.o. MRN: [id number]  CC:  Chief Complaint  Patient presents with   Foreign Body in Ear    Patient had tubes put in his ears a few years ago and would like to make sure they are out before scuba diving    HPI Samuel [name] presents for the following items  History of tympanostomy tube on the left.  He is here basically to see if tube is still in place.  Plan trip to [place] in a couple weeks.  He plans to do some diving underwater at 1 to see if the tube is still there.  If present he wanted to look at getting earplugs to keep water out.  States this tube was placed about 3 years ago.    Patient requesting refills of Coumadin.  Recently had Coumadin dose increased through Coumadin clinic to 15 mg daily.  He needs new prescription reflecting increased dosage.  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   Dyslipidemia    Gout    Hemorrhoids    HEMORRHOIDS-INTERNAL 12/20/2009   History of diverticulitis 03/2018   HYPERLIPIDEMIA-MIXED 04/01/2009   HYPOTHYROIDISM 08/26/2008   INSOMNIA, TRANSIENT 04/27/2009   Kidney stones    KNEE PAIN 11/02/2008   LATERAL EPICONDYLITIS 06/02/2009   Migraines    ONYCHOMYCOSIS 11/02/2008   PERSONAL HX COLONIC POLYPS 12/20/2009   Sudden visual loss 12/25/2008   TRANSIENT ISCHEMIC ATTACK 01/25/2009    Past Surgical History:  Procedure Laterality Date   CYSTECTOMY     from abdominal wall   MYRINGOTOMY WITH TUBE PLACEMENT Left    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   TONSILLECTOMY      Family History  Problem Relation Age of Onset   Diabetes Mother    Heart disease Father        valve problem    Social History   Socioeconomic History   Marital status: Married    Spouse name: Not on file   Number of children: Not on file   Years of education: Not on file   Highest  education level: Not on file  Occupational History   Not on file  Tobacco Use   Smoking status: Former    Pack years: 0.00   Smokeless tobacco: Never  Vaping Use   Vaping Use: Never used  Substance and Sexual Activity   Alcohol use: Yes    Alcohol/week: 14.0 standard drinks    Types: 14 Glasses of wine per week    Comment: couple glasses of wine each day   Drug use: No   Sexual activity: Not on file  Other Topics Concern   Not on file  Social History Narrative   Not on file   Social Determinants of Health   Financial Resource Strain: Low Risk    Difficulty of Paying Living Expenses: Not hard at all  Food Insecurity: No Food Insecurity   Worried About Programme researcher, broadcasting/film/video in the Last Year: Never true   Ran Out of Food in the Last Year: Never true  Transportation Needs: No Transportation Needs   Lack of Transportation (Medical): No   Lack of Transportation (Non-Medical): No  Physical Activity: Insufficiently Active   Days of Exercise per Week: 3 days   Minutes of Exercise per Session: 30  min  Stress: Stress Concern Present   Feeling of Stress : To some extent  Social Connections: Moderately Integrated   Frequency of Communication with Friends and Family: More than three times a week   Frequency of Social Gatherings with Friends and Family: Three times a week   Attends Religious Services: Never   Active Member of Clubs or Organizations: Yes   Attends Engineer, structural: More than 4 times per year   Marital Status: Married  Catering manager Violence: Not At Risk   Fear of Current or Ex-Partner: No   Emotionally Abused: No   Physically Abused: No   Sexually Abused: No    Outpatient Medications Prior to Visit  Medication Sig Dispense Refill   allopurinol (ZYLOPRIM) 300 MG tablet TAKE 1 TABLET BY MOUTH EVERY DAY 90 tablet 3   Cholecalciferol 50 MCG (2000 UT) TBDP Take 2 tablets by mouth daily. 30 tablet    ciclopirox (LOPROX) 0.77 % cream Apply topically 2  (two) times daily. 30 g 1   levothyroxine (SYNTHROID) 137 MCG tablet Take 1 tablet (137 mcg total) by mouth daily before breakfast. 30 tablet 2   oxyCODONE (OXY IR/ROXICODONE) 5 MG immediate release tablet oxycodone 5 mg tablet  Take 1 tablet twice a day by oral route as needed.     sildenafil (VIAGRA) 100 MG tablet Take 0.5-1 tablets (50-100 mg total) by mouth daily as needed for erectile dysfunction. 30 tablet 5   terbinafine (LAMISIL) 250 MG tablet Take 1 tablet (250 mg total) by mouth daily. 90 tablet 0   warfarin (COUMADIN) 5 MG tablet TAKE 2 TABS DAILY EXCEPT TAKE 1 AND 1/2 TABS ON SUNDAY AS INSTRUCTED BY ANTICOAGULATION CLINIC 180 tablet 0   No facility-administered medications prior to visit.    Allergies  Allergen Reactions   Shellfish Allergy Anaphylaxis   Penicillins     REACTION: childhood, reaction unknown    ROS Review of Systems  Constitutional:  Negative for fever.  HENT:  Negative for ear discharge, ear pain and hearing loss.   Respiratory:  Negative for shortness of breath.   Cardiovascular:  Negative for chest pain.     Objective:    Physical Exam Constitutional:      Appearance: Normal appearance.  HENT:     Ears:     Comments: Right eardrum is normal.  Left eardrum reveals tympanostomy tube in place.  No visible drainage. Neurological:     Mental Status: He is alert.    BP 140/80 (BP Location: Left Arm, Patient Position: Sitting, Cuff Size: Normal)   Pulse 79   Temp 98.2 F (36.8 C) (Oral)   Wt 229 lb 9.6 oz (104.1 kg)   SpO2 96%   BMI 30.29 kg/m  Wt Readings from Last 3 Encounters:  11/19/20 229 lb 9.6 oz (104.1 kg)  09/29/20 234 lb 4.8 oz (106.3 kg)  08/17/20 242 lb 6.4 oz (110 kg)     Health Maintenance Due  Topic Date Due   Hepatitis C Screening  Never done   Zoster Vaccines- Shingrix (1 of 2) Never done   PNA vac Low Risk Adult (1 of 2 - PCV13) Never done   COVID-19 Vaccine (4 - Booster for Moderna series) 07/09/2020    There are  no preventive care reminders to display for this patient.  Lab Results  Component Value Date   TSH 1.85 10/01/2020   Lab Results  Component Value Date   WBC 6.7 06/21/2020   HGB 16.1 06/21/2020  HCT 47.1 06/21/2020   MCV 91.1 06/21/2020   PLT 189.0 06/21/2020   Lab Results  Component Value Date   NA 136 06/21/2020   K 4.0 06/21/2020   CO2 30 06/21/2020   GLUCOSE 91 06/21/2020   BUN 15 06/21/2020   CREATININE 0.84 06/21/2020   BILITOT 1.1 10/01/2020   ALKPHOS 62 10/01/2020   AST 34 10/01/2020   ALT 40 10/01/2020   PROT 7.0 10/01/2020   ALBUMIN 4.7 10/01/2020   CALCIUM 9.4 06/21/2020   GFR 88.77 06/21/2020   Lab Results  Component Value Date   CHOL 171 07/26/2020   Lab Results  Component Value Date   HDL 44 07/26/2020   Lab Results  Component Value Date   LDLCALC 88 07/26/2020   Lab Results  Component Value Date   TRIG 232 (H) 07/26/2020   Lab Results  Component Value Date   CHOLHDL 3.9 07/26/2020   Lab Results  Component Value Date   HGBA1C  02/23/2009    5.8 (NOTE) The ADA recommends the following therapeutic goal for glycemic control related to Hgb A1c measurement: Goal of therapy: <6.5 Hgb A1c  Reference: American Diabetes Association: Clinical Practice Recommendations 2010, Diabetes Care, 2010, 33: (Suppl  1).      Assessment & Plan:   #1 history of T-tube left eardrum.  Tube still in place.  Patient knows to have good protection to keep water out with upcoming diving  #2 chronic Coumadin therapy secondary to a history of aortic valve replacement -Refilled Coumadin for 3 months and continue close follow-up with Coumadin clinic  Meds ordered this encounter  Medications   warfarin (COUMADIN) 5 MG tablet    Sig: Take 3 tablets (15 mg) by mouth daily.    Dispense:  270 tablet    Refill:  0    Follow-up: No follow-ups on file.    [name], MD

## 2020-11-24 ENCOUNTER — Ambulatory Visit: Payer: Medicare Other

## 2020-11-24 DIAGNOSIS — Z23 Encounter for immunization: Secondary | ICD-10-CM | POA: Diagnosis not present

## 2020-11-29 ENCOUNTER — Ambulatory Visit (INDEPENDENT_AMBULATORY_CARE_PROVIDER_SITE_OTHER): Payer: Medicare Other | Admitting: General Practice

## 2020-11-29 ENCOUNTER — Other Ambulatory Visit: Payer: Self-pay

## 2020-11-29 DIAGNOSIS — M5030 Other cervical disc degeneration, unspecified cervical region: Secondary | ICD-10-CM | POA: Diagnosis not present

## 2020-11-29 DIAGNOSIS — M5412 Radiculopathy, cervical region: Secondary | ICD-10-CM | POA: Diagnosis not present

## 2020-11-29 DIAGNOSIS — Z7901 Long term (current) use of anticoagulants: Secondary | ICD-10-CM

## 2020-11-29 DIAGNOSIS — M5136 Other intervertebral disc degeneration, lumbar region: Secondary | ICD-10-CM | POA: Diagnosis not present

## 2020-11-29 LAB — POCT INR: INR: 3.4 — AB (ref 2.0–3.0)

## 2020-11-29 NOTE — Patient Instructions (Signed)
Pre visit review using our clinic review tool, if applicable. No additional management support is needed unless otherwise documented below in the visit note.  Skip dosage tomorrow and then change dosage and take 3 tablets daily except take 2 tablets on Monday and Fridays.  Re-check in 3 weeks.

## 2020-12-20 ENCOUNTER — Ambulatory Visit (INDEPENDENT_AMBULATORY_CARE_PROVIDER_SITE_OTHER): Payer: Medicare Other | Admitting: General Practice

## 2020-12-20 ENCOUNTER — Other Ambulatory Visit: Payer: Self-pay

## 2020-12-20 DIAGNOSIS — Z7901 Long term (current) use of anticoagulants: Secondary | ICD-10-CM | POA: Diagnosis not present

## 2020-12-20 LAB — POCT INR: INR: 4.5 — AB (ref 2.0–3.0)

## 2020-12-20 NOTE — Patient Instructions (Addendum)
Pre visit review using our clinic review tool, if applicable. No additional management support is needed unless otherwise documented below in the visit note.  Skip dosage tomorrow and Wednesday and then change dosage and take 2 tablets daily except 3 tablets on Sunday Tuesdays and Thursdays.  Re-check in 2 to 3 weeks.

## 2020-12-21 ENCOUNTER — Telehealth: Payer: Self-pay | Admitting: Pharmacist

## 2020-12-21 NOTE — Chronic Care Management (AMB) (Signed)
Chronic Care Management Pharmacy Assistant   Name: KIANDRE SPAGNOLO  MRN: [id number] DOB: 06/09/1951  Samuel [name] is an 70 y.o. year old male who presents for his initial CCM visit with the clinical pharmacist.  Reason for Encounter: Chart Prep for initial visit with Gaylord Shih the clinical pharmacist on 12/27/20.    Conditions to be addressed/monitored: HLD and Hypertensive disorder, Hypothyroidism, Gout and Insomnia.   Recent office visits:  12/20/20 Lodema Pilot RN [facility]) - seen for Anticoagulation office visit.   11/19/20 [name] MD (PCP) - seen for Tympanostomy tube check. Changed Coumadin 5mg  from 2 tablets daily except take 1 and a half tablets on Sunday to take 3 tablets by mouth daily. Follow up with coumadin clinic.  09/29/20 10/01/20 MD (PCP) - seen for fungal skin infection. Patient started on Ciclopirox Olamine 0.77% topically 2 times daily and Terbinafine 250mg  oral daily. Follow up as needed.   08/17/20 MD (PCP) - seen for callus of foot. Discontinued ketoconazole 2%. Follow up as needed.   07/09/20 [name] MD (PCP) - seen for upper abdominal pain. Patient started on sildenafil 50-100 oral daily as needed. Follow up if needed.   Recent consult visits:  07/26/20 [name] MD (Cardiology) - seen for H/O mechanical aortic valve replacement and other chronic conditions. No medication changes. Follow up next winter.   Hospital visits:  None in previous 6 months  Medications: Outpatient Encounter Medications as of 12/21/2020  Medication Sig   allopurinol (ZYLOPRIM) 300 MG tablet TAKE 1 TABLET BY MOUTH EVERY DAY   Cholecalciferol 50 MCG (2000 UT) TBDP Take 2 tablets by mouth daily.   ciclopirox (LOPROX) 0.77 % cream Apply topically 2 (two) times daily.   levothyroxine (SYNTHROID) 137 MCG tablet Take 1 tablet (137 mcg total) by mouth daily before breakfast.   oxyCODONE (OXY IR/ROXICODONE) 5 MG immediate release tablet  oxycodone 5 mg tablet  Take 1 tablet twice a day by oral route as needed.   sildenafil (VIAGRA) 100 MG tablet Take 0.5-1 tablets (50-100 mg total) by mouth daily as needed for erectile dysfunction.   terbinafine (LAMISIL) 250 MG tablet Take 1 tablet (250 mg total) by mouth daily.   warfarin (COUMADIN) 5 MG tablet Take 3 tablets (15 mg) by mouth daily.   No facility-administered encounter medications on file as of 12/21/2020.   Fill History:  levothyroxine 137 mcg tablet 12/10/2020 30   sildenafil 100 mg tablet 08/17/2020 15   TERBINAFINE HCL 250 MG TABLET 10/04/2020 90   WARFARIN SODIUM 5 MG TABLET 11/19/2020 90   ALLOPURINOL 300 MG TABLET 10/17/2020 90   CICLOPIROX 0.77% CREAM 09/29/2020 7    Have you seen any other providers since your last visit?  Yes. Has seen [facility]. Any changes in your medications or health?  Patients Warfarin does has been changed and he has developed a cyst between his knuckle on his right hand.  Any side effects from any medications?  No.  Do you have an symptoms or problems not managed by your medications?  Patient feels his medications are working pretty well.  Any concerns about your health right now?  No concerns at this time.  Has your provider asked that you check blood pressure, blood sugar, or follow special diet at home?  Patient states that Dr. 12/17/2020 cardiologist has stated she wanted him to wear a Holter monitor but he has not worn it yet.  Do you get any type  of exercise on a regular basis? Yes. Patient plays golf 2-3 times a week. However, he has not done much lately.  Can you think of a goal you would like to reach for your health? Patient would like to lose 10-15 pounds, patient would like to maintain his physical health to continue doing activities. For example patients daughter lives in [place] and he went snorkeling. He would like to continue being able to do these things physically.  Do you have any problems getting  your medications? No issues at this time. He just states that its hard getting someone on the phone at [facility] in [place]. Is there anything that you would like to discuss during the appointment? Patient cannot think of anything at this time.   Please bring medications and supplements to appointment.   Care Gaps:  Hepatitis C screening - never done Zoster vaccines SHINGRIX - never done Pneumonia vaccines PCV13 - never done  Star Rating Drugs:  None.   Joycelyn Das CMA  Clinical Pharmacist Assistant [phone]

## 2020-12-22 ENCOUNTER — Other Ambulatory Visit: Payer: Self-pay | Admitting: Family Medicine

## 2020-12-22 MED ORDER — WARFARIN SODIUM 5 MG PO TABS: ORAL_TABLET | ORAL | 0 refills | Status: DC

## 2020-12-23 NOTE — Telephone Encounter (Cosign Needed)
2nd attempt

## 2020-12-27 ENCOUNTER — Other Ambulatory Visit: Payer: Self-pay

## 2020-12-27 ENCOUNTER — Ambulatory Visit (INDEPENDENT_AMBULATORY_CARE_PROVIDER_SITE_OTHER): Payer: Medicare Other | Admitting: Pharmacist

## 2020-12-27 DIAGNOSIS — E039 Hypothyroidism, unspecified: Secondary | ICD-10-CM

## 2020-12-27 DIAGNOSIS — M109 Gout, unspecified: Secondary | ICD-10-CM

## 2020-12-27 NOTE — Progress Notes (Signed)
Chronic Care Management Pharmacy Note  12/28/2020 Name:  Samuel [name] MRN:  [id number] DOB:  03/27/1951  Summary: LDL not at goal < 70 Patient still experiencing significant fatigue Patient did not start supplementing with vitamin D as instructed  Recommendations/Changes made from today's visit: -Recommend high intensity statin therapy given transient cerebral ischemia and ASCVD risk > 20% -Recommend repeat vitamin D level and TSH given recent changes with medications and patient non compliance -Recommended for patient to resume taking warfarin in the evening as he was previously   Plan: Scheduled follow up visit with PCP Follow up for CCM visit in 6 months   Subjective: Samuel [name] is an 70 y.o. year old male who is a primary patient of Burchette, Alinda Sierras, MD.  The CCM team was consulted for assistance with disease management and care coordination needs.    Engaged with patient face to face for initial visit in response to provider referral for pharmacy case management and/or care coordination services.   Consent to Services:  The patient was given the following information about Chronic Care Management services today, agreed to services, and gave verbal consent: 1. CCM service includes personalized support from designated clinical staff supervised by the primary care provider, including individualized plan of care and coordination with other care providers 2. 24/7 contact phone numbers for assistance for urgent and routine care needs. 3. Service will only be billed when office clinical staff spend 20 minutes or more in a month to coordinate care. 4. Only one practitioner may furnish and bill the service in a calendar month. 5.The patient may stop CCM services at any time (effective at the end of the month) by phone call to the office staff. 6. The patient will be responsible for cost sharing (co-pay) of up to 20% of the service fee (after annual deductible is met). Patient agreed to  services and consent obtained.  Patient Care Team: Eulas Post, MD as PCP - Desma Paganini, MD as PCP - Cardiology (Cardiology) Viona Gilmore, [facility] as Pharmacist (Pharmacist)  Recent office visits: 12/20/20 Meriam Sprague RN [facility]) - seen for anticoagulation office visit. INR 4.5, goal 2-3. Held dose x 2 days then decreased to take 2 tablets (10 mg) daily except 3 tablets (15 mg) on Sunday Tuesdays and Thursdays.   11/19/20 Carolann Littler MD (PCP) - seen for Tympanostomy tube check. Changed Coumadin 58m from 2 tablets daily except take 1 and a half tablets on Sunday to take 3 tablets by mouth daily. Follow up with coumadin clinic.   09/29/20 BCarolann LittlerMD (PCP) - seen for fungal skin infection. Patient started on ciclopirox olamine 0.77% topically 2 times daily and terbinafine 2527moral daily. Follow up as needed.   08/17/20 BrCarolann LittlerD (PCP) - seen for callus of foot. Discontinued ketoconazole 2%. Follow up as needed.   07/09/20 BrCarolann LittlerD (PCP) - seen for upper abdominal pain. Patient started on sildenafil 50-100 oral daily as needed. Follow up if needed.   Recent consult visits: [facility] for degeneration of cervical intervertebral disc. Unable to access notes.  08/25/20 RiSuella BroadMD ([facility]): Patient presented for degeneration of cervical intervertebral disc. Unable to access notes.  07/26/20 PaDorris CarnesD (Cardiology) - seen for H/O mechanical aortic valve replacement and other chronic conditions. No medication changes. Follow up next winter.   Hospital visits: None in previous 6 months   Objective:  Lab Results  Component  Value Date   CREATININE 0.84 06/21/2020   BUN 15 06/21/2020   GFR 88.77 06/21/2020   GFRNONAA >60 02/27/2009   GFRAA  02/27/2009    >60        The eGFR has been calculated using the MDRD equation. This calculation has not been validated in all  clinical situations. eGFR's persistently <60 mL/min signify possible Chronic Kidney Disease.   NA 136 06/21/2020   K 4.0 06/21/2020   CALCIUM 9.4 06/21/2020   CO2 30 06/21/2020   GLUCOSE 91 06/21/2020    Lab Results  Component Value Date/Time   HGBA1C  02/23/2009 10:07 AM    5.8 (NOTE) The ADA recommends the following therapeutic goal for glycemic control related to Hgb A1c measurement: Goal of therapy: <6.5 Hgb A1c  Reference: American Diabetes Association: Clinical Practice Recommendations 2010, Diabetes Care, 2010, 33: (Suppl  1).   GFR 88.77 06/21/2020 02:59 PM   GFR 87.11 03/27/2019 03:35 PM    Last diabetic Eye exam: No results found for: HMDIABEYEEXA  Last diabetic Foot exam: No results found for: HMDIABFOOTEX   Lab Results  Component Value Date   CHOL 171 07/26/2020   HDL 44 07/26/2020   LDLCALC 88 07/26/2020   LDLDIRECT 137.9 06/21/2012   TRIG 232 (H) 07/26/2020   CHOLHDL 3.9 07/26/2020    Hepatic Function Latest Ref Rng & Units 10/01/2020 06/21/2020 05/21/2019  Total Protein 6.0 - 8.3 g/dL 7.0 7.2 6.3  Albumin 3.5 - 5.2 g/dL 4.7 5.0 4.3  AST 0 - 37 U/L 34 33 69(H)  ALT 0 - 53 U/L 40 36 119(H)  Alk Phosphatase 39 - 117 U/L 62 67 60  Total Bilirubin 0.2 - 1.2 mg/dL 1.1 1.2 1.1  Bilirubin, Direct 0.0 - 0.3 mg/dL 0.2 - 0.2    Lab Results  Component Value Date/Time   TSH 1.85 10/01/2020 01:55 PM   TSH 0.286 (L) 07/26/2020 04:50 PM   FREET4 1.18 10/01/2020 01:55 PM    CBC Latest Ref Rng & Units 06/21/2020 07/21/2019 03/27/2019  WBC 4.0 - 10.5 K/uL 6.7 7.8 4.9  Hemoglobin 13.0 - 17.0 g/dL 16.1 16.0 16.7  Hematocrit 39.0 - 52.0 % 47.1 46.3 48.1  Platelets 150.0 - 400.0 K/uL 189.0 158.0 175.0    Lab Results  Component Value Date/Time   VD25OH 14.8 (L) 07/26/2020 04:50 PM    Clinical ASCVD: Yes  The 10-year ASCVD risk score Mikey Bussing DC Jr., et al., 2013) is: 20.4%   Values used to calculate the score:     Age: 39 years     Sex: Male     Is Non-Hispanic  African American: No     Diabetic: No     Tobacco smoker: No     Systolic Blood Pressure: 115 mmHg     Is BP treated: No     HDL Cholesterol: 44 mg/dL     Total Cholesterol: 171 mg/dL    Depression screen [facility] 2/9 03/24/2020 02/13/2018 08/14/2016  Decreased Interest 1 0 -  Down, Depressed, Hopeless 1 0 0  PHQ - 2 Score 2 0 0  Altered sleeping 0 - -  Tired, decreased energy 1 - -  Change in appetite 0 - -  Feeling bad or failure about yourself  1 - -  Trouble concentrating 0 - -  Moving slowly or fidgety/restless 0 - -  Suicidal thoughts 0 - -  PHQ-9 Score 4 - -  Difficult doing work/chores Not difficult at all - -  Some recent data  might be hidden     Social History   Tobacco Use  Smoking Status Former  Smokeless Tobacco Never   BP Readings from Last 3 Encounters:  11/19/20 140/80  09/29/20 120/70  08/17/20 130/80   Pulse Readings from Last 3 Encounters:  11/19/20 79  09/29/20 77  08/17/20 73   Wt Readings from Last 3 Encounters:  11/19/20 229 lb 9.6 oz (104.1 kg)  09/29/20 234 lb 4.8 oz (106.3 kg)  08/17/20 242 lb 6.4 oz (110 kg)   BMI Readings from Last 3 Encounters:  11/19/20 30.29 kg/m  09/29/20 30.91 kg/m  08/17/20 31.98 kg/m    Assessment/Interventions: Review of patient past medical history, allergies, medications, health status, including review of consultants reports, laboratory and other test data, was performed as part of comprehensive evaluation and provision of chronic care management services.   SDOH:  (Social Determinants of Health) assessments and interventions performed: Yes SDOH Interventions    Flowsheet Row Most Recent Value  SDOH Interventions   Financial Strain Interventions Intervention Not Indicated  Transportation Interventions Intervention Not Indicated      SDOH Screenings   Alcohol Screen: Low Risk    Last Alcohol Screening Score (AUDIT): 1  Depression (PHQ2-9): Low Risk    PHQ-2 Score: 4  Financial Resource Strain: Low  Risk    Difficulty of Paying Living Expenses: Not hard at all  Food Insecurity: No Food Insecurity   Worried About Charity fundraiser in the Last Year: Never true   Ran Out of Food in the Last Year: Never true  Housing: Low Risk    Last Housing Risk Score: 0  Physical Activity: Insufficiently Active   Days of Exercise per Week: 3 days   Minutes of Exercise per Session: 30 min  Social Connections: Moderately Integrated   Frequency of Communication with Friends and Family: More than three times a week   Frequency of Social Gatherings with Friends and Family: Three times a week   Attends Religious Services: Never   Active Member of Clubs or Organizations: Yes   Attends Music therapist: More than 4 times per year   Marital Status: Married  Stress: Stress Concern Present   Feeling of Stress : To some extent  Tobacco Use: Medium Risk   Smoking Tobacco Use: Former   Smokeless Tobacco Use: Never  Transportation Needs: No Data processing manager (Medical): No   Lack of Transportation (Non-Medical): No   Patient's morning routine consists of getting up, drinking coffee, taking his medications, showering and then going to work. 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 He works throughout the week and tries not the work on the weekends.  Patient is not very consistent with his diet and tries to follow a keto diet. He can maintain it for a few months but then resorts back to his previous ways. He is on keto right now. He reports there is a lot of prep involved, which is why it is hard to keep up with. He eats mostly low carb keto, not no carb, and he feels energized. He cut out organ meats due to the gout but eats all others and mostly chicken. He drinks water and coffee only.   Patient plays golf 3-4 times a week and walks around the course when he plays which is about 3.5 miles.  Patient has not been  sleeping well and feels tired  pretty much all the time. He went to [place] 4 weeks ago and has felt tired since then. He does take some naps and has to go to the bathroom throughout the night. He reports not so much trouble falling back asleep.  Patient reports he thinks his only issue with his medications right now is the levothyroxine. He was using the 150 mcg tablets for so long and never changed to the lower dose until recently (post lab work) because he ran out of the 150 mcg tablets and is using the 137 mcg tablets. He wasn't sure if this had something to do with his change in energy. Within the past few months, he also moved his warfarin to be taken in the morning with his other medications.  CCM Care Plan  Allergies  Allergen Reactions   Shellfish Allergy Anaphylaxis   Penicillins     REACTION: childhood, reaction unknown    Medications Reviewed Today     Reviewed by Eulas Post, MD (Physician) on 11/19/20 at 1142  Med List Status: <None>   Medication Order Taking? Sig Documenting Provider Last Dose Status Informant  allopurinol (ZYLOPRIM) 300 MG tablet [id number] No TAKE 1 TABLET BY MOUTH EVERY DAY Burchette, Alinda Sierras, MD Taking Active   Cholecalciferol 50 MCG (2000 UT) TBDP [id number] No Take 2 tablets by mouth daily. Fay Records, MD Taking Active   ciclopirox (LOPROX) 0.77 % cream [id number]  Apply topically 2 (two) times daily. Eulas Post, MD  Active   levothyroxine (SYNTHROID) 137 MCG tablet [id number] No Take 1 tablet (137 mcg total) by mouth daily before breakfast. Eulas Post, MD Taking Active   oxyCODONE (OXY IR/ROXICODONE) 5 MG immediate release tablet [id number] No oxycodone 5 mg tablet  Take 1 tablet twice a day by oral route as needed. [provider] Taking Active   sildenafil (VIAGRA) 100 MG tablet [id number] No Take 0.5-1 tablets (50-100 mg total) by mouth daily as needed for erectile dysfunction. Eulas Post, MD Taking Active    terbinafine (LAMISIL) 250 MG tablet [id number]  Take 1 tablet (250 mg total) by mouth daily. Eulas Post, MD  Active   Discontinued 11/19/20 1142 (Error)             Patient Active Problem List   Diagnosis Date Noted   Cervical radiculitis 02/27/2019   Lumbar spondylosis 02/27/2019   Cervical spondylosis 12/04/2018   Hypertensive disorder 06/24/2018   Thyroiditis 06/24/2018   Conductive hearing loss of left ear with unrestricted hearing of right ear 06/20/2018   Low back pain 05/20/2018   Injury of left rotator cuff 05/15/2018   Neck pain 05/15/2018   Strain of neck muscle 05/15/2018   Gout 02/22/2018   Long term (current) use of anticoagulants 03/26/2017   Encounter for therapeutic drug monitoring 10/02/2013   Elevated blood pressure reading without diagnosis of hypertension 06/23/2013   Cerumen impaction 06/23/2013   Long term current use of anticoagulant 07/13/2010   HEMORRHOIDS-INTERNAL 12/20/2009   PERSONAL HX COLONIC POLYPS 12/20/2009   LATERAL EPICONDYLITIS 06/02/2009   CONJUNCTIVITIS, BACTERIAL 04/27/2009   INSOMNIA, TRANSIENT 04/27/2009   Hyperlipidemia 04/01/2009   Transient cerebral ischemia 01/25/2009   SUDDEN VISUAL LOSS 12/25/2008   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   ONYCHOMYCOSIS 11/02/2008   KNEE PAIN 11/02/2008   Hypothyroidism 08/26/2008    Immunization History  Administered Date(s) Administered   Moderna SARS-COV2 Booster Vaccination 04/08/2020   Moderna Sars-Covid-2 Vaccination 07/24/2019, 08/21/2019, 11/24/2020   Tdap 11/05/2013  Conditions to be addressed/monitored:  Hyperlipidemia, Hypothyroidism, Gout, and Aortic valve replacement, Vitamin D deficiency  Care Plan : CCM Pharmacy Care Plan  Updates made by Viona Gilmore, [place] since 12/28/2020 12:00 AM     Problem: Problem: Hyperlipidemia, Hypothyroidism, Gout, and Aortic valve replacement, Vitamin D deficiency      Long-Range Goal: Patient-Specific Goal   Start Date:  12/27/2020  Expected End Date: 12/27/2021  This Visit's Progress: On track  Priority: High  Note:   Current Barriers:  Unable to independently monitor therapeutic efficacy Unable to maintain control of hypothyroidism Does not adhere to prescribed medication regimen  Pharmacist Clinical Goal(s):  Patient will achieve adherence to monitoring guidelines and medication adherence to achieve therapeutic efficacy maintain control of hypothyroidism as evidenced by TSH  through collaboration with PharmD and provider.   Interventions: 1:1 collaboration with Eulas Post, MD regarding development and update of comprehensive plan of care as evidenced by provider attestation and co-signature Inter-disciplinary care team collaboration (see longitudinal plan of care) Comprehensive medication review performed; medication list updated in electronic medical record  Hyperlipidemia: (LDL goal < 70) -Uncontrolled  -Current treatment: No medications -Medications previously tried: none -Current dietary patterns: following a keto diet -Current exercise habits: plays golf but no formal exercise -Educated on Cholesterol goals;  Importance of limiting foods high in cholesterol; Exercise goal of 150 minutes per week; -Counseled on diet and exercise extensively Consider statin therapy based on ASCVD risk and elevated triglycerides  Hypothyroidsim (Goal: TSH 0.35-4.5) -Controlled (based on recent TSH and not on drug therapy) -Current treatment  Levothyroxine 137 mcg 1 tablet once daily -Medications previously tried: none  -Recommended to continue current medication Educated on importance of being consistent with taking this medication and separating from warfarin.  Gout (Goal: prevent flare ups, uric acid < 6) -Controlled -Current treatment  Allopurinol 300 mg  1 tablet daily -Medications previously tried: none  -Recommended to continue current medication Counseled on foods with high purine  content or foods that don't allow for purines to be processes effectively Recommended repeat uric acid level  Vitamin D deficency (Goal: vitamin D 30-100) -Uncontrolled -Current treatment  No medications -Medications previously tried: none  -Counseled on symptoms of low vitamin D and the recommendations for supplementation as he was unaware to start. Recommended taking with food. Schedule office visit with PCP to get lab work for repeat vitamin D level.  Aortic valve replacement (Goal: prevent blood clots) -Controlled -Current treatment  Warfarin 5 mg 1 tablet as directed by the coumadin clinic -Medications previously tried: none  -Counseled on diet and exercise extensively Recommended to continue current medication Counseled on consistent intake of greens from week to week. Recommended moving to the evening time for ease of dose adjustments and separating from levothyroxine.  Pain (Goal: minimize pain) -Controlled -Current treatment  Oxycodone 5 mg 1 tablet twice daily as needed (taking 1/2 before golf and 1/2 tablet after) -Medications previously tried: none  -Counseled on limiting use  Health Maintenance -Vaccine gaps: shingrix, Prevnar -Current therapy:  none -Educated on Cost vs benefit of each product must be carefully weighed by individual consumer -Patient is satisfied with current therapy and denies issues -Recommended to continue as is.  Patient Goals/Self-Care Activities Patient will:  - take medications as prescribed target a minimum of 150 minutes of moderate intensity exercise weekly  Follow Up Plan: Telephone follow up appointment with care management team member scheduled for: 6 months       Medication Assistance: None  required.  Patient affirms current coverage meets needs.  Compliance/Adherence/Medication fill history: Care Gaps: Hep C screening, shingrix, Prevnar  Star-Rating Drugs: None  Patient's preferred pharmacy is:  [id number]  [place] [place]Phone: [phone]: [phone] [facility] [place] [place]Phone: [phone]: [phone] Uses pill box? Yes Pt endorses 100% compliance  We discussed: Current pharmacy is preferred with insurance plan and patient is satisfied with pharmacy services Patient decided to: Continue current medication management strategy  Care Plan and Follow Up Patient Decision:  Patient agrees to Care Plan and Follow-up.  Plan: Telephone follow up appointment with care management team member scheduled for:  6 months  MJeni Salles PharmD, BFondaPharmacist [phone]

## 2020-12-28 NOTE — Patient Instructions (Addendum)
Hi Samuel [name],  It was great to get to meet you in person! Don't forget to take vitamin D with food if Dr. Caryl [name] starts supplementation and move your warfarin back to the evening time to avoid interaction with levothyroxine. Below is a summary of some of the topics we discussed.   Please reach out to me if you have any questions or need anything before our follow up!  Best, Samuel  Gaylord [name], PharmD, [facility] Clinical Pharmacist [facility] [place] [phone]   Visit Information   Goals Addressed             This Visit's Progress    Manage My Medicine       Timeframe:  Short-Term Goal Priority:  Medium Start Date:                             Expected End Date:                       Follow Up Date 07/01/2021    - keep a list of all the medicines I take; vitamins and herbals too - use a pillbox to sort medicine - use an alarm clock or phone to remind me to take my medicine    Why is this important?   These steps will help you keep on track with your medicines.   Notes:        Patient Care Plan: CCM Pharmacy Care Plan     Problem Identified: Problem: Hyperlipidemia, Hypothyroidism, Gout, and Aortic valve replacement, Vitamin D deficiency      Long-Range Goal: Patient-Specific Goal   Start Date: 12/27/2020  Expected End Date: 12/27/2021  This Visit's Progress: On track  Priority: High  Note:   Current Barriers:  Unable to independently monitor therapeutic efficacy Unable to maintain control of hypothyroidism Does not adhere to prescribed medication regimen  Pharmacist Clinical Goal(s):  Patient will achieve adherence to monitoring guidelines and medication adherence to achieve therapeutic efficacy maintain control of hypothyroidism as evidenced by TSH  through collaboration with PharmD and provider.   Interventions: 1:1 collaboration with Samuel Covey, MD regarding development and update of comprehensive plan of care as evidenced by  provider attestation and co-signature Inter-disciplinary care team collaboration (see longitudinal plan of care) Comprehensive medication review performed; medication list updated in electronic medical record  Hyperlipidemia: (LDL goal < 70) -Uncontrolled  -Current treatment: No medications -Medications previously tried: none -Current dietary patterns: following a keto diet -Current exercise habits: plays golf but no formal exercise -Educated on Cholesterol goals;  Importance of limiting foods high in cholesterol; Exercise goal of 150 minutes per week; -Counseled on diet and exercise extensively Consider statin therapy based on ASCVD risk and elevated triglycerides  Hypothyroidsim (Goal: TSH 0.35-4.5) -Controlled (based on recent TSH and not on drug therapy) -Current treatment  Levothyroxine 137 mcg 1 tablet once daily -Medications previously tried: none  -Recommended to continue current medication Educated on importance of being consistent with taking this medication and separating from warfarin.  Gout (Goal: prevent flare ups, uric acid < 6) -Controlled -Current treatment  Allopurinol 300 mg  1 tablet daily -Medications previously tried: none  -Recommended to continue current medication Counseled on foods with high purine content or foods that don't allow for purines to be processes effectively Recommended repeat uric acid level  Vitamin D deficency (Goal: vitamin D 30-100) -Uncontrolled -Current treatment  No medications -Medications previously tried:  none  -Counseled on symptoms of low vitamin D and the recommendations for supplementation as he was unaware to start. Recommended taking with food. Schedule office visit with PCP to get lab work for repeat vitamin D level.  Aortic valve replacement (Goal: prevent blood clots) -Controlled -Current treatment  Warfarin 5 mg 1 tablet as directed by the coumadin clinic -Medications previously tried: none  -Counseled on diet  and exercise extensively Recommended to continue current medication Counseled on consistent intake of greens from week to week. Recommended moving to the evening time for ease of dose adjustments and separating from levothyroxine.  Pain (Goal: minimize pain) -Controlled -Current treatment  Oxycodone 5 mg 1 tablet twice daily as needed (taking 1/2 before golf and 1/2 tablet after) -Medications previously tried: none  -Counseled on limiting use  Health Maintenance -Vaccine gaps: shingrix, Prevnar -Current therapy:  none -Educated on Cost vs benefit of each product must be carefully weighed by individual consumer -Patient is satisfied with current therapy and denies issues -Recommended to continue as is.  Patient Goals/Self-Care Activities Patient will:  - take medications as prescribed target a minimum of 150 minutes of moderate intensity exercise weekly  Follow Up Plan: Telephone follow up appointment with care management team member scheduled for: 6 months      Samuel [name] was given information about Chronic Care Management services today including:  CCM service includes personalized support from designated clinical staff supervised by his physician, including individualized plan of care and coordination with other care providers 24/7 contact phone numbers for assistance for urgent and routine care needs. Standard insurance, coinsurance, copays and deductibles apply for chronic care management only during months in which we provide at least 20 minutes of these services. Most insurances cover these services at 100%, however patients may be responsible for any copay, coinsurance and/or deductible if applicable. This service may help you avoid the need for more expensive face-to-face services. Only one practitioner may furnish and bill the service in a calendar month. The patient may stop CCM services at any time (effective at the end of the month) by phone call to the office  staff.  Patient agreed to services and verbal consent obtained.   Patient verbalizes understanding of instructions provided today and agrees to view in MyChart.  Telephone follow up appointment with pharmacy team member scheduled for: 6 months  Samuel [name], [facility]

## 2020-12-29 ENCOUNTER — Ambulatory Visit (INDEPENDENT_AMBULATORY_CARE_PROVIDER_SITE_OTHER): Payer: Medicare Other | Admitting: Family Medicine

## 2020-12-29 ENCOUNTER — Other Ambulatory Visit: Payer: Self-pay

## 2020-12-29 VITALS — BP 124/70 | HR 69 | Temp 98.2°F | Wt 234.7 lb

## 2020-12-29 DIAGNOSIS — M67441 Ganglion, right hand: Secondary | ICD-10-CM

## 2020-12-29 DIAGNOSIS — E039 Hypothyroidism, unspecified: Secondary | ICD-10-CM

## 2020-12-29 DIAGNOSIS — E781 Pure hyperglyceridemia: Secondary | ICD-10-CM | POA: Diagnosis not present

## 2020-12-29 DIAGNOSIS — E559 Vitamin D deficiency, unspecified: Secondary | ICD-10-CM | POA: Diagnosis not present

## 2020-12-29 LAB — VITAMIN D 25 HYDROXY (VIT D DEFICIENCY, FRACTURES): VITD: 18.43 ng/mL — ABNORMAL LOW (ref 30.00–100.00)

## 2020-12-29 LAB — TSH: TSH: 1.04 u[IU]/mL (ref 0.35–5.50)

## 2020-12-29 NOTE — Progress Notes (Signed)
Established Patient Office Visit  Subjective:  Patient ID: Samuel [name], male    DOB: February 16, 1951  Age: 70 y.o. MRN: [id number]  CC:  Chief Complaint  Patient presents with   Open Wound    X 2 month, sore on r hand, no discomfort, pt states it feels like a marble under the skin    HPI Samuel [name] presents to address several issues as follows  He had recent visit with clinical pharmacist.  It was noted that he had history of "TIA "on his problem list.  Neither patient nor I had any recollection of this.  On further investigation it looks like this problem was entered back and visit from October 2015 (03/30/21) when he presented here with acute phlebitis symptoms of upper extremity.  There is nothing clinically from that visit to suggest likely TIA.  Because of that reported history there was recommendation for high-dose statin use to get LDL less than 70.  However, he has no known history of cerebrovascular disease or coronary disease.  He has had previous heart surgery for bicuspid aortic valve and is on anticoagulation.  He does have hyperlipidemia and had recent triglycerides 232.  Has done some binge drinking in the past but states he has not been using any alcohol recently.  Hypothyroidism.  He had low TSH and was advised to reduce thyroid medication from 150-137.  He apparently did not make the change right away and at the time his last labs were checked was actually still taking 150 mcg dose.  He states he has been on the 137 micrograms dose now for about 2 months  History of low vitamin D back in February 2014 0.8.  He is currently not taking replacement but is out in the sun quite a bit.  He frequently golfs.  Nonpainful cyst dorsum right middle finger near the PIP joint.  No reported injury.  Full range of motion all digits of the hand.  Does complain of some fatigue issues.  Visited daughter who lives in [place] and came back about couple weeks ago.  6-hour time difference.  Had a  great visit with her and no impairment with activities there such as hiking or snorkeling.  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   Dyslipidemia    Gout    Hemorrhoids    HEMORRHOIDS-INTERNAL 12/20/2009   History of diverticulitis 03/2018   HYPERLIPIDEMIA-MIXED 04/01/2009   HYPOTHYROIDISM 08/26/2008   INSOMNIA, TRANSIENT 04/27/2009   Kidney stones    KNEE PAIN 11/02/2008   LATERAL EPICONDYLITIS 06/02/2009   Migraines    ONYCHOMYCOSIS 11/02/2008   PERSONAL HX COLONIC POLYPS 12/20/2009   Sudden visual loss 12/25/2008   TRANSIENT ISCHEMIC ATTACK 01/25/2009    Past Surgical History:  Procedure Laterality Date   CYSTECTOMY     from abdominal wall   MYRINGOTOMY WITH TUBE PLACEMENT Left    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   TONSILLECTOMY      Family History  Problem Relation Age of Onset   Diabetes Mother    Heart disease Father        valve problem    Social History   Socioeconomic History   Marital status: Married    Spouse name: Not on file   Number of children: Not on file   Years of education: Not on file   Highest education level: Not on file  Occupational History   Not on file  Tobacco Use   Smoking status: Former   Smokeless tobacco: Never  Vaping Use   Vaping Use: Never used  Substance and Sexual Activity   Alcohol use: Yes    Alcohol/week: 14.0 standard drinks    Types: 14 Glasses of wine per week    Comment: couple glasses of wine each day   Drug use: No   Sexual activity: Not on file  Other Topics Concern   Not on file  Social History Narrative   Not on file   Social Determinants of Health   Financial Resource Strain: Low Risk    Difficulty of Paying Living Expenses: Not hard at all  Food Insecurity: No Food Insecurity   Worried About Programme researcher, broadcasting/film/videounning Out of Food in the Last Year: Never true   Ran Out of Food in the Last Year: Never true  Transportation Needs: No Transportation  Needs   Lack of Transportation (Medical): No   Lack of Transportation (Non-Medical): No  Physical Activity: Insufficiently Active   Days of Exercise per Week: 3 days   Minutes of Exercise per Session: 30 min  Stress: Stress Concern Present   Feeling of Stress : To some extent  Social Connections: Moderately Integrated   Frequency of Communication with Friends and Family: More than three times a week   Frequency of Social Gatherings with Friends and Family: Three times a week   Attends Religious Services: Never   Active Member of Clubs or Organizations: Yes   Attends Engineer, structuralClub or Organization Meetings: More than 4 times per year   Marital Status: Married  Catering managerntimate Partner Violence: Not At Risk   Fear of Current or Ex-Partner: No   Emotionally Abused: No   Physically Abused: No   Sexually Abused: No    Outpatient Medications Prior to Visit  Medication Sig Dispense Refill   allopurinol (ZYLOPRIM) 300 MG tablet TAKE 1 TABLET BY MOUTH EVERY DAY 90 tablet 3   Cholecalciferol 50 MCG (2000 UT) TBDP Take 2 tablets by mouth daily. 30 tablet    levothyroxine (SYNTHROID) 137 MCG tablet Take 1 tablet (137 mcg total) by mouth daily before breakfast. 30 tablet 2   oxyCODONE (OXY IR/ROXICODONE) 5 MG immediate release tablet oxycodone 5 mg tablet  Take 1 tablet twice a day by oral route as needed.     sildenafil (VIAGRA) 100 MG tablet Take 0.5-1 tablets (50-100 mg total) by mouth daily as needed for erectile dysfunction. 30 tablet 5   terbinafine (LAMISIL) 250 MG tablet Take 1 tablet (250 mg total) by mouth daily. 90 tablet 0   warfarin (COUMADIN) 5 MG tablet Take 3 tablets (15 mg) by mouth daily. 270 tablet 0   No facility-administered medications prior to visit.    Allergies  Allergen Reactions   Shellfish Allergy Anaphylaxis   Penicillins     REACTION: childhood, reaction unknown    ROS Review of Systems  Constitutional:  Positive for fatigue.  Eyes:  Negative for visual disturbance.   Respiratory:  Negative for cough, chest tightness and shortness of breath.   Cardiovascular:  Negative for chest pain, palpitations and leg swelling.  Neurological:  Negative for dizziness, syncope, weakness, light-headedness and headaches.     Objective:    Physical Exam Constitutional:      Appearance: He is well-developed.  HENT:     Right Ear: External ear normal.     Left Ear: External ear normal.  Eyes:     Pupils: Pupils  are equal, round, and reactive to light.  Neck:     Thyroid: No thyromegaly.  Cardiovascular:     Rate and Rhythm: Normal rate and regular rhythm.  Pulmonary:     Effort: Pulmonary effort is normal. No respiratory distress.     Breath sounds: Normal breath sounds. No wheezing or rales.  Musculoskeletal:     Cervical back: Neck supple.     Comments: Small cystic lesion dorsum right middle finger near the PIP joint.  This is somewhat translucent and superficial and consistent with digital mucous cyst.  Full range of motion all joints of the hand  Neurological:     Mental Status: He is alert and oriented to person, place, and time.    BP 124/70 (BP Location: Left Arm, Patient Position: Sitting, Cuff Size: Normal)   Pulse 69   Temp 98.2 F (36.8 C) (Oral)   Wt 234 lb 11.2 oz (106.5 kg)   SpO2 97%   BMI 30.96 kg/m  Wt Readings from Last 3 Encounters:  12/29/20 234 lb 11.2 oz (106.5 kg)  11/19/20 229 lb 9.6 oz (104.1 kg)  09/29/20 234 lb 4.8 oz (106.3 kg)     Health Maintenance Due  Topic Date Due   Hepatitis C Screening  Never done   Zoster Vaccines- Shingrix (1 of 2) Never done   PNA vac Low Risk Adult (1 of 2 - PCV13) Never done    There are no preventive care reminders to display for this patient.  Lab Results  Component Value Date   TSH 1.85 10/01/2020   Lab Results  Component Value Date   WBC 6.7 06/21/2020   HGB 16.1 06/21/2020   HCT 47.1 06/21/2020   MCV 91.1 06/21/2020   PLT 189.0 06/21/2020   Lab Results  Component Value  Date   NA 136 06/21/2020   K 4.0 06/21/2020   CO2 30 06/21/2020   GLUCOSE 91 06/21/2020   BUN 15 06/21/2020   CREATININE 0.84 06/21/2020   BILITOT 1.1 10/01/2020   ALKPHOS 62 10/01/2020   AST 34 10/01/2020   ALT 40 10/01/2020   PROT 7.0 10/01/2020   ALBUMIN 4.7 10/01/2020   CALCIUM 9.4 06/21/2020   GFR 88.77 06/21/2020   Lab Results  Component Value Date   CHOL 171 07/26/2020   Lab Results  Component Value Date   HDL 44 07/26/2020   Lab Results  Component Value Date   LDLCALC 88 07/26/2020   Lab Results  Component Value Date   TRIG 232 (H) 07/26/2020   Lab Results  Component Value Date   CHOLHDL 3.9 07/26/2020   Lab Results  Component Value Date   HGBA1C  02/23/2009    5.8 (NOTE) The ADA recommends the following therapeutic goal for glycemic control related to Hgb A1c measurement: Goal of therapy: <6.5 Hgb A1c  Reference: American Diabetes Association: Clinical Practice Recommendations 2010, Diabetes Care, 2010, 33: (Suppl  1).      Assessment & Plan:   #1 hypothyroidism.  History of being over replaced.  He had been at 1 point taking medications concomitant with his thyroid but is currently taking this on empty stomach and appropriately.  Has been on 137 dosage now for couple months  -Recheck TSH  #2 history of low vitamin D.  Currently not on replacement. -Recheck 25-hydroxy vitamin D.  If still low will discuss replacement  #3 benign digital mucous cyst right middle finger -Reassurance  #4 hypertriglyceridemia.  We confirm no history of documented TIA.  Would not recommend pharmacologic therapy for triglycerides at this point but first would recommend scaling back alcohol and dietary modification.  We discussed diet high in omega-3's.  Handout given.   No orders of the defined types were placed in this encounter.   Follow-up: No follow-ups on file.    [name], MD

## 2020-12-29 NOTE — Patient Instructions (Signed)
You have a benign digital mucus cyst of the right middle finger.

## 2021-01-03 ENCOUNTER — Other Ambulatory Visit: Payer: Self-pay | Admitting: Family Medicine

## 2021-01-06 ENCOUNTER — Other Ambulatory Visit: Payer: Self-pay | Admitting: Family Medicine

## 2021-01-10 ENCOUNTER — Ambulatory Visit: Payer: Medicare Other

## 2021-01-12 ENCOUNTER — Ambulatory Visit: Payer: Medicare Other

## 2021-01-13 DIAGNOSIS — M79676 Pain in unspecified toe(s): Secondary | ICD-10-CM | POA: Diagnosis not present

## 2021-01-13 DIAGNOSIS — B351 Tinea unguium: Secondary | ICD-10-CM | POA: Diagnosis not present

## 2021-02-09 DIAGNOSIS — M488X9 Other specified spondylopathies, site unspecified: Secondary | ICD-10-CM | POA: Diagnosis not present

## 2021-02-09 DIAGNOSIS — M79646 Pain in unspecified finger(s): Secondary | ICD-10-CM | POA: Diagnosis not present

## 2021-02-09 DIAGNOSIS — M5416 Radiculopathy, lumbar region: Secondary | ICD-10-CM | POA: Diagnosis not present

## 2021-02-09 DIAGNOSIS — E559 Vitamin D deficiency, unspecified: Secondary | ICD-10-CM | POA: Diagnosis not present

## 2021-02-09 DIAGNOSIS — R202 Paresthesia of skin: Secondary | ICD-10-CM | POA: Diagnosis not present

## 2021-02-09 DIAGNOSIS — M456 Ankylosing spondylitis lumbar region: Secondary | ICD-10-CM | POA: Diagnosis not present

## 2021-02-09 DIAGNOSIS — E539 Vitamin B deficiency, unspecified: Secondary | ICD-10-CM | POA: Diagnosis not present

## 2021-02-09 DIAGNOSIS — M2550 Pain in unspecified joint: Secondary | ICD-10-CM | POA: Diagnosis not present

## 2021-02-09 DIAGNOSIS — G894 Chronic pain syndrome: Secondary | ICD-10-CM | POA: Diagnosis not present

## 2021-02-11 ENCOUNTER — Other Ambulatory Visit: Payer: Self-pay | Admitting: Family Medicine

## 2021-02-11 NOTE — Telephone Encounter (Signed)
Pt has not been compliant with coumadin management so only sent in 30 tablets. Pt is scheduled for 02/16/21 for coumadin clinic. Will send in another script at that time. Pt is up to date on PCP apts.

## 2021-02-16 ENCOUNTER — Ambulatory Visit (INDEPENDENT_AMBULATORY_CARE_PROVIDER_SITE_OTHER): Payer: Medicare Other

## 2021-02-16 ENCOUNTER — Other Ambulatory Visit: Payer: Self-pay

## 2021-02-16 DIAGNOSIS — Z7901 Long term (current) use of anticoagulants: Secondary | ICD-10-CM | POA: Diagnosis not present

## 2021-02-16 LAB — POCT INR: INR: 3.6 — AB (ref 2.0–3.0)

## 2021-02-16 NOTE — Patient Instructions (Addendum)
Pre visit review using our clinic review tool, if applicable. No additional management support is needed unless otherwise documented below in the visit note.  Skip dosage today and change weekly dose to take 2 tablets daily except take 3 tablets on Wednesdays. Recheck in 3 wks.

## 2021-02-18 ENCOUNTER — Other Ambulatory Visit: Payer: Self-pay

## 2021-02-18 ENCOUNTER — Ambulatory Visit (INDEPENDENT_AMBULATORY_CARE_PROVIDER_SITE_OTHER): Payer: Medicare Other | Admitting: Family Medicine

## 2021-02-18 VITALS — BP 142/80 | HR 81 | Temp 97.8°F | Wt 239.7 lb

## 2021-02-18 DIAGNOSIS — M19049 Primary osteoarthritis, unspecified hand: Secondary | ICD-10-CM | POA: Diagnosis not present

## 2021-02-18 DIAGNOSIS — R41840 Attention and concentration deficit: Secondary | ICD-10-CM | POA: Diagnosis not present

## 2021-02-18 NOTE — Progress Notes (Signed)
Established Patient Office Visit  Subjective:  Patient ID: Samuel [name], male    DOB: [birth year]  Age: 70 y.o. MRN: [id number]  CC:  Chief Complaint  Patient presents with   medication managment    Wants to discuss meds with Samuel [name]    HPI Samuel [name] presents to discuss concerns for possible adult attention deficit disorder.  Patient states he has a daughter who was diagnosed with ADD not long ago.  He states he has similar issues.  He has had difficulties most of his life with focusing and completing tasks.  He states that even though he went to [employer] he had to struggle in school and did not finish his undergraduate degree until age 25.  He is fairly certain from reading about ADD that he may have this.  He is still working full-time and anticipates working for several more years.  His job requires sometimes multitasking and increased focus and he feels that this does impair his work.  He had some arthritis issues involving the hands predominantly PIP and DIP joints.  He went to [facility] and reportedly had several labs done recently.  Presumably, these were done to rule out inflammatory arthritis.  He does have history of gout but has [name] had any hand involvement.  He takes allopurinol regularly and denies any recent gout flareups.  His previous involvement has been predominantly feet.  He does have history of bicuspid aortic valve and has had previous aortic valve replacement.  No known CAD history.  He is on chronic Coumadin.  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   Dyslipidemia    Gout    Hemorrhoids    HEMORRHOIDS-INTERNAL 12/20/2009   History of diverticulitis 03/2018   HYPERLIPIDEMIA-MIXED 04/01/2009   HYPOTHYROIDISM 08/26/2008   INSOMNIA, TRANSIENT 04/27/2009   Kidney stones    KNEE PAIN 11/02/2008   LATERAL EPICONDYLITIS 06/02/2009   Migraines    ONYCHOMYCOSIS 11/02/2008   PERSONAL HX COLONIC  POLYPS 12/20/2009   Sudden visual loss 12/25/2008   TRANSIENT ISCHEMIC ATTACK 01/25/2009    Past Surgical History:  Procedure Laterality Date   CYSTECTOMY     from abdominal wall   MYRINGOTOMY WITH TUBE PLACEMENT Left    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   TONSILLECTOMY      Family History  Problem Relation Age of Onset   Diabetes Mother    Heart disease Father        valve problem    Social History   Socioeconomic History   Marital status: Married    Spouse name: Not on file   Number of children: Not on file   Years of education: Not on file   Highest education level: Not on file  Occupational History   Not on file  Tobacco Use   Smoking status: Former   Smokeless tobacco: [name]  Vaping Use   Vaping Use: [name] used  Substance and Sexual Activity   Alcohol use: Yes    Alcohol/week: 14.0 standard drinks    Types: 14 Glasses of wine per week    Comment: couple glasses of wine each day   Drug use: No   Sexual activity: Not on file  Other Topics Concern   Not on file  Social History Narrative   Not on file   Social Determinants of Health   Financial Resource Strain: Low Risk  Difficulty of Paying Living Expenses: Not hard at all  Food Insecurity: No Food Insecurity   Worried About Running Out of Food in the Last Year: [name] true   Ran Out of Food in the Last Year: [name] true  Transportation Needs: No Transportation Needs   Lack of Transportation (Medical): No   Lack of Transportation (Non-Medical): No  Physical Activity: Insufficiently Active   Days of Exercise per Week: 3 days   Minutes of Exercise per Session: 30 min  Stress: Stress Concern Present   Feeling of Stress : To some extent  Social Connections: Moderately Integrated   Frequency of Communication with Friends and Family: More than three times a week   Frequency of Social Gatherings with Friends and Family: Three times a week   Attends Religious Services: [name]   Active Member of  Clubs or Organizations: Yes   Attends Engineer, structural: More than 4 times per year   Marital Status: Married  Catering manager Violence: Not At Risk   Fear of Current or Ex-Partner: No   Emotionally Abused: No   Physically Abused: No   Sexually Abused: No    Outpatient Medications Prior to Visit  Medication Sig Dispense Refill   allopurinol (ZYLOPRIM) 300 MG tablet TAKE 1 TABLET BY MOUTH EVERY DAY 90 tablet 3   Cholecalciferol 50 MCG (2000 UT) TBDP Take 2 tablets by mouth daily. 30 tablet    levothyroxine (SYNTHROID) 137 MCG tablet Take 1 tablet by mouth daily before breakfast. 30 tablet 2   oxyCODONE (OXY IR/ROXICODONE) 5 MG immediate release tablet oxycodone 5 mg tablet  Take 1 tablet twice a day by oral route as needed.     sildenafil (VIAGRA) 100 MG tablet Take 0.5-1 tablets (50-100 mg total) by mouth daily as needed for erectile dysfunction. 30 tablet 5   terbinafine (LAMISIL) 250 MG tablet Take 1 tablet (250 mg total) by mouth daily. 90 tablet 0   warfarin (COUMADIN) 5 MG tablet TAKE 2 TABLETS BY MOUTH DAILY EXCEPT TAKE 3 TABLETS ON SUN, TUES, AND THURS OR AS DIRECTED BY ANTICOAGULATION CLINIC 30 tablet 0   No facility-administered medications prior to visit.    Allergies  Allergen Reactions   Shellfish Allergy Anaphylaxis   Penicillins     REACTION: childhood, reaction unknown    ROS Review of Systems  Respiratory:  Negative for shortness of breath.   Cardiovascular:  Negative for chest pain.  Musculoskeletal:  Positive for arthralgias.     Objective:    Physical Exam Vitals reviewed.  Cardiovascular:     Rate and Rhythm: Normal rate.  Pulmonary:     Effort: Pulmonary effort is normal.     Breath sounds: Normal breath sounds.  Musculoskeletal:     Comments: Some thickening and prominence of PIP and DIP joints especially of the third digit of both hands  Neurological:     Mental Status: He is alert.    BP (!) 142/80 (BP Location: Left Arm,  Patient Position: Sitting, Cuff Size: Normal)   Pulse 81   Temp 97.8 F (36.6 C) (Oral)   Wt 239 lb 11.2 oz (108.7 kg)   SpO2 97%   BMI 31.62 kg/m  Wt Readings from Last 3 Encounters:  02/18/21 239 lb 11.2 oz (108.7 kg)  12/29/20 234 lb 11.2 oz (106.5 kg)  11/19/20 229 lb 9.6 oz (104.1 kg)     Health Maintenance Due  Topic Date Due   Hepatitis C Screening  [name] done  Zoster Vaccines- Shingrix (1 of 2) [name] done   PNA vac Low Risk Adult (1 of 2 - PCV13) [name] done    There are no preventive care reminders to display for this patient.  Lab Results  Component Value Date   TSH 1.04 12/29/2020   Lab Results  Component Value Date   WBC 6.7 06/21/2020   HGB 16.1 06/21/2020   HCT 47.1 06/21/2020   MCV 91.1 06/21/2020   PLT 189.0 06/21/2020   Lab Results  Component Value Date   NA 136 06/21/2020   K 4.0 06/21/2020   CO2 30 06/21/2020   GLUCOSE 91 06/21/2020   BUN 15 06/21/2020   CREATININE 0.84 06/21/2020   BILITOT 1.1 10/01/2020   ALKPHOS 62 10/01/2020   AST 34 10/01/2020   ALT 40 10/01/2020   PROT 7.0 10/01/2020   ALBUMIN 4.7 10/01/2020   CALCIUM 9.4 06/21/2020   GFR 88.77 06/21/2020   Lab Results  Component Value Date   CHOL 171 07/26/2020   Lab Results  Component Value Date   HDL 44 07/26/2020   Lab Results  Component Value Date   LDLCALC 88 07/26/2020   Lab Results  Component Value Date   TRIG 232 (H) 07/26/2020   Lab Results  Component Value Date   CHOLHDL 3.9 07/26/2020   Lab Results  Component Value Date   HGBA1C  02/23/2009    5.8 (NOTE) The ADA recommends the following therapeutic goal for glycemic control related to Hgb A1c measurement: Goal of therapy: <6.5 Hgb A1c  Reference: American Diabetes Association: Clinical Practice Recommendations 2010, Diabetes Care, 2010, 33: (Suppl  1).      Assessment & Plan:   #1 concern for possible adult attention deficit disorder.  Patient states he has a daughter recently diagnosed with this.   He has had longstanding history of difficulty with maintaining focus and completing task  -We discussed possible testing (for adult ADD) through our behavioral health division -Even if he does have ADD we discussed potential concerns of stimulant use at his age and with other comorbidities.  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  Recent labs per orthopedics pending and he will forward those to [place]  No orders of the defined types were placed in this encounter.   Follow-up: No follow-ups on file.    [name], MD

## 2021-02-18 NOTE — Patient Instructions (Signed)
Consider setting up consultation with Behavioral Health for ADD assessment if Dr Tenny Craw would not have any concerns for use of stimulant medication.

## 2021-02-22 ENCOUNTER — Telehealth: Payer: Self-pay | Admitting: *Deleted

## 2021-02-22 DIAGNOSIS — Z952 Presence of prosthetic heart valve: Secondary | ICD-10-CM

## 2021-02-22 DIAGNOSIS — E7211 Homocystinuria: Secondary | ICD-10-CM | POA: Diagnosis not present

## 2021-02-22 DIAGNOSIS — I359 Nonrheumatic aortic valve disorder, unspecified: Secondary | ICD-10-CM

## 2021-02-22 DIAGNOSIS — M5416 Radiculopathy, lumbar region: Secondary | ICD-10-CM | POA: Diagnosis not present

## 2021-02-22 NOTE — Telephone Encounter (Signed)
Order placed for echo. Called and let him know this has been ordered and that he will be contacted to schedule.  He is appreciative for the assistance.

## 2021-02-22 NOTE — Telephone Encounter (Signed)
-----   Message from Pricilla Riffle, MD sent at 02/21/2021  9:40 PM EDT ----- Please set up for echo to reevaluate AVR/Aorta ----- Message ----- From: Kristian Covey, MD Sent: 02/20/2021   8:07 PM EDT To: Pricilla Riffle, MD  Dr Tenny Craw, This patient has concerns about possible adult attention deficit disorder.  He is trying to decide whether to pursue psychological testing for this.   We did discuss possible concerns/side effects of stimulant use- and especially at his age.   With his hx of AVR, he wanted to get Cardiology perspective regarding risks before pursuing testing.   Thanks for any input.  Smitty Cords

## 2021-03-08 ENCOUNTER — Other Ambulatory Visit: Payer: Self-pay

## 2021-03-09 ENCOUNTER — Ambulatory Visit (INDEPENDENT_AMBULATORY_CARE_PROVIDER_SITE_OTHER): Payer: Medicare Other

## 2021-03-09 DIAGNOSIS — Z7901 Long term (current) use of anticoagulants: Secondary | ICD-10-CM

## 2021-03-09 NOTE — Patient Instructions (Addendum)
Pre visit review using our clinic review tool, if applicable. No additional management support is needed unless otherwise documented below in the visit note.  Take 3 1/2 tablets today and then continue 2 tablets daily except take 3 tablets on Wednesdays. Recheck in 4 wks.

## 2021-03-09 NOTE — Progress Notes (Signed)
I have reviewed and agree with this plan   Faithlyn Recktenwald, NP  

## 2021-03-10 ENCOUNTER — Telehealth: Payer: Self-pay | Admitting: Family Medicine

## 2021-03-10 NOTE — Telephone Encounter (Signed)
Left message for patient to call back and schedule Medicare Annual Wellness Visit (AWV) either virtually or in office. Left  my jabber number [phone]   Last AWV 03/24/20  please schedule at anytime with [facility] Nurse Health Advisor 1 or 2   This should be a 45 minute visit.  

## 2021-03-11 ENCOUNTER — Other Ambulatory Visit: Payer: Self-pay

## 2021-03-11 ENCOUNTER — Ambulatory Visit (HOSPITAL_COMMUNITY): Payer: Medicare Other | Attending: Cardiology

## 2021-03-11 DIAGNOSIS — Z952 Presence of prosthetic heart valve: Secondary | ICD-10-CM

## 2021-03-11 DIAGNOSIS — I359 Nonrheumatic aortic valve disorder, unspecified: Secondary | ICD-10-CM | POA: Diagnosis not present

## 2021-03-13 LAB — ECHOCARDIOGRAM COMPLETE
AV Mean grad: 7 mmHg
AV Peak grad: 10.5 mmHg
Ao pk vel: 1.62 m/s
Area-P 1/2: 3.81 cm2
S' Lateral: 3.4 cm

## 2021-03-14 ENCOUNTER — Encounter: Payer: Self-pay | Admitting: Family Medicine

## 2021-03-14 DIAGNOSIS — Z7901 Long term (current) use of anticoagulants: Secondary | ICD-10-CM

## 2021-03-14 MED ORDER — WARFARIN SODIUM 5 MG PO TABS: ORAL_TABLET | ORAL | 2 refills | Status: DC

## 2021-03-14 NOTE — Telephone Encounter (Signed)
Pt has been compliant with coumadin management and with PCP OV. Sent in refill.

## 2021-03-18 ENCOUNTER — Encounter: Payer: Self-pay | Admitting: Family Medicine

## 2021-03-19 DIAGNOSIS — Z20822 Contact with and (suspected) exposure to covid-19: Secondary | ICD-10-CM | POA: Diagnosis not present

## 2021-03-21 MED ORDER — AMPHETAMINE-DEXTROAMPHETAMINE 10 MG PO TABS: 10.0000 mg | ORAL_TABLET | Freq: Two times a day (BID) | ORAL | 0 refills | Status: DC

## 2021-03-21 NOTE — Telephone Encounter (Signed)
I do not see any reason we could not start low-dose Adderall.  We will start Adderall 10 mg twice daily and set up 1 month follow-up to reassess

## 2021-03-24 ENCOUNTER — Telehealth: Payer: Self-pay | Admitting: Pharmacist

## 2021-03-24 DIAGNOSIS — B351 Tinea unguium: Secondary | ICD-10-CM | POA: Diagnosis not present

## 2021-03-24 DIAGNOSIS — M79676 Pain in unspecified toe(s): Secondary | ICD-10-CM | POA: Diagnosis not present

## 2021-03-24 NOTE — Chronic Care Management (AMB) (Signed)
Chronic Care Management Pharmacy Assistant   Name: CALUM CORMIER  MRN: [id number] DOB: Jan 01, 1951  Reason for Encounter: General Assessment Call    Conditions to be addressed/monitored: HLD  Recent office visits:  03/25/21 March Rummage LPN - Patient presented for Medicare Annual Wellness exam. No medication changes.  03/09/21 Patient presented for Anti Coag Visit  02/18/21 Kristian Covey, MD - Patient presented for Attention deficit disorder and other concerns. No medication changes.   12/29/20 Burchette, Elberta Fortis, MD - Patient presented for Hypothyroidism and other concerns. No medication changes.  Recent consult visits:  None  Hospital visits:  None in previous 6 months  Medications: Outpatient Encounter Medications as of 03/24/2021  Medication Sig   allopurinol (ZYLOPRIM) 300 MG tablet TAKE 1 TABLET BY MOUTH EVERY DAY   amphetamine-dextroamphetamine (ADDERALL) 10 MG tablet Take 1 tablet (10 mg total) by mouth 2 (two) times daily.   Cholecalciferol 50 MCG (2000 UT) TBDP Take 2 tablets by mouth daily.   levothyroxine (SYNTHROID) 137 MCG tablet Take 1 tablet by mouth daily before breakfast.   oxyCODONE (OXY IR/ROXICODONE) 5 MG immediate release tablet oxycodone 5 mg tablet  Take 1 tablet twice a day by oral route as needed.   sildenafil (VIAGRA) 100 MG tablet Take 0.5-1 tablets (50-100 mg total) by mouth daily as needed for erectile dysfunction.   terbinafine (LAMISIL) 250 MG tablet Take 1 tablet (250 mg total) by mouth daily.   warfarin (COUMADIN) 5 MG tablet TAKE 2 TABLETS BY MOUTH DAILY EXCEPT TAKE 3 TABLETS ON WEDNESDAYS OR AS DIRECTED BY ANTICOAGULATION CLINIC   No facility-administered encounter medications on file as of 03/24/2021.  03/24/2021 Name: LORENE SAMAAN MRN: [id number] DOB: Mar 27, 1951 STEVENSON WINDMILLER is a 70 y.o. year old male who is a primary care patient of Burchette, Elberta Fortis, MD.  Comprehensive medication review performed; Spoke to patient regarding  cholesterol  Lipid Panel    Component Value Date/Time   CHOL 171 07/26/2020 1650   TRIG 232 (H) 07/26/2020 1650   HDL 44 07/26/2020 1650   LDLCALC 88 07/26/2020 1650   LDLDIRECT 137.9 06/21/2012 0851    10-year ASCVD risk score: The 10-year ASCVD risk score (Arnett DK, et al., 2019) is: 20.9%   Values used to calculate the score:     Age: 70 years     Sex: Male     Is Non-Hispanic African American: No     Diabetic: No     Tobacco smoker: No     Systolic Blood Pressure: 142 mmHg     Is BP treated: No     HDL Cholesterol: 44 mg/dL     Total Cholesterol: 171 mg/dL  Current antihyperlipidemic regimen:  None Previous antihyperlipidemic medications tried: none ASCVD risk enhancing conditions: age >62 What recent interventions/DTPs have been made by any provider to improve Cholesterol control since last CPP Visit: Patient reports no changes with cholesterol Any recent hospitalizations or ED visits since last visit with CPP? No What diet changes have been made to improve Cholesterol?  Patient is still adhering to keto diet and watching foods high in cholesterol What exercise is being done to improve Cholesterol?  Patient reports he plays golf and walks for 45 min increments a few times a week.  Adherence Review: Does the patient have >5 day gap between last estimated fill dates? No  Notes Patient reports that he recently started Adderall and thinks that it is working well for him, he is able to  focus when working and is able to be alert and awake during the day less tired. Other than that he reports he is doing well no changes or concerns at this time.  Care Gaps: AWV - 03/25/21 CCM-06/27/21 BP-142/80 Hepatitis C Screening - Overdue Zoster Vaccine - Overdue COVID Booster - Overdue  Star Rating Drugs: None  Pamala Duffel CMA Clinical Pharmacist Assistant [phone]

## 2021-03-25 ENCOUNTER — Ambulatory Visit (INDEPENDENT_AMBULATORY_CARE_PROVIDER_SITE_OTHER): Payer: Medicare Other

## 2021-03-25 DIAGNOSIS — Z0000 Encounter for general adult medical examination without abnormal findings: Secondary | ICD-10-CM | POA: Diagnosis not present

## 2021-03-25 NOTE — Progress Notes (Signed)
Subjective:   Samuel [name] is a 70 y.o. male who presents for an Initial Medicare Annual Wellness Visit. I connected with [name] today by telephone and verified that I am speaking with the correct person using two identifiers. Location patient: home Location provider: work Persons participating in the virtual visit: patient, provider.   I discussed the limitations, risks, security and privacy concerns of performing an evaluation and management service by telephone and the availability of in person appointments. I also discussed with the patient that there may be a patient responsible charge related to this service. The patient expressed understanding and verbally consented to this telephonic visit.    Interactive audio and video telecommunications were attempted between this provider and patient, however failed, due to patient having technical difficulties OR patient did not have access to video capability.  We continued and completed visit with audio only.     Review of Systems     Cardiac Risk Factors include: advanced age (>74men, >6 women);dyslipidemia;hypertension;male gender     Objective:    Today's Vitals   There is no height or weight on file to calculate BMI.  Advanced Directives 03/25/2021 03/24/2020  Does Patient Have a Medical Advance Directive? Yes Yes  Type of Estate agent of [place];Living will Healthcare Power of [place];Living will  Does patient want to make changes to medical advance directive? - No - Patient declined  Copy of Healthcare Power of Attorney in Chart? Yes - validated most recent copy scanned in chart (See row information) Yes - validated most recent copy scanned in chart (See row information)    Current Medications (verified) Outpatient Encounter Medications as of 03/25/2021  Medication Sig   allopurinol (ZYLOPRIM) 300 MG tablet TAKE 1 TABLET BY MOUTH EVERY DAY   amphetamine-dextroamphetamine (ADDERALL) 10 MG  tablet Take 1 tablet (10 mg total) by mouth 2 (two) times daily.   levothyroxine (SYNTHROID) 137 MCG tablet Take 1 tablet by mouth daily before breakfast.   oxyCODONE (OXY IR/ROXICODONE) 5 MG immediate release tablet oxycodone 5 mg tablet  Take 1 tablet twice a day by oral route as needed.   sildenafil (VIAGRA) 100 MG tablet Take 0.5-1 tablets (50-100 mg total) by mouth daily as needed for erectile dysfunction.   warfarin (COUMADIN) 5 MG tablet TAKE 2 TABLETS BY MOUTH DAILY EXCEPT TAKE 3 TABLETS ON WEDNESDAYS OR AS DIRECTED BY ANTICOAGULATION CLINIC   Cholecalciferol 50 MCG (2000 UT) TBDP Take 2 tablets by mouth daily. (Patient not taking: Reported on 03/25/2021)   terbinafine (LAMISIL) 250 MG tablet Take 1 tablet (250 mg total) by mouth daily. (Patient not taking: Reported on 03/25/2021)   No facility-administered encounter medications on file as of 03/25/2021.    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   Dyslipidemia    Gout    Hemorrhoids    HEMORRHOIDS-INTERNAL 12/20/2009   History of diverticulitis 03/2018   HYPERLIPIDEMIA-MIXED 04/01/2009   HYPOTHYROIDISM 08/26/2008   INSOMNIA, TRANSIENT 04/27/2009   Kidney stones    KNEE PAIN 11/02/2008   LATERAL EPICONDYLITIS 06/02/2009   Migraines    ONYCHOMYCOSIS 11/02/2008   PERSONAL HX COLONIC POLYPS 12/20/2009   Sudden visual loss 12/25/2008   TRANSIENT ISCHEMIC ATTACK 01/25/2009   Past Surgical History:  Procedure Laterality Date   CYSTECTOMY     from abdominal wall   MYRINGOTOMY WITH TUBE PLACEMENT Left  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   TONSILLECTOMY     Family History  Problem Relation Age of Onset   Diabetes Mother    Heart disease Father        valve problem   Social History   Socioeconomic History   Marital status: Married    Spouse name: Not on file   Number of children: Not  on file   Years of education: Not on file   Highest education level: Not on file  Occupational History   Not on file  Tobacco Use   Smoking status: Former   Smokeless tobacco: Never  Vaping Use   Vaping Use: Never used  Substance and Sexual Activity   Alcohol use: Yes    Alcohol/week: 14.0 standard drinks    Types: 14 Glasses of wine per week    Comment: couple glasses of wine each day   Drug use: No   Sexual activity: Not on file  Other Topics Concern   Not on file  Social History Narrative   Not on file   Social Determinants of Health   Financial Resource Strain: Low Risk    Difficulty of Paying Living Expenses: Not hard at all  Food Insecurity: No Food Insecurity   Worried About Programme researcher, broadcasting/film/video in the Last Year: Never true   Ran Out of Food in the Last Year: Never true  Transportation Needs: No Transportation Needs   Lack of Transportation (Medical): No   Lack of Transportation (Non-Medical): No  Physical Activity: Insufficiently Active   Days of Exercise per Week: 3 days   Minutes of Exercise per Session: 40 min  Stress: No Stress Concern Present   Feeling of Stress : Only a little  Social Connections: Moderately Integrated   Frequency of Communication with Friends and Family: Three times a week   Frequency of Social Gatherings with Friends and Family: Three times a week   Attends Religious Services: Never   Active Member of Clubs or Organizations: Yes   Attends Engineer, structural: More than 4 times per year   Marital Status: Married    Tobacco Counseling Counseling given: Not Answered   Clinical Intake:  Pre-visit preparation completed: Yes  Pain : 0-10 Pain Type: Chronic pain Pain Orientation: Lower Pain Radiating Towards: Oxycodone Pain Descriptors / Indicators: Constant Pain Onset: More than a month ago Pain Frequency: Constant     Nutritional Risks: None Diabetes: No  How often do you need to have someone help you when you  read instructions, pamphlets, or other written materials from your doctor or pharmacy?: 1 - Never What is the last grade level you completed in school?: college  Diabetic?no   Interpreter Needed?: No  Information entered by :: L>Renleigh Ouellet,LPn   Activities of Daily Living In your present state of health, do you have any difficulty performing the following activities: 03/25/2021  Hearing? N  Vision? N  Difficulty concentrating or making decisions? N  Walking or climbing stairs? N  Dressing or bathing? N  Doing errands, shopping? N  Preparing Food and eating ? N  Using the Toilet? N  In the past six months, have you accidently leaked urine? N  Do you have problems with loss of bowel control? N  Managing your Medications? N  Managing your Finances? N  Housekeeping or managing your Housekeeping? N  Some recent data might be hidden    Patient Care Team: Kristian Covey, MD as PCP -  General Pricilla Riffle, MD as PCP - Cardiology (Cardiology) Verner Chol, [facility] as Pharmacist (Pharmacist)  Indicate any recent Medical Services you may have received from other than [facility] providers in the past year (date may be approximate).     Assessment:   This is a routine wellness examination for Samuel [name].  Hearing/Vision screen Vision Screening - Comments:: Annual eye exams wears glasses   Dietary issues and exercise activities discussed: Current Exercise Habits: Home exercise routine, Type of exercise: walking, Time (Minutes): 40, Frequency (Times/Week): 3, Weekly Exercise (Minutes/Week): 120, Intensity: Mild, Exercise limited by: None identified   Goals Addressed             This Visit's Progress    Patient stated   On track    I will continue to play golf and will continue to walk for at least 45 minutes a few days per week.       Depression Screen PHQ 2/9 Scores 03/25/2021 03/25/2021 03/24/2020 02/13/2018 08/14/2016  PHQ - 2 Score 0 0 2 0 0  PHQ- 9 Score - - 4 - -    Fall  Risk Fall Risk  03/25/2021 03/24/2020 11/03/2019 05/05/2019 02/13/2018  Falls in the past year? 0 0 0 1 No  Comment - - - Emmi Telephone Survey: data to providers prior to load -  Number falls in past yr: 0 0 0 1 -  Comment - - - Emmi Telephone Survey Actual Response = 1 -  Injury with Fall? 0 0 0 1 -  Risk for fall due to : - No Fall Risks - - -  Follow up Falls evaluation completed Falls evaluation completed;Falls prevention discussed - - -    FALL RISK PREVENTION PERTAINING TO THE HOME:  Any stairs in or around the home? Yes  If so, are there any without handrails? No  Home free of loose throw rugs in walkways, pet beds, electrical cords, etc? Yes  Adequate lighting in your home to reduce risk of falls? Yes   ASSISTIVE DEVICES UTILIZED TO PREVENT FALLS:  Life alert? No  Use of a cane, walker or w/c? No  Grab bars in the bathroom? Yes  Shower chair or bench in shower? No  Elevated toilet seat or a handicapped toilet? No   Cognitive Function:    Normal cognitive status assessed by direct observation by this Nurse Health Advisor. No abnormalities found.   6CIT Screen 03/24/2020  What time? 0 points  Count back from 20 0 points  Months in reverse 0 points  Repeat phrase 0 points    Immunizations Immunization History  Administered Date(s) Administered   Moderna SARS-COV2 Booster Vaccination 04/08/2020   Moderna Sars-Covid-2 Vaccination 07/24/2019, 08/21/2019, 11/24/2020   Tdap 11/05/2013    TDAP status: Up to date  Flu Vaccine status: Declined, Education has been provided regarding the importance of this vaccine but patient still declined. Advised may receive this vaccine at local pharmacy or Health Dept. Aware to provide a copy of the vaccination record if obtained from local pharmacy or Health Dept. Verbalized acceptance and understanding.  Pneumococcal vaccine status: Declined,  Education has been provided regarding the importance of this vaccine but patient still  declined. Advised may receive this vaccine at local pharmacy or Health Dept. Aware to provide a copy of the vaccination record if obtained from local pharmacy or Health Dept. Verbalized acceptance and understanding.   Covid-19 vaccine status: Completed vaccines  Qualifies for Shingles Vaccine? Yes   Zostavax completed No  Shingrix Completed?: No.    Education has been provided regarding the importance of this vaccine. Patient has been advised to call insurance company to determine out of pocket expense if they have not yet received this vaccine. Advised may also receive vaccine at local pharmacy or Health Dept. Verbalized acceptance and understanding.  Screening Tests Health Maintenance  Topic Date Due   Hepatitis C Screening  Never done   Zoster Vaccines- Shingrix (1 of 2) Never done   COVID-19 Vaccine (5 - Booster for Moderna series) 03/26/2021   TETANUS/TDAP  11/06/2023   COLONOSCOPY (Pts 45-29yrs Insurance coverage will need to be confirmed)  10/21/2024   HPV VACCINES  Aged Out   INFLUENZA VACCINE  Discontinued    Health Maintenance  Health Maintenance Due  Topic Date Due   Hepatitis C Screening  Never done   Zoster Vaccines- Shingrix (1 of 2) Never done   COVID-19 Vaccine (5 - Booster for Moderna series) 03/26/2021    Colorectal cancer screening: Type of screening: Colonoscopy. Completed 10/22/2014. Repeat every 10 years  Lung Cancer Screening: (Low Dose CT Chest recommended if Age 79-80 years, 30 pack-year currently smoking OR have quit w/in 15years.) does not qualify.   Lung Cancer Screening Referral: n/a  Additional Screening:  Hepatitis C Screening: does qualify;   Vision Screening: Recommended annual ophthalmology exams for early detection of glaucoma and other disorders of the eye. Is the patient up to date with their annual eye exam?  Yes  Who is the provider or what is the name of the office in which the patient attends annual eye exams? [facility]  If pt is  not established with a provider, would they like to be referred to a provider to establish care? No .   Dental Screening: Recommended annual dental exams for proper oral hygiene  Community Resource Referral / Chronic Care Management: CRR required this visit?  No   CCM required this visit?  No      Plan:     I have personally reviewed and noted the following in the patient's chart:   Medical and social history Use of alcohol, tobacco or illicit drugs  Current medications and supplements including opioid prescriptions. Patient is currently taking opioid prescriptions. Information provided to patient regarding non-opioid alternatives. Patient advised to discuss non-opioid treatment plan with their provider. Functional ability and status Nutritional status Physical activity Advanced directives List of other physicians Hospitalizations, surgeries, and ER visits in previous 12 months Vitals Screenings to include cognitive, depression, and falls Referrals and appointments  In addition, I have reviewed and discussed with patient certain preventive protocols, quality metrics, and best practice recommendations. A written personalized care plan for preventive services as well as general preventive health recommendations were provided to patient.     March Rummage, LPN   [id number]   Nurse Notes: none

## 2021-03-25 NOTE — Patient Instructions (Signed)
Mr. Samuel [name] , Thank you for taking time to come for your Medicare Wellness Visit. I appreciate your ongoing commitment to your health goals. Please review the following plan we discussed and let me know if I can assist you in the future.   Screening recommendations/referrals: Colonoscopy: 10/22/2014  due 2026 Recommended yearly ophthalmology/optometry visit for glaucoma screening and checkup Recommended yearly dental visit for hygiene and checkup  Vaccinations: Influenza vaccine: declined  Pneumococcal vaccine: due  Tdap vaccine: 11/05/2013 Shingles vaccine: will consider     Advanced directives: copies in chart   Conditions/risks identified: none   Next appointment: Patient will call to make appointment   Preventive Care 65 Years and Older, Male Preventive care refers to lifestyle choices and visits with your health care provider that can promote health and wellness. What does preventive care include? A yearly physical exam. This is also called an annual well check. Dental exams once or twice a year. Routine eye exams. Ask your health care provider how often you should have your eyes checked. Personal lifestyle choices, including: Daily care of your teeth and gums. Regular physical activity. Eating a healthy diet. Avoiding tobacco and drug use. Limiting alcohol use. Practicing safe sex. Taking low doses of aspirin every day. Taking vitamin and mineral supplements as recommended by your health care provider. What happens during an annual well check? The services and screenings done by your health care provider during your annual well check will depend on your age, overall health, lifestyle risk factors, and family history of disease. Counseling  Your health care provider may ask you questions about your: Alcohol use. Tobacco use. Drug use. Emotional well-being. Home and relationship well-being. Sexual activity. Eating habits. History of falls. Memory and ability to  understand (cognition). Work and work Astronomer. Screening  You may have the following tests or measurements: Height, weight, and BMI. Blood pressure. Lipid and cholesterol levels. These may be checked every 5 years, or more frequently if you are over 67 years old. Skin check. Lung cancer screening. You may have this screening every year starting at age 81 if you have a 30-pack-year history of smoking and currently smoke or have quit within the past 15 years. Fecal occult blood test (FOBT) of the stool. You may have this test every year starting at age 60. Flexible sigmoidoscopy or colonoscopy. You may have a sigmoidoscopy every 5 years or a colonoscopy every 10 years starting at age 19. Prostate cancer screening. Recommendations will vary depending on your family history and other risks. Hepatitis C blood test. Hepatitis B blood test. Sexually transmitted disease (STD) testing. Diabetes screening. This is done by checking your blood sugar (glucose) after you have not eaten for a while (fasting). You may have this done every 1-3 years. Abdominal aortic aneurysm (AAA) screening. You may need this if you are a current or former smoker. Osteoporosis. You may be screened starting at age 24 if you are at high risk. Talk with your health care provider about your test results, treatment options, and if necessary, the need for more tests. Vaccines  Your health care provider may recommend certain vaccines, such as: Influenza vaccine. This is recommended every year. Tetanus, diphtheria, and acellular pertussis (Tdap, Td) vaccine. You may need a Td booster every 10 years. Zoster vaccine. You may need this after age 43. Pneumococcal 13-valent conjugate (PCV13) vaccine. One dose is recommended after age 44. Pneumococcal polysaccharide (PPSV23) vaccine. One dose is recommended after age 39. Talk to your health care provider about  which screenings and vaccines you need and how often you need them. This  information is not intended to replace advice given to you by your health care provider. Make sure you discuss any questions you have with your health care provider. Document Released: 06/25/2015 Document Revised: 02/16/2016 Document Reviewed: 03/30/2015 Elsevier Interactive Patient Education  2017 [employer] Prevention in the Home Falls can cause injuries. They can happen to people of all ages. There are many things you can do to make your home safe and to help prevent falls. What can I do on the outside of my home? Regularly fix the edges of walkways and driveways and fix any cracks. Remove anything that might make you trip as you walk through a door, such as a raised step or threshold. Trim any bushes or trees on the path to your home. Use bright outdoor lighting. Clear any walking paths of anything that might make someone trip, such as rocks or tools. Regularly check to see if handrails are loose or broken. Make sure that both sides of any steps have handrails. Any raised decks and porches should have guardrails on the edges. Have any leaves, snow, or ice cleared regularly. Use sand or salt on walking paths during winter. Clean up any spills in your garage right away. This includes oil or grease spills. What can I do in the bathroom? Use night lights. Install grab bars by the toilet and in the tub and shower. Do not use towel bars as grab bars. Use non-skid mats or decals in the tub or shower. If you need to sit down in the shower, use a plastic, non-slip stool. Keep the floor dry. Clean up any water that spills on the floor as soon as it happens. Remove soap buildup in the tub or shower regularly. Attach bath mats securely with double-sided non-slip rug tape. Do not have throw rugs and other things on the floor that can make you trip. What can I do in the bedroom? Use night lights. Make sure that you have a light by your bed that is easy to reach. Do not use any sheets or  blankets that are too big for your bed. They should not hang down onto the floor. Have a firm chair that has side arms. You can use this for support while you get dressed. Do not have throw rugs and other things on the floor that can make you trip. What can I do in the kitchen? Clean up any spills right away. Avoid walking on wet floors. Keep items that you use a lot in easy-to-reach places. If you need to reach something above you, use a strong step stool that has a grab bar. Keep electrical cords out of the way. Do not use floor polish or wax that makes floors slippery. If you must use wax, use non-skid floor wax. Do not have throw rugs and other things on the floor that can make you trip. What can I do with my stairs? Do not leave any items on the stairs. Make sure that there are handrails on both sides of the stairs and use them. Fix handrails that are broken or loose. Make sure that handrails are as long as the stairways. Check any carpeting to make sure that it is firmly attached to the stairs. Fix any carpet that is loose or worn. Avoid having throw rugs at the top or bottom of the stairs. If you do have throw rugs, attach them to the floor with  carpet tape. Make sure that you have a light switch at the top of the stairs and the bottom of the stairs. If you do not have them, ask someone to add them for you. What else can I do to help prevent falls? Wear shoes that: Do not have high heels. Have rubber bottoms. Are comfortable and fit you well. Are closed at the toe. Do not wear sandals. If you use a stepladder: Make sure that it is fully opened. Do not climb a closed stepladder. Make sure that both sides of the stepladder are locked into place. Ask someone to hold it for you, if possible. Clearly mark and make sure that you can see: Any grab bars or handrails. First and last steps. Where the edge of each step is. Use tools that help you move around (mobility aids) if they are  needed. These include: Canes. Walkers. Scooters. Crutches. Turn on the lights when you go into a dark area. Replace any light bulbs as soon as they burn out. Set up your furniture so you have a clear path. Avoid moving your furniture around. If any of your floors are uneven, fix them. If there are any pets around you, be aware of where they are. Review your medicines with your doctor. Some medicines can make you feel dizzy. This can increase your chance of falling. Ask your doctor what other things that you can do to help prevent falls. This information is not intended to replace advice given to you by your health care provider. Make sure you discuss any questions you have with your health care provider. Document Released: 03/25/2009 Document Revised: 11/04/2015 Document Reviewed: 07/03/2014 Elsevier Interactive Patient Education  2017 [employer].

## 2021-04-05 ENCOUNTER — Other Ambulatory Visit: Payer: Self-pay

## 2021-04-06 ENCOUNTER — Ambulatory Visit (INDEPENDENT_AMBULATORY_CARE_PROVIDER_SITE_OTHER): Payer: Medicare Other

## 2021-04-06 DIAGNOSIS — Z7901 Long term (current) use of anticoagulants: Secondary | ICD-10-CM | POA: Diagnosis not present

## 2021-04-06 LAB — POCT INR: INR: 1.3 — AB (ref 2.0–3.0)

## 2021-04-06 NOTE — Patient Instructions (Addendum)
Pre visit review using our clinic review tool, if applicable. No additional management support is needed unless otherwise documented below in the visit note.  Take 4 tablets today and take 3 tablets tomorrow and the change weekly dose to take 2 tablets daily except take 3 tablets on Mon, Wed and Fri. Recheck in 2 wks.

## 2021-04-11 ENCOUNTER — Other Ambulatory Visit: Payer: Self-pay | Admitting: Family Medicine

## 2021-04-19 ENCOUNTER — Other Ambulatory Visit: Payer: Self-pay

## 2021-04-20 ENCOUNTER — Ambulatory Visit (INDEPENDENT_AMBULATORY_CARE_PROVIDER_SITE_OTHER): Payer: Medicare Other

## 2021-04-20 DIAGNOSIS — Z7901 Long term (current) use of anticoagulants: Secondary | ICD-10-CM

## 2021-04-20 LAB — POCT INR: INR: 2 (ref 2.0–3.0)

## 2021-04-20 MED ORDER — WARFARIN SODIUM 5 MG PO TABS: ORAL_TABLET | ORAL | 1 refills | Status: DC

## 2021-04-20 NOTE — Progress Notes (Signed)
Change weekly dose to take 3 tablets daily except take 2 tablets on Wednesdays. Recheck in 3 wks.

## 2021-04-20 NOTE — Patient Instructions (Addendum)
Pre visit review using our clinic review tool, if applicable. No additional management support is needed unless otherwise documented below in the visit note.  Change weekly dose to take 3 tablets daily except take 2 tablets on Wednesdays. Recheck in 3 wks.

## 2021-05-11 ENCOUNTER — Ambulatory Visit: Payer: Medicare Other | Admitting: Family Medicine

## 2021-05-11 ENCOUNTER — Encounter: Payer: Self-pay | Admitting: Family Medicine

## 2021-05-11 ENCOUNTER — Ambulatory Visit (INDEPENDENT_AMBULATORY_CARE_PROVIDER_SITE_OTHER): Payer: Medicare Other

## 2021-05-11 VITALS — BP 142/90 | HR 82 | Temp 98.2°F | Resp 18 | Wt 237.0 lb

## 2021-05-11 DIAGNOSIS — F988 Other specified behavioral and emotional disorders with onset usually occurring in childhood and adolescence: Secondary | ICD-10-CM

## 2021-05-11 DIAGNOSIS — R030 Elevated blood-pressure reading, without diagnosis of hypertension: Secondary | ICD-10-CM

## 2021-05-11 DIAGNOSIS — Z7901 Long term (current) use of anticoagulants: Secondary | ICD-10-CM | POA: Diagnosis not present

## 2021-05-11 LAB — POCT INR: INR: 3.6 — AB (ref 2.0–3.0)

## 2021-05-11 MED ORDER — AMPHETAMINE-DEXTROAMPHETAMINE 10 MG PO TABS: 10.0000 mg | ORAL_TABLET | Freq: Two times a day (BID) | ORAL | 0 refills | Status: DC

## 2021-05-11 NOTE — Progress Notes (Signed)
Established Patient Office Visit  Subjective:  Patient ID: Samuel [name], male    DOB: Mar 10, 1951  Age: 70 y.o. MRN: [id number]  CC:  Chief Complaint  Patient presents with   Medication Refill    Pt would like to discuss adderall, curious to know if it is the cuase of higher blood pressure     HPI Samuel [name] presents for medication follow-up.  We had recently initiated Adderall 10 mg twice daily.  He states that he had some insomnia when taking this twice daily but not once daily.  Thus, he has been taking this once daily.  He has not seen much improvement in focus but has seen some improvement in overall energy and feels much better overall when taking the low-dose Adderall in the morning.  Does have slightly elevated blood pressure today.  Does not monitor this regularly at home.  He plans to start exercising more consistently.  No recent chest pains.  No headaches.  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   Dyslipidemia    Gout    Hemorrhoids    HEMORRHOIDS-INTERNAL 12/20/2009   History of diverticulitis 03/2018   HYPERLIPIDEMIA-MIXED 04/01/2009   HYPOTHYROIDISM 08/26/2008   INSOMNIA, TRANSIENT 04/27/2009   Kidney stones    KNEE PAIN 11/02/2008   LATERAL EPICONDYLITIS 06/02/2009   Migraines    ONYCHOMYCOSIS 11/02/2008   PERSONAL HX COLONIC POLYPS 12/20/2009   Sudden visual loss 12/25/2008   TRANSIENT ISCHEMIC ATTACK 01/25/2009    Past Surgical History:  Procedure Laterality Date   CYSTECTOMY     from abdominal wall   MYRINGOTOMY WITH TUBE PLACEMENT Left    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   TONSILLECTOMY      Family History  Problem Relation Age of Onset   Diabetes Mother    Heart disease Father        valve problem    Social History   Socioeconomic History   Marital status: Married    Spouse name: Not on file   Number of children: Not on file   Years of education: Not on file    Highest education level: Not on file  Occupational History   Not on file  Tobacco Use   Smoking status: Former   Smokeless tobacco: Never  Vaping Use   Vaping Use: Never used  Substance and Sexual Activity   Alcohol use: Yes    Alcohol/week: 14.0 standard drinks    Types: 14 Glasses of wine per week    Comment: couple glasses of wine each day   Drug use: No   Sexual activity: Not on file  Other Topics Concern   Not on file  Social History Narrative   Not on file   Social Determinants of Health   Financial Resource Strain: Low Risk    Difficulty of Paying Living Expenses: Not hard at all  Food Insecurity: No Food Insecurity   Worried About Charity fundraiser in the Last Year: Never true   [place] in the Last Year: Never true  Transportation Needs: No Transportation Needs   Lack of Transportation (Medical): No   Lack of Transportation (Non-Medical): No  Physical Activity: Insufficiently Active   Days of Exercise per Week: 3 days   Minutes of Exercise per Session: 40 min  Stress: No Stress Concern Present   Feeling of Stress : Only a little  Social Connections: Moderately Integrated   Frequency of Communication with Friends and Family: Three times a week   Frequency of Social Gatherings with Friends and Family: Three times a week   Attends Religious Services: Never   Active Member of Clubs or Organizations: Yes   Attends Engineer, structural: More than 4 times per year   Marital Status: Married  Catering manager Violence: Not At Risk   Fear of Current or Ex-Partner: No   Emotionally Abused: No   Physically Abused: No   Sexually Abused: No    Outpatient Medications Prior to Visit  Medication Sig Dispense Refill   allopurinol (ZYLOPRIM) 300 MG tablet TAKE 1 TABLET BY MOUTH EVERY DAY 90 tablet 3   Cholecalciferol 50 MCG (2000 UT) TBDP Take 2 tablets by mouth daily. 30 tablet    levothyroxine (SYNTHROID) 137 MCG tablet TAKE 1 TABLET BY MOUTH EVERY  MORNING BEFORE breakfast 30 tablet 2   oxyCODONE (OXY IR/ROXICODONE) 5 MG immediate release tablet oxycodone 5 mg tablet  Take 1 tablet twice a day by oral route as needed.     terbinafine (LAMISIL) 250 MG tablet Take 1 tablet (250 mg total) by mouth daily. 90 tablet 0   warfarin (COUMADIN) 5 MG tablet TAKE 3 TABLETS BY MOUTH DAILY EXCEPT TAKE 2 TABLETS ON WEDNESDAYS OR AS DIRECTED BY ANTICOAGULATION CLINIC 120 tablet 1   amphetamine-dextroamphetamine (ADDERALL) 10 MG tablet Take 1 tablet (10 mg total) by mouth 2 (two) times daily. 60 tablet 0   sildenafil (VIAGRA) 100 MG tablet Take 0.5-1 tablets (50-100 mg total) by mouth daily as needed for erectile dysfunction. 30 tablet 5   No facility-administered medications prior to visit.    Allergies  Allergen Reactions   Shellfish Allergy Anaphylaxis   Penicillins     REACTION: childhood, reaction unknown    ROS Review of Systems  Constitutional:  Negative for fatigue.  Eyes:  Negative for visual disturbance.  Respiratory:  Negative for cough, chest tightness and shortness of breath.   Cardiovascular:  Negative for chest pain, palpitations and leg swelling.  Neurological:  Negative for dizziness, syncope, weakness, light-headedness and headaches.     Objective:    Physical Exam Constitutional:      Appearance: He is well-developed.  HENT:     Right Ear: External ear normal.     Left Ear: External ear normal.  Eyes:     Pupils: Pupils are equal, round, and reactive to light.  Neck:     Thyroid: No thyromegaly.  Cardiovascular:     Rate and Rhythm: Normal rate and regular rhythm.  Pulmonary:     Effort: Pulmonary effort is normal. No respiratory distress.     Breath sounds: Normal breath sounds. No wheezing or rales.  Musculoskeletal:     Cervical back: Neck supple.  Neurological:     Mental Status: He is alert and oriented to person, place, and time.    BP (!) 142/90 (BP Location: Left Arm, Patient Position: Sitting, Cuff  Size: Large)   Pulse 82   Temp 98.2 F (36.8 C) (Oral)   Resp 18   Wt 237 lb (107.5 kg)   SpO2 96%   BMI 31.27 kg/m  Wt Readings from Last 3 Encounters:  05/11/21 237 lb (107.5 kg)  02/18/21 239 lb 11.2 oz (108.7 kg)  12/29/20 234 lb 11.2 oz (106.5 kg)     Health Maintenance Due  Topic Date Due   Pneumonia Vaccine 89+ Years old (1 - PCV)  Never done   Hepatitis C Screening  Never done   Zoster Vaccines- Shingrix (1 of 2) Never done   COVID-19 Vaccine (4 - Booster for Moderna series) 01/19/2021    There are no preventive care reminders to display for this patient.  Lab Results  Component Value Date   TSH 1.04 12/29/2020   Lab Results  Component Value Date   WBC 6.7 06/21/2020   HGB 16.1 06/21/2020   HCT 47.1 06/21/2020   MCV 91.1 06/21/2020   PLT 189.0 06/21/2020   Lab Results  Component Value Date   NA 136 06/21/2020   K 4.0 06/21/2020   CO2 30 06/21/2020   GLUCOSE 91 06/21/2020   BUN 15 06/21/2020   CREATININE 0.84 06/21/2020   BILITOT 1.1 10/01/2020   ALKPHOS 62 10/01/2020   AST 34 10/01/2020   ALT 40 10/01/2020   PROT 7.0 10/01/2020   ALBUMIN 4.7 10/01/2020   CALCIUM 9.4 06/21/2020   GFR 88.77 06/21/2020   Lab Results  Component Value Date   CHOL 171 07/26/2020   Lab Results  Component Value Date   HDL 44 07/26/2020   Lab Results  Component Value Date   LDLCALC 88 07/26/2020   Lab Results  Component Value Date   TRIG 232 (H) 07/26/2020   Lab Results  Component Value Date   CHOLHDL 3.9 07/26/2020   Lab Results  Component Value Date   HGBA1C  02/23/2009    5.8 (NOTE) The ADA recommends the following therapeutic goal for glycemic control related to Hgb A1c measurement: Goal of therapy: <6.5 Hgb A1c  Reference: American Diabetes Association: Clinical Practice Recommendations 2010, Diabetes Care, 2010, 33: (Suppl  1).      Assessment & Plan:   #1 attention deficit disorder.  Patient has not seen much improvement (in focus) with  low-dose Adderall but overall more energy levels.  We did discuss possible titration to 20 mg would have to watch blood pressure closely and would have to watch for insomnia issues.  Given the fact that his blood pressure was up slightly today we have elected not to titrate up at this point.  Refilled his prescription  #2 elevated blood pressure.  No history of treated hypertension. -We discussed potential initiation of medication at this time versus monitoring and he prefers to work on weight loss, establishing more consistent aerobic exercise, and reassess within a couple to a few months.  Handout on DASH diet given.  If BP not improved at that time consider probably ACE or ARB.  Meds ordered this encounter  Medications   amphetamine-dextroamphetamine (ADDERALL) 10 MG tablet    Sig: Take 1 tablet (10 mg total) by mouth 2 (two) times daily.    Dispense:  60 tablet    Refill:  0    Follow-up: Return in about 3 months (around 08/09/2021).    Carolann Littler, MD

## 2021-05-11 NOTE — Patient Instructions (Addendum)
Pre visit review using our clinic review tool, if applicable. No additional management support is needed unless otherwise documented below in the visit note.  Only take 1 tablet today and then change weekly dose to take 3 tablets daily except take 2 tablets on Wednesdays and Saturdays. Recheck in 4 wks.

## 2021-05-11 NOTE — Progress Notes (Signed)
Only take 1 tablet today and then change weekly dose to take 3 tablets daily except take 2 tablets on Wednesdays and Saturdays. Recheck in 4 wks.

## 2021-06-08 ENCOUNTER — Ambulatory Visit (INDEPENDENT_AMBULATORY_CARE_PROVIDER_SITE_OTHER): Payer: Medicare Other

## 2021-06-08 DIAGNOSIS — Z7901 Long term (current) use of anticoagulants: Secondary | ICD-10-CM

## 2021-06-08 LAB — POCT INR: INR: 3.9 — AB (ref 2.0–3.0)

## 2021-06-08 NOTE — Progress Notes (Signed)
Hold dose today and then change weekly dose to take 2 tablets daily except take 3 tablets on Wednesdays.  Recheck in 2 wks.

## 2021-06-08 NOTE — Patient Instructions (Addendum)
Pre visit review using our clinic review tool, if applicable. No additional management support is needed unless otherwise documented below in the visit note.  Hold dose today and then change weekly dose to take 2 tablets daily except take 3 tablets on Wednesdays.  Recheck in 2 wks.

## 2021-06-16 ENCOUNTER — Encounter: Payer: Self-pay | Admitting: Family Medicine

## 2021-06-16 MED ORDER — AMPHETAMINE-DEXTROAMPHETAMINE 10 MG PO TABS: 10.0000 mg | ORAL_TABLET | Freq: Two times a day (BID) | ORAL | 0 refills | Status: DC

## 2021-06-22 ENCOUNTER — Telehealth: Payer: Self-pay | Admitting: Pharmacist

## 2021-06-22 ENCOUNTER — Ambulatory Visit: Payer: Medicare Other

## 2021-06-22 NOTE — Chronic Care Management (AMB) (Signed)
° ° °  Chronic Care Management Pharmacy Assistant   Name: CHAOS CARLILE  MRN: [id number] DOB: [birth year]  06/22/21 APPOINTMENT REMINDER   Called Patient No answer, left message of appointment on 06/27/21 at 1 via telephone visit with Gaylord Shih, Pharm D.   Notified to have all medications, supplements, blood pressure and/or blood sugar logs available during appointment and to return call if need to reschedule.     Care Gaps: AWV - 03/25/21 CCM-06/27/21 BP-142/80 Hepatitis C Screening - Overdue Zoster Vaccine - Overdue COVID Booster - Overdue  Star Rating Drug: None   Any gaps in medications fill history? None      Medications: Outpatient Encounter Medications as of 06/22/2021  Medication Sig   allopurinol (ZYLOPRIM) 300 MG tablet TAKE 1 TABLET BY MOUTH EVERY DAY   amphetamine-dextroamphetamine (ADDERALL) 10 MG tablet Take 1 tablet (10 mg total) by mouth 2 (two) times daily.   Cholecalciferol 50 MCG (2000 UT) TBDP Take 2 tablets by mouth daily.   levothyroxine (SYNTHROID) 137 MCG tablet TAKE 1 TABLET BY MOUTH EVERY MORNING BEFORE breakfast   oxyCODONE (OXY IR/ROXICODONE) 5 MG immediate release tablet oxycodone 5 mg tablet  Take 1 tablet twice a day by oral route as needed.   sildenafil (VIAGRA) 100 MG tablet Take 0.5-1 tablets (50-100 mg total) by mouth daily as needed for erectile dysfunction.   terbinafine (LAMISIL) 250 MG tablet Take 1 tablet (250 mg total) by mouth daily.   warfarin (COUMADIN) 5 MG tablet TAKE 3 TABLETS BY MOUTH DAILY EXCEPT TAKE 2 TABLETS ON WEDNESDAYS OR AS DIRECTED BY ANTICOAGULATION CLINIC   No facility-administered encounter medications on file as of 06/22/2021.       Pamala Duffel CMA Clinical Pharmacist Assistant [phone]

## 2021-06-27 ENCOUNTER — Ambulatory Visit (INDEPENDENT_AMBULATORY_CARE_PROVIDER_SITE_OTHER): Payer: Medicare Other | Admitting: Pharmacist

## 2021-06-27 ENCOUNTER — Ambulatory Visit (INDEPENDENT_AMBULATORY_CARE_PROVIDER_SITE_OTHER): Payer: Medicare Other

## 2021-06-27 DIAGNOSIS — Z7901 Long term (current) use of anticoagulants: Secondary | ICD-10-CM | POA: Diagnosis not present

## 2021-06-27 DIAGNOSIS — E039 Hypothyroidism, unspecified: Secondary | ICD-10-CM

## 2021-06-27 DIAGNOSIS — R41840 Attention and concentration deficit: Secondary | ICD-10-CM

## 2021-06-27 LAB — POCT INR: INR: 2.1 (ref 2.0–3.0)

## 2021-06-27 NOTE — Progress Notes (Signed)
Continue 2 tablets daily except take 3 tablets on Wednesdays.  Recheck in 4 wks.

## 2021-06-27 NOTE — Patient Instructions (Signed)
Hi Samuel [name],  It was great to catch up with you again! Don't forget to look into getting your shingles shot at the pharmacy and pneumonia shot either at the pharmacy or in the office. For the pneumonia, you want to ask for Prevnar 20 because that's the one and done.  Please reach out to me if you have any questions or need anything before our follow up!  Best, Maddie  Gaylord Shih, PharmD, [facility] Clinical Pharmacist [facility] [place] [phone]  Visit Information   Goals Addressed   None    Patient Care Plan: CCM Pharmacy Care Plan     Problem Identified: Problem: Hyperlipidemia, Hypothyroidism, Gout, and Aortic valve replacement, Vitamin D deficiency      Long-Range Goal: Patient-Specific Goal   Start Date: 12/27/2020  Expected End Date: 12/27/2021  Recent Progress: On track  Priority: High  Note:   Current Barriers:  Unable to independently monitor therapeutic efficacy  Pharmacist Clinical Goal(s):  Patient will achieve adherence to monitoring guidelines and medication adherence to achieve therapeutic efficacy maintain control of hypothyroidism as evidenced by TSH  through collaboration with PharmD and provider.   Interventions: 1:1 collaboration with Kristian Covey, MD regarding development and update of comprehensive plan of care as evidenced by provider attestation and co-signature Inter-disciplinary care team collaboration (see longitudinal plan of care) Comprehensive medication review performed; medication list updated in electronic medical record  Hyperlipidemia: (LDL goal < 100) -Controlled  -Current treatment: No medications -Medications previously tried: none -Current dietary patterns: following a keto diet -Current exercise habits: plays golf but no formal exercise -Educated on Cholesterol goals;  Importance of limiting foods high in cholesterol; Exercise goal of 150 minutes per week; -Counseled on diet and exercise  extensively Consider statin therapy based on ASCVD risk and elevated triglycerides  Hypothyroidsim (Goal: TSH 0.35-4.5) -Controlled  -Current treatment  Levothyroxine 137 mcg 1 tablet once daily - appropriate, effective, safe, accessible -Medications previously tried: none  -Recommended to continue current medication Educated on importance of being consistent with taking this medication and separating from warfarin.  Gout (Goal: prevent flare ups, uric acid < 6) -Controlled -Current treatment  Allopurinol 300 mg  1 tablet daily - appropriate, effective, safe, accessible -Medications previously tried: none  -Recommended to continue current medication Counseled on foods with high purine content or foods that don't allow for purines to be processes effectively Recommended repeat uric acid level  Vitamin D deficency (Goal: vitamin D 30-100) -Uncontrolled -Current treatment  Vitamin D 2000 units daily - appropriate, query effective -Medications previously tried: none  -Counseled on symptoms of low vitamin D and the recommendations for supplementation as he was unaware to start. Recommended taking with food. Schedule office visit with PCP to get lab work for repeat vitamin D level.  Aortic valve replacement (Goal: prevent blood clots) -Controlled -Current treatment  Warfarin 5 mg 1 tablet as directed by the coumadin clinic - appropriate, effective, safe, accessible -Medications previously tried: none  -Counseled on diet and exercise extensively Recommended to continue current medication Counseled on consistent intake of greens from week to week. Recommended moving to the evening time for ease of dose adjustments and separating from levothyroxine.  Pain (Goal: minimize pain) -Controlled -Current treatment  Oxycodone 5 mg 1 tablet twice daily as needed (taking 1/2 before golf and 1/2 tablet after) - appropriate, effective, safe, accessible -Medications previously tried: none   -Counseled on limiting use  Attention deficit (Goal: minimize daytime fatigue) -Controlled -Current treatment  Adderall 10 mg  1 tablet twice daily (taking once daily) - - appropriate, effective, safe, accessible -Medications previously tried: none  -Recommended to continue current medication   Health Maintenance -Vaccine gaps: shingrix, Prevnar -Current therapy:  none -Educated on Cost vs benefit of each product must be carefully weighed by individual consumer -Patient is satisfied with current therapy and denies issues -Recommended to continue as is.  Patient Goals/Self-Care Activities Patient will:  - take medications as prescribed target a minimum of 150 minutes of moderate intensity exercise weekly  Follow Up Plan: Telephone follow up appointment with care management team member scheduled for: 1 year       Patient verbalizes understanding of instructions and care plan provided today and agrees to view in MyChart. Active MyChart status confirmed with patient.   Telephone follow up appointment with pharmacy team member scheduled for: 1 year  Verner Chol, [facility]

## 2021-06-27 NOTE — Patient Instructions (Addendum)
Pre visit review using our clinic review tool, if applicable. No additional management support is needed unless otherwise documented below in the visit note.  Continue 2 tablets daily except take 3 tablets on Wednesdays.  Recheck in 4 wks.

## 2021-06-27 NOTE — Progress Notes (Signed)
Chronic Care Management Pharmacy Note  06/27/2021 Name:  Samuel [name] MRN:  [id number] DOB:  [birth year]  Summary: Vitamin D not at goal 30-100  Recommendations/Changes made from today's visit: -Recommend repeat vitamin D level  -Consider home BP monitoring based on elevated office readings x 2  Plan: General assessment in 4-5 months   Subjective: Samuel [name] is an 71 y.o. year old male who is a primary patient of Burchette, Alinda Sierras, MD.  The CCM team was consulted for assistance with disease management and care coordination needs.    Engaged with patient by telephone for follow up visit in response to provider referral for pharmacy case management and/or care coordination services.   Consent to Services:  The patient was given information about Chronic Care Management services, agreed to services, and gave verbal consent prior to initiation of services.  Please see initial visit note for detailed documentation.   Patient Care Team: Eulas Post, MD as PCP - Desma Paganini, MD as PCP - Cardiology (Cardiology) Viona Gilmore, [facility] as Pharmacist (Pharmacist)  Recent office visits: 06/08/21 Randall An, RN : seen for anticoagulation office visit. INR 3.9, goal 2-3. Held dose x 1 days then decreased to 15 mg (5 mg x 3) every Wed; 10 mg (5 mg x 2) all other days.   05/11/21 Carolann Littler MD: Patient presented for ADHD follow up. Decreased Adderall on his own to once daily due to insomnia with twice daily. BP elevated. Follow up in 3 months.  03/25/21 Randel Pigg, LPN: Patient presented for AWV.   02/18/21 Burchette, Alinda Sierras, MD - Patient presented for Attention deficit disorder and other concerns. No medication changes.    12/29/20 Burchette, Alinda Sierras, MD - Patient presented for Hypothyroidism and other concerns. No medication changes.   Recent consult visits: 02/22/21 Levy Pupa, PA ([facility]): Patient presented for lumbar radiculopathy follow up. Unable  to access notes.   Hospital visits: None in previous 6 months   Objective:  Lab Results  Component Value Date   CREATININE 0.84 06/21/2020   BUN 15 06/21/2020   GFR 88.77 06/21/2020   GFRNONAA >60 02/27/2009   GFRAA  02/27/2009    >60        The eGFR has been calculated using the MDRD equation. This calculation has not been validated in all clinical situations. eGFR's persistently <60 mL/min signify possible Chronic Kidney Disease.   NA 136 06/21/2020   K 4.0 06/21/2020   CALCIUM 9.4 06/21/2020   CO2 30 06/21/2020   GLUCOSE 91 06/21/2020    Lab Results  Component Value Date/Time   HGBA1C  02/23/2009 10:07 AM    5.8 (NOTE) The ADA recommends the following therapeutic goal for glycemic control related to Hgb A1c measurement: Goal of therapy: <6.5 Hgb A1c  Reference: American Diabetes Association: Clinical Practice Recommendations 2010, Diabetes Care, 2010, 33: (Suppl  1).   GFR 88.77 06/21/2020 02:59 PM   GFR 87.11 03/27/2019 03:35 PM    Last diabetic Eye exam: No results found for: HMDIABEYEEXA  Last diabetic Foot exam: No results found for: HMDIABFOOTEX   Lab Results  Component Value Date   CHOL 171 07/26/2020   HDL 44 07/26/2020   LDLCALC 88 07/26/2020   LDLDIRECT 137.9 06/21/2012   TRIG 232 (H) 07/26/2020   CHOLHDL 3.9 07/26/2020    Hepatic Function Latest Ref Rng & Units 10/01/2020 06/21/2020 05/21/2019  Total Protein 6.0 - 8.3 g/dL 7.0 7.2 6.3  Albumin 3.5 -  5.2 g/dL 4.7 5.0 4.3  AST 0 - 37 U/L 34 33 69(H)  ALT 0 - 53 U/L 40 36 119(H)  Alk Phosphatase 39 - 117 U/L 62 67 60  Total Bilirubin 0.2 - 1.2 mg/dL 1.1 1.2 1.1  Bilirubin, Direct 0.0 - 0.3 mg/dL 0.2 - 0.2    Lab Results  Component Value Date/Time   TSH 1.04 12/29/2020 11:54 AM   TSH 1.85 10/01/2020 01:55 PM   FREET4 1.18 10/01/2020 01:55 PM    CBC Latest Ref Rng & Units 06/21/2020 07/21/2019 03/27/2019  WBC 4.0 - 10.5 K/uL 6.7 7.8 4.9  Hemoglobin 13.0 - 17.0 g/dL 16.1 16.0 16.7  Hematocrit  39.0 - 52.0 % 47.1 46.3 48.1  Platelets 150.0 - 400.0 K/uL 189.0 158.0 175.0    Lab Results  Component Value Date/Time   VD25OH 18.43 (L) 12/29/2020 11:54 AM   VD25OH 14.8 (L) 07/26/2020 04:50 PM    Clinical ASCVD: Yes  The 10-year ASCVD risk score (Arnett DK, et al., 2019) is: 20.9%   Values used to calculate the score:     Age: 65 years     Sex: Male     Is Non-Hispanic African American: No     Diabetic: No     Tobacco smoker: No     Systolic Blood Pressure: 759 mmHg     Is BP treated: No     HDL Cholesterol: 44 mg/dL     Total Cholesterol: 171 mg/dL    Depression screen [facility] 2/9 03/25/2021 03/25/2021 03/24/2020  Decreased Interest 0 0 1  Down, Depressed, Hopeless 0 0 1  PHQ - 2 Score 0 0 2  Altered sleeping - - 0  Tired, decreased energy - - 1  Change in appetite - - 0  Feeling bad or failure about yourself  - - 1  Trouble concentrating - - 0  Moving slowly or fidgety/restless - - 0  Suicidal thoughts - - 0  PHQ-9 Score - - 4  Difficult doing work/chores - - Not difficult at all  Some recent data might be hidden     Social History   Tobacco Use  Smoking Status Former  Smokeless Tobacco Never   BP Readings from Last 3 Encounters:  05/11/21 (!) 142/90  02/18/21 (!) 142/80  12/29/20 124/70   Pulse Readings from Last 3 Encounters:  05/11/21 82  02/18/21 81  12/29/20 69   Wt Readings from Last 3 Encounters:  05/11/21 237 lb (107.5 kg)  02/18/21 239 lb 11.2 oz (108.7 kg)  12/29/20 234 lb 11.2 oz (106.5 kg)   BMI Readings from Last 3 Encounters:  05/11/21 31.27 kg/m  02/18/21 31.62 kg/m  12/29/20 30.96 kg/m    Assessment/Interventions: Review of patient past medical history, allergies, medications, health status, including review of consultants reports, laboratory and other test data, was performed as part of comprehensive evaluation and provision of chronic care management services.   SDOH:  (Social Determinants of Health) assessments and  interventions performed: Yes   SDOH Screenings   Alcohol Screen: Low Risk    Last Alcohol Screening Score (AUDIT): 2  Depression (PHQ2-9): Low Risk    PHQ-2 Score: 0  Financial Resource Strain: Low Risk    Difficulty of Paying Living Expenses: Not hard at all  Food Insecurity: No Food Insecurity   Worried About Charity fundraiser in the Last Year: Never true   Ran Out of Food in the Last Year: Never true  Housing: Low Risk  Last Housing Risk Score: 0  Physical Activity: Insufficiently Active   Days of Exercise per Week: 3 days   Minutes of Exercise per Session: 40 min  Social Connections: Moderately Integrated   Frequency of Communication with Friends and Family: Three times a week   Frequency of Social Gatherings with Friends and Family: Three times a week   Attends Religious Services: Never   Active Member of Clubs or Organizations: Yes   Attends Music therapist: More than 4 times per year   Marital Status: Married  Stress: No Stress Concern Present   Feeling of Stress : Only a little  Tobacco Use: Medium Risk   Smoking Tobacco Use: Former   Smokeless Tobacco Use: Never   Passive Exposure: Not on file  Transportation Needs: No Transportation Needs   Lack of Transportation (Medical): No   Lack of Transportation (Non-Medical): No   CCM Care Plan  Allergies  Allergen Reactions   Shellfish Allergy Anaphylaxis   Penicillins     REACTION: childhood, reaction unknown    Medications Reviewed Today     Reviewed by Viona Gilmore, [facility] (Pharmacist) on 06/27/21 at 1310  Med List Status: <None>   Medication Order Taking? Sig Documenting Provider Last Dose Status Informant  allopurinol (ZYLOPRIM) 300 MG tablet [id number]  TAKE 1 TABLET BY MOUTH EVERY DAY Burchette, Alinda Sierras, MD  Active   amphetamine-dextroamphetamine (ADDERALL) 10 MG tablet [id number] Yes Take 1 tablet (10 mg total) by mouth 2 (two) times daily. Eulas Post, MD Taking Active    Cholecalciferol 50 MCG [phone] [place]) TBDP [id number]  Take 2 tablets by mouth daily. Fay Records, MD  Active   levothyroxine (SYNTHROID) 137 MCG tablet [id number] Yes TAKE 1 TABLET BY MOUTH EVERY MORNING BEFORE breakfast Burchette, Alinda Sierras, MD Taking Active   oxyCODONE (OXY IR/ROXICODONE) 5 MG immediate release tablet [id number] Yes oxycodone 5 mg tablet  Take 1 tablet twice a day by oral route as needed. [provider] Taking Active   sildenafil (VIAGRA) 100 MG tablet [id number]  Take 0.5-1 tablets (50-100 mg total) by mouth daily as needed for erectile dysfunction. Eulas Post, MD  Active   terbinafine (LAMISIL) 250 MG tablet [id number]  Take 1 tablet (250 mg total) by mouth daily. Eulas Post, MD  Active   warfarin (COUMADIN) 5 MG tablet [id number]  TAKE 3 TABLETS BY MOUTH DAILY EXCEPT TAKE 2 TABLETS ON WEDNESDAYS OR AS DIRECTED BY ANTICOAGULATION CLINIC Eulas Post, MD  Active             Patient Active Problem List   Diagnosis Date Noted   Cervical radiculitis 02/27/2019   Lumbar spondylosis 02/27/2019   Cervical spondylosis 12/04/2018   Hypertensive disorder 06/24/2018   Thyroiditis 06/24/2018   Conductive hearing loss of left ear with unrestricted hearing of right ear 06/20/2018   Low back pain 05/20/2018   Injury of left rotator cuff 05/15/2018   Neck pain 05/15/2018   Strain of neck muscle 05/15/2018   Gout 02/22/2018   Long term (current) use of anticoagulants 03/26/2017   Encounter for therapeutic drug monitoring 10/02/2013   Elevated blood pressure reading without diagnosis of hypertension 06/23/2013   Cerumen impaction 06/23/2013   Long term current use of anticoagulant 07/13/2010   HEMORRHOIDS-INTERNAL 12/20/2009   PERSONAL HX COLONIC POLYPS 12/20/2009   LATERAL EPICONDYLITIS 06/02/2009   CONJUNCTIVITIS, BACTERIAL 04/27/2009   INSOMNIA, TRANSIENT 04/27/2009   Hyperlipidemia 04/01/2009   SUDDEN VISUAL LOSS 12/25/2008  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   Patient reports he is really enjoying the Adderall but is worried about his pain management contract with [facility]. He is unsure if they will allow him to continue with both. He is only taking 10 mg once daily and is not taking or needing naps during the day. He is back at tasks quicker and is getting through things at work more efficiently.  Patient does like salt and has cut it down    Conditions to be addressed/monitored:  Hyperlipidemia, Hypothyroidism, Gout, and Aortic valve replacement, Vitamin D deficiency, ADD  Conditions addressed this visit: Aortic valve replacement/chronic anticoagulation, hypothyroidism, ADD  Care Plan : [place]  Updates made by Viona Gilmore, [place] since 06/27/2021 12:00 AM     Problem: Problem: Hyperlipidemia, Hypothyroidism, Gout, and Aortic valve replacement, Vitamin D deficiency      Long-Range Goal: Patient-Specific Goal   Start Date: 12/27/2020  Expected End Date: 12/27/2021  Recent Progress: On track  Priority: High  Note:   Current Barriers:  Unable to independently monitor therapeutic efficacy  Pharmacist Clinical Goal(s):  Patient will achieve adherence to monitoring guidelines and medication adherence to achieve therapeutic efficacy maintain control of hypothyroidism as evidenced by TSH  through collaboration with PharmD and provider.   Interventions: 1:1 collaboration with Eulas Post, MD regarding development and update of comprehensive plan of care as evidenced by provider attestation  and co-signature Inter-disciplinary care team collaboration (see longitudinal plan of care) Comprehensive medication review performed; medication list updated in electronic medical record  Hyperlipidemia: (LDL goal < 100) -Controlled  -Current treatment: No medications -Medications previously tried: none -Current dietary patterns: following a keto diet -Current exercise habits: plays golf but no formal exercise -Educated on Cholesterol goals;  Importance of limiting foods high in cholesterol; Exercise goal of 150 minutes per week; -Counseled on diet and exercise extensively Consider statin therapy based on ASCVD risk and elevated triglycerides  Hypothyroidsim (Goal: TSH 0.35-4.5) -Controlled  -Current treatment  Levothyroxine 137 mcg 1 tablet once daily - appropriate, effective, safe, accessible -Medications previously tried: none  -Recommended to continue current medication Educated on importance of being consistent with taking this medication and separating from warfarin.  Gout (Goal: prevent flare ups, uric acid < 6) -Controlled -Current treatment  Allopurinol 300 mg  1 tablet daily - appropriate, effective, safe, accessible -Medications previously tried: none  -Recommended to continue current medication Counseled on foods with high purine content or foods that don't allow for purines to be processes effectively Recommended repeat uric acid level  Vitamin D deficency (Goal: vitamin D 30-100) -Uncontrolled -Current treatment  Vitamin D 2000 units daily - appropriate, query effective -Medications previously tried: none  -Counseled on symptoms of low vitamin D and the recommendations for supplementation as he was unaware to start. Recommended taking with food. Schedule office visit with PCP to get lab work for repeat vitamin D level.  Aortic valve replacement (Goal: prevent blood clots) -Controlled -Current treatment  Warfarin 5 mg 1 tablet as directed by the coumadin  clinic - appropriate, effective, safe, accessible -Medications  previously tried: none  -Counseled on diet and exercise extensively Recommended to continue current medication Counseled on consistent intake of greens from week to week. Recommended moving to the evening time for ease of dose adjustments and separating from levothyroxine.  Pain (Goal: minimize pain) -Controlled -Current treatment  Oxycodone 5 mg 1 tablet twice daily as needed (taking 1/2 before golf and 1/2 tablet after) - appropriate, effective, safe, accessible -Medications previously tried: none  -Counseled on limiting use  Attention deficit (Goal: minimize daytime fatigue) -Controlled -Current treatment  Adderall 10 mg 1 tablet twice daily (taking once daily) - - appropriate, effective, safe, accessible -Medications previously tried: none  -Recommended to continue current medication   Health Maintenance -Vaccine gaps: shingrix, Prevnar -Current therapy:  none -Educated on Cost vs benefit of each product must be carefully weighed by individual consumer -Patient is satisfied with current therapy and denies issues -Recommended to continue as is.  Patient Goals/Self-Care Activities Patient will:  - take medications as prescribed target a minimum of 150 minutes of moderate intensity exercise weekly  Follow Up Plan: Telephone follow up appointment with care management team member scheduled for: 1 year       Medication Assistance: None required.  Patient affirms current coverage meets needs.  Compliance/Adherence/Medication fill history: Care Gaps: Hep C screening, shingrix, Prevnar, COVID booster (he is done with them)  Star-Rating Drugs: None  Patient's preferred pharmacy is:  [id number] [id number]: [phone]: [phone] [facility] [place] [id number]: [phone]: [phone]  Uses pill box? Yes Pt endorses 100% compliance  We discussed: Current pharmacy is preferred with insurance plan and patient is satisfied with pharmacy services Patient decided to: Continue current medication management strategy  Care Plan and Follow Up Patient Decision:  Patient agrees to Care Plan and Follow-up.  Plan: Telephone follow up appointment with care management team member scheduled for:  1 year  MJeni Salles PharmD, BCusterPharmacist [place] [facility] [phone]

## 2021-06-28 DIAGNOSIS — H7292 Unspecified perforation of tympanic membrane, left ear: Secondary | ICD-10-CM | POA: Diagnosis not present

## 2021-06-28 DIAGNOSIS — H90A12 Conductive hearing loss, unilateral, left ear with restricted hearing on the contralateral side: Secondary | ICD-10-CM | POA: Diagnosis not present

## 2021-06-28 DIAGNOSIS — H9113 Presbycusis, bilateral: Secondary | ICD-10-CM | POA: Diagnosis not present

## 2021-07-05 DIAGNOSIS — B351 Tinea unguium: Secondary | ICD-10-CM | POA: Diagnosis not present

## 2021-07-05 DIAGNOSIS — M79676 Pain in unspecified toe(s): Secondary | ICD-10-CM | POA: Diagnosis not present

## 2021-07-12 DIAGNOSIS — E039 Hypothyroidism, unspecified: Secondary | ICD-10-CM | POA: Diagnosis not present

## 2021-07-12 DIAGNOSIS — E785 Hyperlipidemia, unspecified: Secondary | ICD-10-CM | POA: Diagnosis not present

## 2021-07-15 ENCOUNTER — Other Ambulatory Visit: Payer: Self-pay | Admitting: Family Medicine

## 2021-07-15 ENCOUNTER — Telehealth: Payer: Self-pay | Admitting: Family Medicine

## 2021-07-15 NOTE — Telephone Encounter (Signed)
Patient is requesting a refill for levothyroxine (SYNTHROID) 137 MCG tablet [[id number]]  to be sent to his pharmacy.  Patient could be contacted at [phone].  Please advise.

## 2021-07-15 NOTE — Telephone Encounter (Signed)
Rx has already been sent in today. Left a detailed message on verified voice mail informing the patient that his refill has been sent in.

## 2021-07-25 ENCOUNTER — Ambulatory Visit (INDEPENDENT_AMBULATORY_CARE_PROVIDER_SITE_OTHER): Payer: Medicare Other

## 2021-07-25 DIAGNOSIS — Z7901 Long term (current) use of anticoagulants: Secondary | ICD-10-CM | POA: Diagnosis not present

## 2021-07-25 LAB — POCT INR: INR: 2.9 (ref 2.0–3.0)

## 2021-07-25 NOTE — Patient Instructions (Addendum)
Pre visit review using our clinic review tool, if applicable. No additional management support is needed unless otherwise documented below in the visit note.  Continue 2 tablets daily except take 3 tablets on Wednesdays.  Recheck in 6 wks.

## 2021-07-25 NOTE — Progress Notes (Signed)
Continue 2 tablets daily except take 3 tablets on Wednesdays.  Recheck in 6 wks.

## 2021-07-28 ENCOUNTER — Encounter: Payer: Self-pay | Admitting: Family Medicine

## 2021-07-29 MED ORDER — AMPHETAMINE-DEXTROAMPHETAMINE 10 MG PO TABS: 10.0000 mg | ORAL_TABLET | Freq: Two times a day (BID) | ORAL | 0 refills | Status: DC

## 2021-08-07 ENCOUNTER — Other Ambulatory Visit: Payer: Self-pay | Admitting: Family Medicine

## 2021-08-07 DIAGNOSIS — M109 Gout, unspecified: Secondary | ICD-10-CM

## 2021-08-28 ENCOUNTER — Other Ambulatory Visit: Payer: Self-pay | Admitting: Family Medicine

## 2021-08-28 DIAGNOSIS — Z7901 Long term (current) use of anticoagulants: Secondary | ICD-10-CM

## 2021-08-29 NOTE — Telephone Encounter (Signed)
Pt is compliant with warfarin management and PCP apts. ?Sent in refill.  ?

## 2021-08-31 DIAGNOSIS — M5116 Intervertebral disc disorders with radiculopathy, lumbar region: Secondary | ICD-10-CM | POA: Diagnosis not present

## 2021-08-31 DIAGNOSIS — M4722 Other spondylosis with radiculopathy, cervical region: Secondary | ICD-10-CM | POA: Diagnosis not present

## 2021-08-31 DIAGNOSIS — M5010 Cervical disc disorder with radiculopathy, unspecified cervical region: Secondary | ICD-10-CM | POA: Diagnosis not present

## 2021-09-05 ENCOUNTER — Ambulatory Visit (INDEPENDENT_AMBULATORY_CARE_PROVIDER_SITE_OTHER): Payer: Medicare Other

## 2021-09-05 DIAGNOSIS — Z7901 Long term (current) use of anticoagulants: Secondary | ICD-10-CM | POA: Diagnosis not present

## 2021-09-05 LAB — POCT INR: INR: 1.9 — AB (ref 2.0–3.0)

## 2021-09-05 NOTE — Progress Notes (Addendum)
Pt reported he has not been taking the dose on the calendar. He has been taking 15 mg twice weekly, not once weekly as advised. Updated calendar. ?Increase dose today to take 3 tablets and then continue 2 tablets daily except take 3 tablets on Wednesdays.  Recheck in 4 wks.  ?

## 2021-09-05 NOTE — Patient Instructions (Addendum)
Pre visit review using our clinic review tool, if applicable. No additional management support is needed unless otherwise documented below in the visit note. ? ?Increase dose today to take 3 tablets and then continue 2 tablets daily except take 3 tablets on Wednesdays.  Recheck in 4 wks.  ?

## 2021-09-13 DIAGNOSIS — M79676 Pain in unspecified toe(s): Secondary | ICD-10-CM | POA: Diagnosis not present

## 2021-09-13 DIAGNOSIS — B351 Tinea unguium: Secondary | ICD-10-CM | POA: Diagnosis not present

## 2021-09-23 ENCOUNTER — Encounter: Payer: Self-pay | Admitting: Family Medicine

## 2021-09-25 MED ORDER — AMPHETAMINE-DEXTROAMPHETAMINE 10 MG PO TABS: 10.0000 mg | ORAL_TABLET | Freq: Two times a day (BID) | ORAL | 0 refills | Status: DC

## 2021-10-03 ENCOUNTER — Ambulatory Visit: Payer: No Typology Code available for payment source

## 2021-10-05 IMAGING — MR MR CERVICAL SPINE W/O CM
4 of 5 series · 28 of 48 positions shown · non-contrast
Comparison: Cervical spine radiographs 07/17/2012.

CLINICAL DATA: 69-year-old male with neck pain radiating to the
left arm, numbness in the hand. Spinal injection 2 months ago
without improvement.

EXAM:
MRI CERVICAL SPINE WITHOUT CONTRAST
TECHNIQUE: Multiplanar, multisequence MR imaging of the cervical spine was
performed. No intravenous contrast was administered.

[Series 3: T2 · sagittal · 3.0mm · 0.41mm/px · 6 of 15 slices shown (1 of 2)]
[im 1/15]
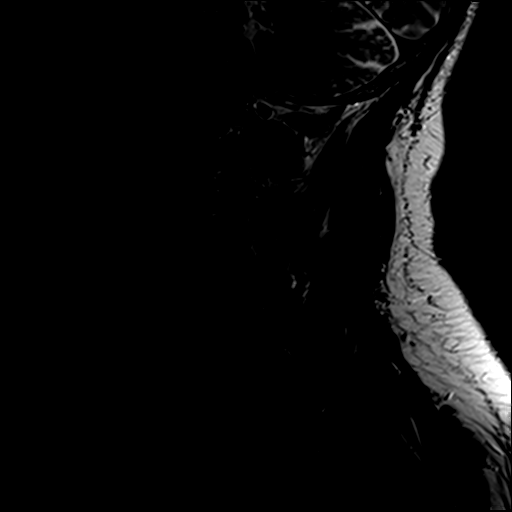
[im 3/15]
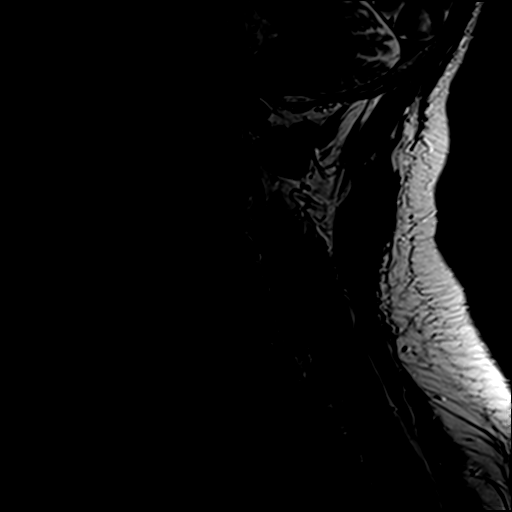
[im 6/15]
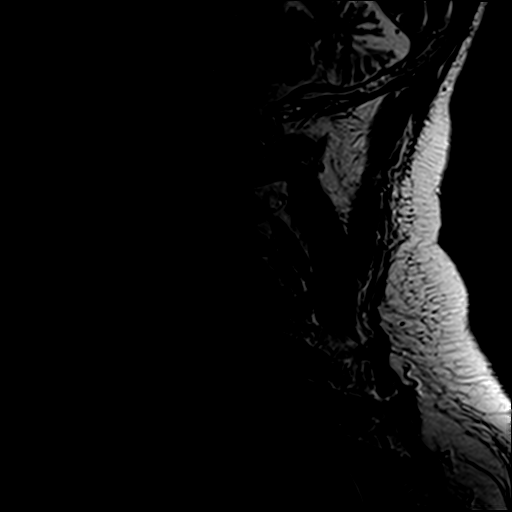
[im 9/15]
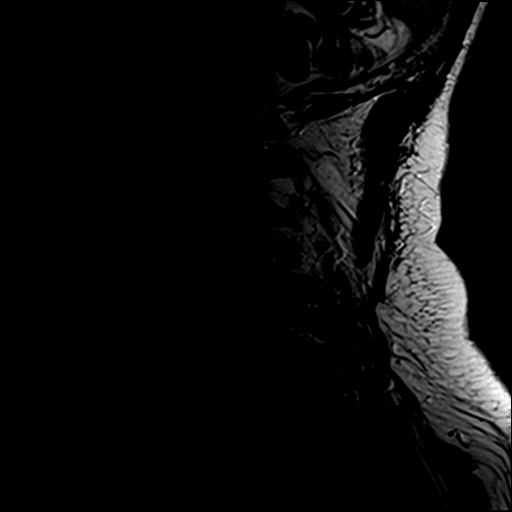
[im 12/15]
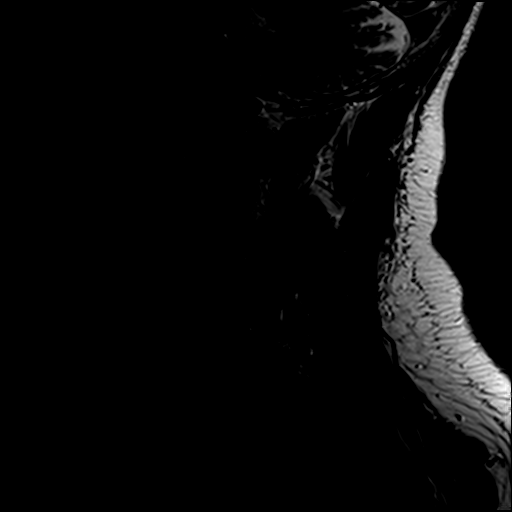
[im 15/15]
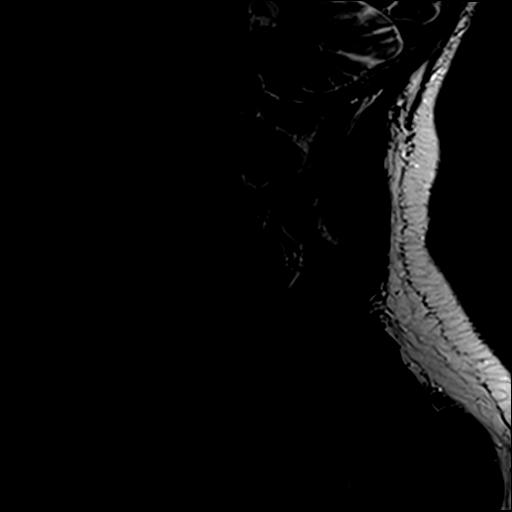

[Series 4: T1 · sagittal · 3.0mm · 0.41mm/px · 7 of 15 slices shown]
[im 1/15]
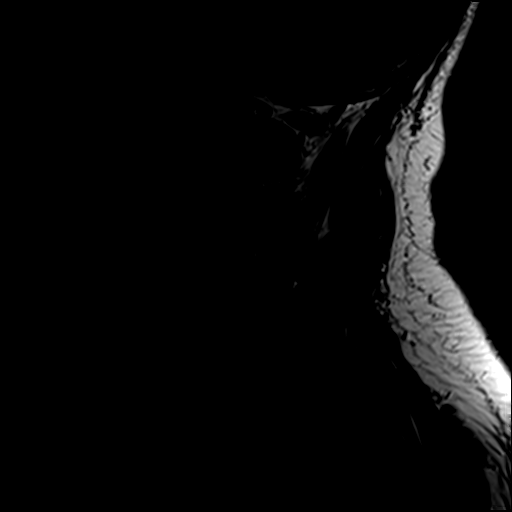
[im 3/15]
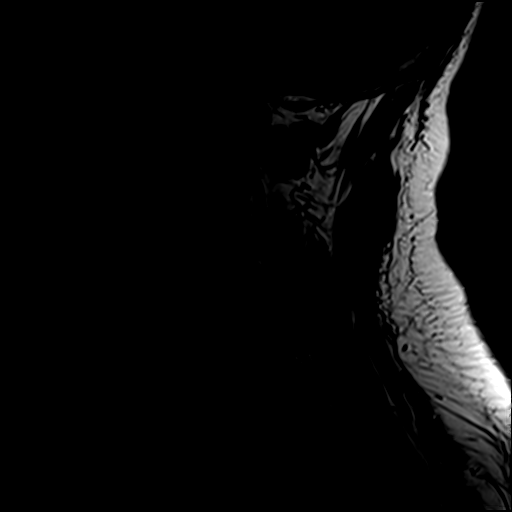
[im 5/15]
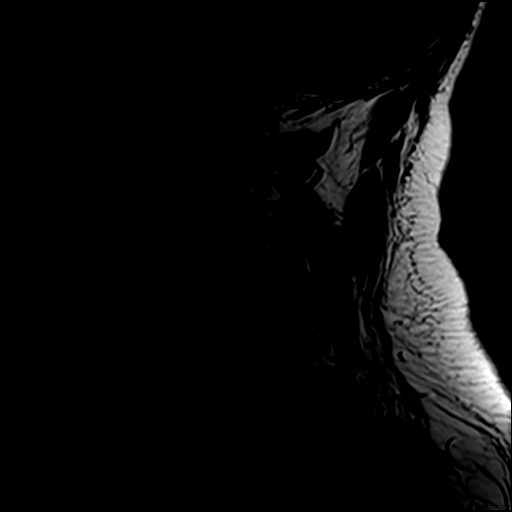
[im 8/15]
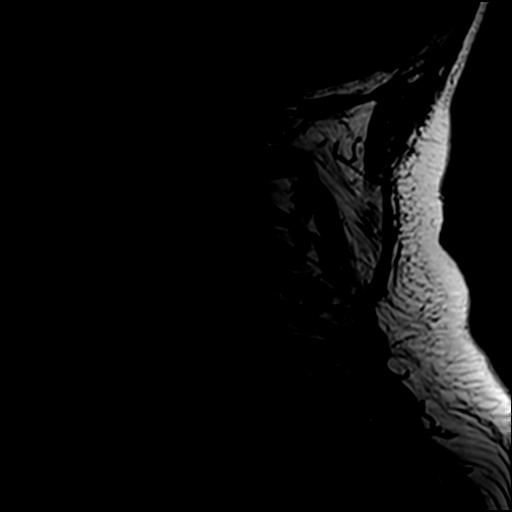
[im 10/15]
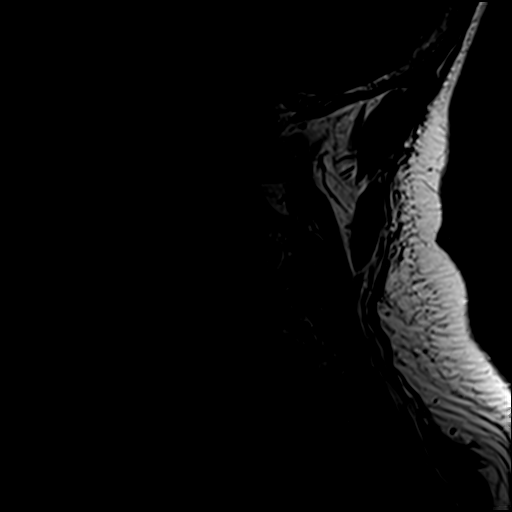
[im 12/15]
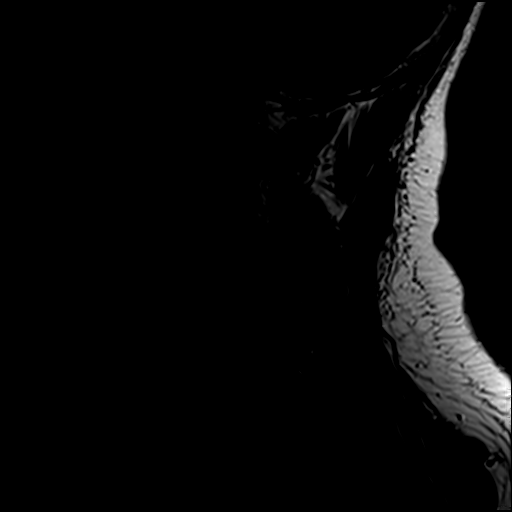
[im 15/15]
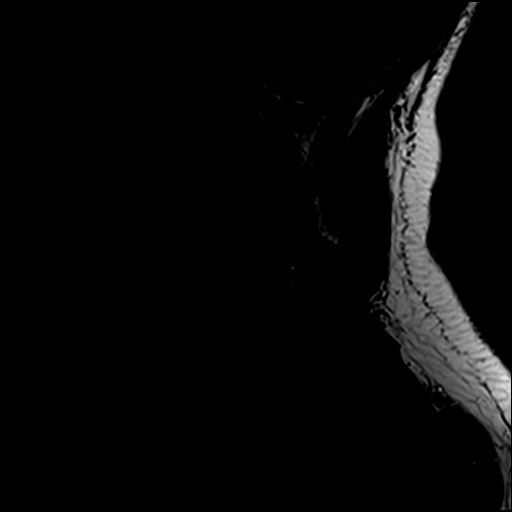

[Series 5: STIR · sagittal · 3.0mm · 0.82mm/px · 7 of 15 slices shown]
[im 1/15]
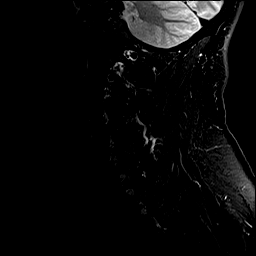
[im 3/15]
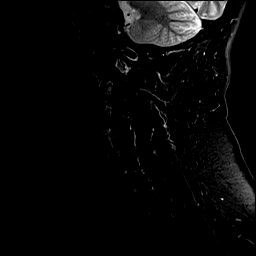
[im 5/15]
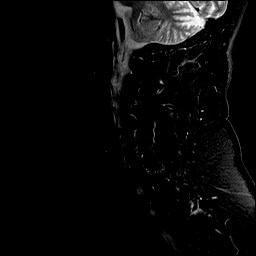
[im 8/15]
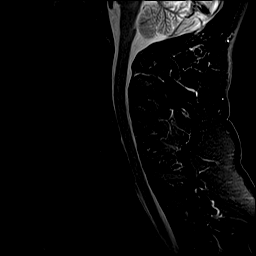
[im 10/15]
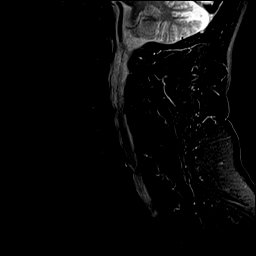
[im 12/15]
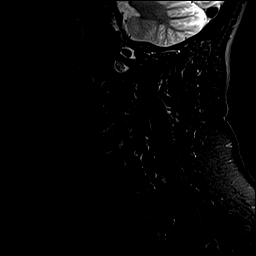
[im 15/15]
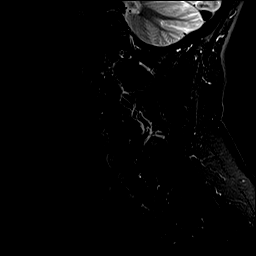

[Series 7: T2 · axial · 3.0mm · 0.70mm/px · z∈[-103,-2]mm · 8 of 29 slices shown (2 of 2)]
[im 1/29]
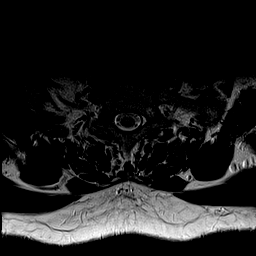
[im 5/29]
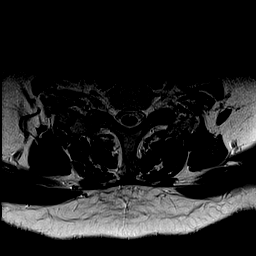
[im 9/29]
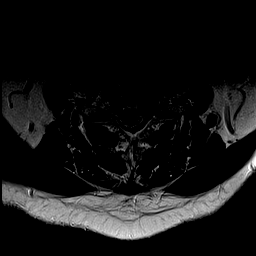
[im 13/29]
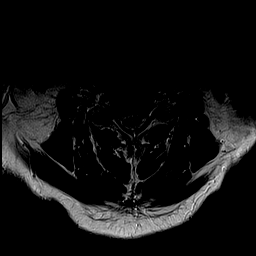
[im 16/29]
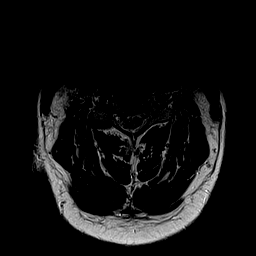
[im 20/29]
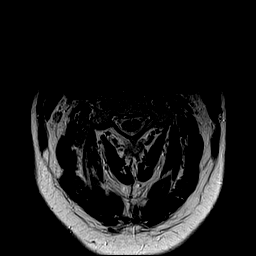
[im 24/29]
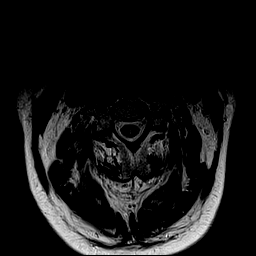
[im 29/29]
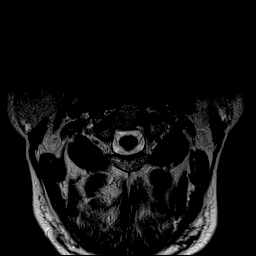

[28 of 48 positions shown; findings below may reference images not displayed]

FINDINGS: Alignment: Chronic straightening but mildly improved cervical
lordosis compared to the 7100 radiographs. There is subtle
degenerative appearing retrolisthesis of C6 on C7 now.

Vertebrae: No marrow edema or evidence of acute osseous abnormality.
Visualized bone marrow signal is within normal limits.

Cord: Spinal cord signal is within normal limits at all visualized
levels.

Posterior Fossa, vertebral arteries, paraspinal tissues:
Cervicomedullary junction is within normal limits. Negative visible
posterior fossa. Preserved major vascular flow voids in the neck.
Negative visible neck soft tissues and lung apices.

Disc levels:

C2-C3:  Negative.

C3-C4: Mild foraminal disc bulging and endplate spurring plus facet
and ligament flavum hypertrophy greater on the left. No significant
spinal stenosis. Moderate to severe left C4 foraminal stenosis.

C4-C5: Rightward disc bulging and endplate spurring with
superimposed right lateral recess disc protrusion (series 7, image
13). Mild facet hypertrophy greater on the right. Mild spinal
stenosis and right hemi cord mass effect. Severe right C5 foraminal
stenosis.

C5-C6: Right eccentric circumferential disc bulge and endplate
spurring with broad-based posterior component. Mild spinal stenosis
with mild if any cord mass effect (series 7, image 18). Moderate
left and severe right C6 foraminal stenosis.

C6-C7: Circumferential disc bulge and endplate spurring with
broad-based posterior component. Left greater than right foraminal
involvement. Mild spinal stenosis. No cord mass effect. Severe left
and moderate right C7 foraminal stenosis.

C7-T1:  Borderline to mild facet hypertrophy. No stenosis.

Disc bulging at T2-T[DATE] result in mild spinal stenosis at that
level.
IMPRESSION: 1. Widespread cervical disc and endplate degeneration.

2. Symptomatic level with regard to left upper extremity symptoms
[DATE]-C7, where left foraminal involvement by disc results in up
to severe stenosis. Query left C7 radiculitis.

3. Otherwise mild spinal stenosis from C4-C5 to C6-C7, with up to
mild cord mass effect but no spinal cord signal abnormality. And
moderate or severe neural foraminal stenosis at the left C4, right
C5, right greater than left C6 nerve levels.

## 2021-10-12 ENCOUNTER — Other Ambulatory Visit: Payer: Self-pay | Admitting: Family Medicine

## 2021-10-12 ENCOUNTER — Ambulatory Visit (INDEPENDENT_AMBULATORY_CARE_PROVIDER_SITE_OTHER): Payer: Medicare Other

## 2021-10-12 DIAGNOSIS — Z7901 Long term (current) use of anticoagulants: Secondary | ICD-10-CM | POA: Diagnosis not present

## 2021-10-12 LAB — POCT INR: INR: 2.2 (ref 2.0–3.0)

## 2021-10-12 NOTE — Patient Instructions (Addendum)
Pre visit review using our clinic review tool, if applicable. No additional management support is needed unless otherwise documented below in the visit note. ? ?Continue 2 tablets daily except take 3 tablets on Wednesdays and Saturdays  Recheck in 6 wks.  ?

## 2021-10-12 NOTE — Progress Notes (Signed)
Continue 2 tablets daily except take 3 tablets on Wednesdays and Saturdays  Recheck in 6 wks.  

## 2021-10-19 ENCOUNTER — Telehealth: Payer: Self-pay | Admitting: Pharmacist

## 2021-10-19 NOTE — Chronic Care Management (AMB) (Signed)
    Chronic Care Management Pharmacy Assistant   Name: Samuel [name]  MRN: [id number] DOB: September 04, 1950   Reason for Encounter: Disease State General Assessment   Recent office visits:  10/12/21 Sherrie George, RN - Patient presented for Anti Coag Visit reading was 2.2  09/05/21 Sherrie George, RN - Patient presented for Anti Coag Visit reading was 1.9  07/25/21 Sherrie George, RN - Patient presented for Anti Coag Visit reading was 2.9  06/27/21 Sherrie George, RN - Patient presented for Anti Coag Visit reading was 2.1  Recent consult visits:  08/31/21 Shayne Alken - Patient presented to [facility] for Cervical radiculitis and other concerns. No medication changes.  07/05/21 Adam Phenix (Podiatry) - Patient presented for Pain in unspecified toes and other concerns. No other visit details available.  06/28/21 Susy Frizzle, MD (ENT) - Patient presented for Conductive hearing loss of left ear with restricted hearing of right eat and other concerns. No medication changes.  Hospital visits:  None in previous 6 months  Medications: Outpatient Encounter Medications as of 10/19/2021  Medication Sig   allopurinol (ZYLOPRIM) 300 MG tablet TAKE 1 TABLET BY MOUTH EVERY DAY   amphetamine-dextroamphetamine (ADDERALL) 10 MG tablet Take 1 tablet (10 mg total) by mouth 2 (two) times daily.   Cholecalciferol 50 MCG (2000 UT) TBDP Take 2 tablets by mouth daily.   levothyroxine (SYNTHROID) 137 MCG tablet TAKE 1 TABLET BY MOUTH EVERY MORNING BEFORE BREAKFAST   oxyCODONE (OXY IR/ROXICODONE) 5 MG immediate release tablet oxycodone 5 mg tablet  Take 1 tablet twice a day by oral route as needed.   sildenafil (VIAGRA) 100 MG tablet Take 0.5-1 tablets (50-100 mg total) by mouth daily as needed for erectile dysfunction.   terbinafine (LAMISIL) 250 MG tablet Take 1 tablet (250 mg total) by mouth daily.   warfarin (COUMADIN) 5 MG tablet TAKE 2 TABS DAILY EXCEPT TAKE 3 TABLETS ON WEDNESDAYS OR AS DIRECTED  BY ANTICOAGULATION CLINIC   No facility-administered encounter medications on file as of 10/19/2021.   Contacted [name] for General Review Call   Chart Review:  Have there been any documented new, changed, or discontinued medications since last visit? No  Has there been any documented recent hospitalizations or ED visits since last visit with Clinical Pharmacist? No   Disease State Questions:  Able to connect with Patient? No  Care Gaps: Hepatitis C Screening - Overdue Zoster Vaccine - Overdue PNA Vaccine - Overdue COVID Booster - Overdue CCM- 1/24 AWV- 10/22  Star Rating Drugs: None    Pamala Duffel CMA Clinical Pharmacist Assistant [phone]

## 2021-10-28 MED ORDER — AMPHETAMINE-DEXTROAMPHETAMINE 10 MG PO TABS: 10.0000 mg | ORAL_TABLET | Freq: Two times a day (BID) | ORAL | 0 refills | Status: DC

## 2021-10-28 NOTE — Addendum Note (Signed)
Addended by: Eulas Post on: 10/28/2021 05:22 AM   Modules accepted: Orders

## 2021-10-30 ENCOUNTER — Other Ambulatory Visit: Payer: Self-pay | Admitting: Family Medicine

## 2021-10-31 ENCOUNTER — Other Ambulatory Visit: Payer: Self-pay | Admitting: Family Medicine

## 2021-10-31 DIAGNOSIS — M109 Gout, unspecified: Secondary | ICD-10-CM

## 2021-11-22 DIAGNOSIS — B351 Tinea unguium: Secondary | ICD-10-CM | POA: Diagnosis not present

## 2021-11-22 DIAGNOSIS — M79676 Pain in unspecified toe(s): Secondary | ICD-10-CM | POA: Diagnosis not present

## 2021-11-23 ENCOUNTER — Ambulatory Visit (INDEPENDENT_AMBULATORY_CARE_PROVIDER_SITE_OTHER): Payer: Medicare Other

## 2021-11-23 DIAGNOSIS — Z7901 Long term (current) use of anticoagulants: Secondary | ICD-10-CM | POA: Diagnosis not present

## 2021-11-23 LAB — POCT INR: INR: 2.4 (ref 2.0–3.0)

## 2021-11-23 NOTE — Progress Notes (Signed)
Continue 2 tablets daily except take 3 tablets on Wednesdays and Saturdays  Recheck in 6 wks.

## 2021-11-23 NOTE — Patient Instructions (Addendum)
Pre visit review using our clinic review tool, if applicable. No additional management support is needed unless otherwise documented below in the visit note.  Continue 2 tablets daily except take 3 tablets on Wednesdays and Saturdays  Recheck in 6 wks.

## 2021-11-25 ENCOUNTER — Encounter: Payer: Self-pay | Admitting: Family Medicine

## 2021-11-25 ENCOUNTER — Ambulatory Visit (INDEPENDENT_AMBULATORY_CARE_PROVIDER_SITE_OTHER): Payer: Medicare Other | Admitting: Family Medicine

## 2021-11-25 VITALS — BP 142/80 | HR 75 | Temp 98.3°F | Ht 73.0 in | Wt 233.6 lb

## 2021-11-25 DIAGNOSIS — M25511 Pain in right shoulder: Secondary | ICD-10-CM | POA: Diagnosis not present

## 2021-11-25 DIAGNOSIS — H1131 Conjunctival hemorrhage, right eye: Secondary | ICD-10-CM

## 2021-11-25 DIAGNOSIS — F988 Other specified behavioral and emotional disorders with onset usually occurring in childhood and adolescence: Secondary | ICD-10-CM | POA: Diagnosis not present

## 2021-11-25 NOTE — Progress Notes (Signed)
Established Patient Office Visit  Subjective   Patient ID: Samuel [name], male    DOB: July 19, 1950  Age: 71 y.o. MRN: [id number]  Chief Complaint  Patient presents with   Shoulder Pain    Patient complains of right shoulder pain,    Eye Injury    Patient complains of eye trauma, x1 day    HPI   Samuel [name] is seen for the following issues  Right eye redness which started yesterday.  He had some itching and rubbed the eye and afterwards noticed some subconjunctival hemorrhage.  No blurred vision.  No eye pain.  He is on Coumadin.  Last INR which was just couple days ago was 2.4.  Right shoulder pain.  Noted for several months.  Worse with internal rotation and slightly with abduction.  Denies any specific injury.  Still able to play golf.  No major pain with golf swing.  Starting to have more night pain.  No weakness.  ADD.  Recently went on Adderall 10 mg that he takes once or twice daily.  This has helped his focus.  Denies any chest pains.  No headaches.  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   Dyslipidemia    Gout    Hemorrhoids    HEMORRHOIDS-INTERNAL 12/20/2009   History of diverticulitis 03/2018   HYPERLIPIDEMIA-MIXED 04/01/2009   HYPOTHYROIDISM 08/26/2008   INSOMNIA, TRANSIENT 04/27/2009   Kidney stones    KNEE PAIN 11/02/2008   LATERAL EPICONDYLITIS 06/02/2009   Migraines    ONYCHOMYCOSIS 11/02/2008   PERSONAL HX COLONIC POLYPS 12/20/2009   Sudden visual loss 12/25/2008   TRANSIENT ISCHEMIC ATTACK 01/25/2009   Past Surgical History:  Procedure Laterality Date   CYSTECTOMY     from abdominal wall   MYRINGOTOMY WITH TUBE PLACEMENT Left    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 He has never used smokeless tobacco. He reports current alcohol use of about 14.0 standard drinks of alcohol per week. He reports that he does not use drugs. family history  includes Diabetes in his mother; Heart disease in his father. Allergies  Allergen Reactions   Shellfish Allergy Anaphylaxis   Penicillins     REACTION: childhood, reaction unknown    Review of Systems  Constitutional:  Negative for chills and fever.  Eyes:  Positive for redness. Negative for blurred vision, pain and discharge.  Respiratory:  Negative for cough and shortness of breath.   Cardiovascular:  Negative for chest pain.  Neurological:  Negative for dizziness.      Objective:     BP (!) 142/80 (BP Location: Left Arm, Cuff Size: Normal)   Pulse 75   Temp 98.3 F (36.8 C) (Oral)   Ht 6\' 1"  (1.854 m)   Wt 233 lb 9.6 oz (106 kg)   SpO2 98%   BMI 30.82 kg/m  BP Readings from Last 3 Encounters:  11/25/21 (!) 142/80  05/11/21 (!) 142/90  02/18/21 (!) 142/80   Wt Readings from Last 3 Encounters:  11/25/21 233 lb 9.6 oz (106 kg)  05/11/21 237 lb (107.5 kg)  02/18/21 239 lb 11.2 oz (108.7 kg)      Physical Exam Vitals reviewed.  Constitutional:      Appearance: Normal appearance.  Eyes:     Comments: Right subconjunctival hemorrhage.  Extraocular movements normal.  Fundi benign.  No hyphema.  Cardiovascular:     Rate and Rhythm: Normal rate.  Pulmonary:     Effort: Pulmonary effort is normal.     Breath sounds: Normal breath sounds.  Musculoskeletal:     Comments: Right shoulder no localized tenderness.  He has fairly good range of motion.  Does have some pain with internal rotation and slight pain with abduction greater than 90 degrees against resistance.  No biceps tenderness.  No acromioclavicular tenderness.  No weakness.  Neurological:     Mental Status: He is alert.      No results found for any visits on 11/25/21.  Last CBC Lab Results  Component Value Date   WBC 6.7 06/21/2020   HGB 16.1 06/21/2020   HCT 47.1 06/21/2020   MCV 91.1 06/21/2020   RDW 13.6 06/21/2020   PLT 189.0 06/21/2020   Last metabolic panel Lab Results  Component Value  Date   GLUCOSE 91 06/21/2020   NA 136 06/21/2020   K 4.0 06/21/2020   CL 100 06/21/2020   CO2 30 06/21/2020   BUN 15 06/21/2020   CREATININE 0.84 06/21/2020   CALCIUM 9.4 06/21/2020   PROT 7.0 10/01/2020   ALBUMIN 4.7 10/01/2020   BILITOT 1.1 10/01/2020   ALKPHOS 62 10/01/2020   AST 34 10/01/2020   ALT 40 10/01/2020      The 10-year ASCVD risk score (Arnett DK, et al., 2019) is: 22.3%    Assessment & Plan:   #1 right subconjunctival hemorrhage.  Patient on Coumadin which increases risk.  Reassurance.  This should resolve without any intervention  #2 right shoulder pain.  Suspect probably rotator cuff tendinitis.  Maintain range of motion with stretches.  Offered steroid injection but he declines at this time.  #3 attention deficit disorder stable on Adderall 10 mg twice daily.  No follow-ups on file.    [name], MD

## 2021-11-30 ENCOUNTER — Other Ambulatory Visit: Payer: Self-pay | Admitting: Family Medicine

## 2021-11-30 DIAGNOSIS — Z7901 Long term (current) use of anticoagulants: Secondary | ICD-10-CM

## 2021-11-30 NOTE — Telephone Encounter (Signed)
Pt is compliant with warfarin management and PCP apts. ?Sent in refill.  ?

## 2021-12-08 ENCOUNTER — Telehealth: Payer: Self-pay | Admitting: Family Medicine

## 2021-12-08 NOTE — Telephone Encounter (Signed)
Pt call and stated he need a refill on amphetamine-dextroamphetamine (ADDERALL) 10 MG tablet sent to  [id number] - [place], [place] - [address] Phone:  [phone]  Fax:  [phone]    Pt stated he need this by tomorrow so he can use his flexible spending card he stated it run out tomorrow night.

## 2021-12-09 ENCOUNTER — Other Ambulatory Visit: Payer: Self-pay | Admitting: Family Medicine

## 2021-12-09 DIAGNOSIS — Z7901 Long term (current) use of anticoagulants: Secondary | ICD-10-CM

## 2021-12-09 DIAGNOSIS — M109 Gout, unspecified: Secondary | ICD-10-CM

## 2021-12-10 MED ORDER — AMPHETAMINE-DEXTROAMPHETAMINE 10 MG PO TABS: 10.0000 mg | ORAL_TABLET | Freq: Two times a day (BID) | ORAL | 0 refills | Status: DC

## 2022-01-03 ENCOUNTER — Telehealth: Payer: Self-pay | Admitting: Pharmacist

## 2022-01-03 DIAGNOSIS — M5136 Other intervertebral disc degeneration, lumbar region: Secondary | ICD-10-CM | POA: Diagnosis not present

## 2022-01-03 DIAGNOSIS — M5030 Other cervical disc degeneration, unspecified cervical region: Secondary | ICD-10-CM | POA: Diagnosis not present

## 2022-01-03 DIAGNOSIS — Z79899 Other long term (current) drug therapy: Secondary | ICD-10-CM | POA: Diagnosis not present

## 2022-01-03 DIAGNOSIS — G894 Chronic pain syndrome: Secondary | ICD-10-CM | POA: Diagnosis not present

## 2022-01-03 DIAGNOSIS — M5416 Radiculopathy, lumbar region: Secondary | ICD-10-CM | POA: Diagnosis not present

## 2022-01-03 DIAGNOSIS — Z5181 Encounter for therapeutic drug level monitoring: Secondary | ICD-10-CM | POA: Diagnosis not present

## 2022-01-03 NOTE — Chronic Care Management (AMB) (Signed)
    Chronic Care Management Pharmacy Assistant   Name: Samuel [name]  MRN: [id number] DOB: Feb 02, 1951  Reason for Encounter: Disease State   Conditions to be addressed/monitored: General  Assessment   Recent office visits:  11/25/21  Samuel [name] - Patient presented for Non-Traumatic subconjunctival hemorrhage of right eye and other concerns. No medication changes.  11/23/21 Patient presented for Anti Coag visit. Reading was 2.4  Recent consult visits:  None  Hospital visits:  None in previous 6 months  Medications: Outpatient Encounter Medications as of 01/03/2022  Medication Sig   allopurinol (ZYLOPRIM) 300 MG tablet TAKE 1 TABLET BY MOUTH EVERY DAY   amphetamine-dextroamphetamine (ADDERALL) 10 MG tablet Take 1 tablet (10 mg total) by mouth 2 (two) times daily.   Cholecalciferol 50 MCG (2000 UT) TBDP Take 2 tablets by mouth daily.   levothyroxine (SYNTHROID) 137 MCG tablet TAKE 1 TABLET BY MOUTH EVERY MORNING BEFORE BREAKFAST   oxyCODONE (OXY IR/ROXICODONE) 5 MG immediate release tablet oxycodone 5 mg tablet  Take 1 tablet twice a day by oral route as needed.   sildenafil (VIAGRA) 100 MG tablet Take 0.5-1 tablets (50-100 mg total) by mouth daily as needed for erectile dysfunction.   terbinafine (LAMISIL) 250 MG tablet Take 1 tablet (250 mg total) by mouth daily.   warfarin (COUMADIN) 5 MG tablet TAKE 2 TABLETS DAILY EXCEPT TAKE 3 TABLETS ON WEDNESDAYS AND SATURDAYS OR AS DIRECTED BY ANTICOAGULATION CLINIC   No facility-administered encounter medications on file as of 01/03/2022.   Contacted Samuel [name] for General Review Call  Adherence Review:  Does the Clinical Pharmacist Assistant have access to adherence rates? Yes No Star Rated Medications for this PT  Disease State Questions:  Able to connect with Patient? Yes Did patient have any problems with their health recently? No  Have you had any admissions or emergency room visits or worsening of your condition(s)  since last visit? No  Have you had any visits with new specialists or providers since your last visit? No  Have you had any new health care problem(s) since your last visit? No  Have you run out of any of your medications since you last spoke with clinical pharmacist? No  Are there any medications you are not taking as prescribed? No  Are you having any issues or side effects with your medications? No  Do you have any other health concerns or questions you want to discuss with your Clinical Pharmacist before your next visit? No  Are there any health concerns that you feel we can do a better job addressing? No  Are you having any problems with any of the following since the last visit: (select all that apply)  None  12. Any falls since last visit? No  13. Any increased or uncontrolled pain since last visit? No   Notes: Patient reports he is doing well has an upcoming appt with the coumadin clinic next week is good on fills of his medications and has no concerns or questions at this time.  Care Gaps: Hepatitis C Screening - Overdue  Zoster Vaccine - Overdue PNA Vaccine - Overdue COVID Booster - Overdue CCM- 1/24 AWV- 10/22  Star Rating Drugs: None   Pamala Duffel CMA Clinical Pharmacist Assistant [phone]

## 2022-01-04 ENCOUNTER — Ambulatory Visit: Payer: No Typology Code available for payment source

## 2022-01-10 ENCOUNTER — Other Ambulatory Visit: Payer: Self-pay | Admitting: Family Medicine

## 2022-01-11 ENCOUNTER — Encounter: Payer: Self-pay | Admitting: Family Medicine

## 2022-01-11 ENCOUNTER — Ambulatory Visit (INDEPENDENT_AMBULATORY_CARE_PROVIDER_SITE_OTHER): Payer: Medicare Other

## 2022-01-11 ENCOUNTER — Telehealth: Payer: Self-pay | Admitting: Family Medicine

## 2022-01-11 DIAGNOSIS — Z7901 Long term (current) use of anticoagulants: Secondary | ICD-10-CM | POA: Diagnosis not present

## 2022-01-11 LAB — POCT INR: INR: 4.1 — AB (ref 2.0–3.0)

## 2022-01-11 MED ORDER — AMPHETAMINE-DEXTROAMPHETAMINE 10 MG PO TABS: 10.0000 mg | ORAL_TABLET | Freq: Two times a day (BID) | ORAL | 0 refills | Status: DC

## 2022-01-11 MED ORDER — LEVOTHYROXINE SODIUM 137 MCG PO TABS: ORAL_TABLET | ORAL | 2 refills | Status: DC

## 2022-01-11 NOTE — Progress Notes (Cosign Needed Addendum)
Pt has been on vacation for a week and consumed more alcohol than normal and also did not eat as many greens as he normally does. Due to this change in alcohol and diet and pt being in range with this dosing for the last two encounters, no long term change will be made to his weekly dosing.   Hold dose today and only take 1 tablet tomorrow and then continue 2 tablets daily except take 3 tablets on Wednesdays and Saturdays  Recheck in 3 wks.

## 2022-01-11 NOTE — Patient Instructions (Addendum)
Pre visit review using our clinic review tool, if applicable. No additional management support is needed unless otherwise documented below in the visit note.  Hold dose today and only take 1 tablet tomorrow and then continue 2 tablets daily except take 3 tablets on Wednesdays and Saturdays  Recheck in 3 wks.

## 2022-01-11 NOTE — Telephone Encounter (Signed)
Rx sent 

## 2022-01-11 NOTE — Telephone Encounter (Signed)
Requests refill levothyroxine (SYNTHROID) 137 MCG tablet  [id number] - [place], [place] - [address] Phone:  [phone]  Fax:  [phone]

## 2022-01-25 ENCOUNTER — Ambulatory Visit (INDEPENDENT_AMBULATORY_CARE_PROVIDER_SITE_OTHER): Payer: Medicare Other | Admitting: Family Medicine

## 2022-01-25 ENCOUNTER — Encounter: Payer: Self-pay | Admitting: Family Medicine

## 2022-01-25 VITALS — BP 146/80 | HR 75 | Temp 98.4°F | Ht 73.0 in | Wt 232.1 lb

## 2022-01-25 DIAGNOSIS — E039 Hypothyroidism, unspecified: Secondary | ICD-10-CM | POA: Diagnosis not present

## 2022-01-25 DIAGNOSIS — F988 Other specified behavioral and emotional disorders with onset usually occurring in childhood and adolescence: Secondary | ICD-10-CM | POA: Diagnosis not present

## 2022-01-25 DIAGNOSIS — Z79899 Other long term (current) drug therapy: Secondary | ICD-10-CM

## 2022-01-25 DIAGNOSIS — Z7901 Long term (current) use of anticoagulants: Secondary | ICD-10-CM

## 2022-01-25 DIAGNOSIS — M109 Gout, unspecified: Secondary | ICD-10-CM

## 2022-01-25 DIAGNOSIS — Q231 Congenital insufficiency of aortic valve: Secondary | ICD-10-CM

## 2022-01-25 LAB — CBC WITH DIFFERENTIAL/PLATELET
Basophils Absolute: 0 10*3/uL (ref 0.0–0.1)
Basophils Relative: 0.7 % (ref 0.0–3.0)
Eosinophils Absolute: 0 10*3/uL (ref 0.0–0.7)
Eosinophils Relative: 0.7 % (ref 0.0–5.0)
HCT: 48 % (ref 39.0–52.0)
Hemoglobin: 16.1 g/dL (ref 13.0–17.0)
Lymphocytes Relative: 18.7 % (ref 12.0–46.0)
Lymphs Abs: 1.2 10*3/uL (ref 0.7–4.0)
MCHC: 33.6 g/dL (ref 30.0–36.0)
MCV: 91.2 fl (ref 78.0–100.0)
Monocytes Absolute: 0.7 10*3/uL (ref 0.1–1.0)
Monocytes Relative: 11.2 % (ref 3.0–12.0)
Neutro Abs: 4.3 10*3/uL (ref 1.4–7.7)
Neutrophils Relative %: 68.7 % (ref 43.0–77.0)
Platelets: 192 10*3/uL (ref 150.0–400.0)
RBC: 5.26 Mil/uL (ref 4.22–5.81)
RDW: 13.5 % (ref 11.5–15.5)
WBC: 6.2 10*3/uL (ref 4.0–10.5)

## 2022-01-25 LAB — BASIC METABOLIC PANEL
BUN: 17 mg/dL (ref 6–23)
CO2: 28 mEq/L (ref 19–32)
Calcium: 9.6 mg/dL (ref 8.4–10.5)
Chloride: 100 mEq/L (ref 96–112)
Creatinine, Ser: 0.9 mg/dL (ref 0.40–1.50)
GFR: 85.97 mL/min (ref >60.00)
Glucose, Bld: 95 mg/dL (ref 70–99)
Potassium: 4.1 mEq/L (ref 3.5–5.1)
Sodium: 136 mEq/L (ref 135–145)

## 2022-01-25 LAB — TSH: TSH: 0.57 u[IU]/mL (ref 0.35–5.50)

## 2022-01-25 NOTE — Progress Notes (Signed)
Established Patient Office Visit  Subjective   Patient ID: Samuel [name], male    DOB: 18-Feb-1951  Age: 71 y.o. MRN: [id number]  Chief Complaint  Patient presents with   Labs Only    HPI   Seen today for medical follow-up and for follow-up labs.  He has longstanding history of hypothyroidism.  He is on levothyroxine 137 mcg daily.  He has history of bicuspid aortic valve with aortic valve replacement 13 years ago and on chronic Coumadin.  Followed through our Coumadin clinic.  He has ADD and takes low-dose Adderall for that.  Also has history of gout which is stable on allopurinol 300 mg daily.  No recent flareups.  Enjoys playing golf.  Trying to lose some weight.  Currently on keto diet.  He has a daughter who lives in [place] who lost her home in the recent fires but fortunately she had no issues with her own health.  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   Dyslipidemia    Gout    Hemorrhoids    HEMORRHOIDS-INTERNAL 12/20/2009   History of diverticulitis 03/2018   HYPERLIPIDEMIA-MIXED 04/01/2009   HYPOTHYROIDISM 08/26/2008   INSOMNIA, TRANSIENT 04/27/2009   Kidney stones    KNEE PAIN 11/02/2008   LATERAL EPICONDYLITIS 06/02/2009   Migraines    ONYCHOMYCOSIS 11/02/2008   PERSONAL HX COLONIC POLYPS 12/20/2009   Sudden visual loss 12/25/2008   TRANSIENT ISCHEMIC ATTACK 01/25/2009   Past Surgical History:  Procedure Laterality Date   CYSTECTOMY     from abdominal wall   MYRINGOTOMY WITH TUBE PLACEMENT Left    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 He has never used smokeless tobacco. He reports current alcohol use of about 14.0 standard drinks of alcohol per week. He reports that he does not use drugs. family history includes Diabetes in his mother; Heart disease in his father. Allergies  Allergen Reactions   Shellfish Allergy Anaphylaxis   Penicillins      REACTION: childhood, reaction unknown    Review of Systems  Constitutional:  Negative for malaise/fatigue.  Eyes:  Negative for blurred vision.  Respiratory:  Negative for shortness of breath.   Cardiovascular:  Negative for chest pain.  Neurological:  Negative for dizziness, weakness and headaches.      Objective:     BP (!) 146/80 (BP Location: Left Arm, Patient Position: Sitting, Cuff Size: Normal)   Pulse 75   Temp 98.4 F (36.9 C) (Oral)   Ht 6\' 1"  (1.854 m)   Wt 232 lb 1.6 oz (105.3 kg)   SpO2 96%   BMI 30.62 kg/m  BP Readings from Last 3 Encounters:  01/25/22 (!) 146/80  11/25/21 (!) 142/80  05/11/21 (!) 142/90   Wt Readings from Last 3 Encounters:  01/25/22 232 lb 1.6 oz (105.3 kg)  11/25/21 233 lb 9.6 oz (106 kg)  05/11/21 237 lb (107.5 kg)      Physical Exam Vitals reviewed.  Constitutional:      Appearance: Normal appearance.  Cardiovascular:     Rate and Rhythm: Normal rate and regular rhythm.  Pulmonary:     Effort: Pulmonary effort is normal.     Breath sounds: Normal breath sounds. No wheezing or rales.  Musculoskeletal:     Right lower leg: No edema.     Left lower  leg: No edema.  Neurological:     Mental Status: He is alert.      No results found for any visits on 01/25/22.  Last CBC Lab Results  Component Value Date   WBC 6.7 06/21/2020   HGB 16.1 06/21/2020   HCT 47.1 06/21/2020   MCV 91.1 06/21/2020   RDW 13.6 06/21/2020   PLT 189.0 06/21/2020   Last metabolic panel Lab Results  Component Value Date   GLUCOSE 91 06/21/2020   NA 136 06/21/2020   K 4.0 06/21/2020   CL 100 06/21/2020   CO2 30 06/21/2020   BUN 15 06/21/2020   CREATININE 0.84 06/21/2020   CALCIUM 9.4 06/21/2020   PROT 7.0 10/01/2020   ALBUMIN 4.7 10/01/2020   BILITOT 1.1 10/01/2020   ALKPHOS 62 10/01/2020   AST 34 10/01/2020   ALT 40 10/01/2020   Last lipids Lab Results  Component Value Date   CHOL 171 07/26/2020   HDL 44 07/26/2020   LDLCALC  88 07/26/2020   LDLDIRECT 137.9 06/21/2012   TRIG 232 (H) 07/26/2020   CHOLHDL 3.9 07/26/2020   Last thyroid functions Lab Results  Component Value Date   TSH 1.04 12/29/2020      The 10-year ASCVD risk score (Arnett DK, et al., 2019) is: 23.3%    Assessment & Plan:    #1 hypothyroidism.  Patient on levothyroxine 137 mcg daily.  Recheck TSH.  Wait on labs before refilling medication.  He is requesting refills at [facility] pharmacy  #2 history of aortic valve replacement secondary to bicuspid aortic valve.  On chronic Coumadin.  Continue close follow-up with Coumadin clinic.  Check CBC and basic metabolic panel today.  #3 ADD.  Stable on low-dose Adderall.  #4 mildly elevated blood pressure.  Not currently monitoring at home.  Continue weight loss efforts.  Watch sodium intake.  Monitor regularly and be in touch if consistently greater than 140/90    No follow-ups on file.    [name], MD

## 2022-01-25 NOTE — Patient Instructions (Signed)
Monitor blood pressure and be in touch if consistently > 140/90.   

## 2022-02-01 ENCOUNTER — Ambulatory Visit (INDEPENDENT_AMBULATORY_CARE_PROVIDER_SITE_OTHER): Payer: Medicare Other

## 2022-02-01 DIAGNOSIS — Z7901 Long term (current) use of anticoagulants: Secondary | ICD-10-CM

## 2022-02-01 LAB — POCT INR: INR: 1.8 — AB (ref 2.0–3.0)

## 2022-02-01 NOTE — Patient Instructions (Addendum)
Pre visit review using our clinic review tool, if applicable. No additional management support is needed unless otherwise documented below in the visit note.  Increase does today to take 3 1/2 tablets and then change weekly dose to take 2 1/2 tablets daily except take 2 tablets on Mon, Wed, and Friday. Recheck in 3 weeks.

## 2022-02-01 NOTE — Progress Notes (Signed)
Spread weekly dose out more consistently but weekly dosage remained the same.   Increase does today to take 3 1/2 tablets and then change weekly dose to take 2 1/2 tablets daily except take 2 tablets on Mon, Wed, and Friday. Recheck in 3 weeks.

## 2022-02-07 DIAGNOSIS — M79676 Pain in unspecified toe(s): Secondary | ICD-10-CM | POA: Diagnosis not present

## 2022-02-07 DIAGNOSIS — B351 Tinea unguium: Secondary | ICD-10-CM | POA: Diagnosis not present

## 2022-02-19 ENCOUNTER — Other Ambulatory Visit: Payer: Self-pay | Admitting: Family Medicine

## 2022-02-19 DIAGNOSIS — Z7901 Long term (current) use of anticoagulants: Secondary | ICD-10-CM

## 2022-02-21 NOTE — Telephone Encounter (Signed)
Pt has been compliant with warfarin management and PCP appointments. Refill sent.

## 2022-02-22 ENCOUNTER — Other Ambulatory Visit: Payer: Self-pay | Admitting: Family Medicine

## 2022-02-22 ENCOUNTER — Telehealth: Payer: Self-pay | Admitting: Family Medicine

## 2022-02-22 ENCOUNTER — Ambulatory Visit (INDEPENDENT_AMBULATORY_CARE_PROVIDER_SITE_OTHER): Payer: Medicare Other

## 2022-02-22 DIAGNOSIS — Z7901 Long term (current) use of anticoagulants: Secondary | ICD-10-CM

## 2022-02-22 LAB — POCT INR: INR: 2.6 (ref 2.0–3.0)

## 2022-02-22 MED ORDER — AMPHETAMINE-DEXTROAMPHETAMINE 10 MG PO TABS
10.0000 mg | ORAL_TABLET | Freq: Two times a day (BID) | ORAL | 0 refills | Status: DC
Start: 2022-02-22 — End: 2022-04-04

## 2022-02-22 NOTE — Addendum Note (Signed)
Addended by: Kristian Covey on: 02/22/2022 08:28 PM   Modules accepted: Orders

## 2022-02-22 NOTE — Telephone Encounter (Signed)
Pt req Rx refill for amphetamine-dextroamphetamine (ADDERALL) 10 MG tablet sent to  [id number] - [place], [place] - [address] Phone:  [phone]  Fax:  [phone]     Please advise.

## 2022-02-22 NOTE — Patient Instructions (Addendum)
Pre visit review using our clinic review tool, if applicable. No additional management support is needed unless otherwise documented below in the visit note.  Continue to take 2 1/2 tablets daily except take 2 tablets on Mon, Wed, and Friday. Recheck in 6 weeks.  

## 2022-02-22 NOTE — Progress Notes (Signed)
Continue to take 2 1/2 tablets daily except take 2 tablets on Mon, Wed, and Friday. Recheck in 6 weeks.  

## 2022-03-06 ENCOUNTER — Telehealth: Payer: Self-pay | Admitting: Family Medicine

## 2022-03-06 NOTE — Telephone Encounter (Signed)
Left message for patient to call back and schedule Medicare Annual Wellness Visit (AWV) either virtually or in office. Left  my jabber number [phone]   Last AWV 03/25/21  ; please schedule at anytime with [facility] Nurse Health Advisor 1 or 2    

## 2022-04-04 ENCOUNTER — Other Ambulatory Visit: Payer: Self-pay | Admitting: Family Medicine

## 2022-04-04 ENCOUNTER — Telehealth: Payer: Self-pay | Admitting: Family Medicine

## 2022-04-04 MED ORDER — AMPHETAMINE-DEXTROAMPHETAMINE 10 MG PO TABS: 10.0000 mg | ORAL_TABLET | Freq: Two times a day (BID) | ORAL | 0 refills | Status: DC

## 2022-04-04 NOTE — Telephone Encounter (Signed)
Pt call and stated he need a refill on amphetamine-dextroamphetamine (ADDERALL) 10 MG tablet [[id number]] sent to  [id number] - [place], [place] Phone:  [phone]  Fax:  [phone]

## 2022-04-05 ENCOUNTER — Ambulatory Visit (INDEPENDENT_AMBULATORY_CARE_PROVIDER_SITE_OTHER): Payer: Medicare Other

## 2022-04-05 DIAGNOSIS — Z7901 Long term (current) use of anticoagulants: Secondary | ICD-10-CM

## 2022-04-05 LAB — POCT INR: INR: 3.3 — AB (ref 2.0–3.0)

## 2022-04-05 NOTE — Progress Notes (Signed)
No dosing changes at this time per pt request. Pt advised to eat an extra serving of greens within the next 48 hours. Pt states he will have a salad with dinner.   Then continue to take 2 1/2 tablets daily except take 2 tablets on Mon, Wed, and Friday. Recheck in 4 weeks.

## 2022-04-05 NOTE — Patient Instructions (Addendum)
Continue to take 2 1/2 tablets daily except take 2 tablets on Mon, Wed, and Friday. Recheck in 4 weeks.  Eat an extra serving of greens within the next 48 hours.

## 2022-04-07 ENCOUNTER — Telehealth: Payer: Self-pay | Admitting: Family Medicine

## 2022-04-07 NOTE — Telephone Encounter (Signed)
Left message for patient to call back and schedule Medicare Annual Wellness Visit (AWV) either virtually or in office. Left  my Samuel [name] number [phone]   Last AWV  03/25/21 please schedule with Nurse Health Adviser   45 min for awv-i and in office appointments 30 min for awv-s  phone/virtual appointments

## 2022-04-10 ENCOUNTER — Other Ambulatory Visit: Payer: Self-pay | Admitting: Family Medicine

## 2022-04-30 ENCOUNTER — Other Ambulatory Visit: Payer: Self-pay | Admitting: Family Medicine

## 2022-04-30 DIAGNOSIS — M109 Gout, unspecified: Secondary | ICD-10-CM

## 2022-05-01 ENCOUNTER — Ambulatory Visit (INDEPENDENT_AMBULATORY_CARE_PROVIDER_SITE_OTHER): Payer: Medicare Other

## 2022-05-01 DIAGNOSIS — Z7901 Long term (current) use of anticoagulants: Secondary | ICD-10-CM | POA: Diagnosis not present

## 2022-05-01 LAB — POCT INR: INR: 3.3 — AB (ref 2.0–3.0)

## 2022-05-01 NOTE — Patient Instructions (Signed)
Eat an extra serving of greens within the next 48 hours.  Continue to take 2 1/2 tablets daily except take 2 tablets on Mon, Wed, and Friday. Recheck in 6 weeks, on 06/14/21 at 2:00

## 2022-05-01 NOTE — Progress Notes (Signed)
Pt reports missing last night's dose of warfarin, but he took it this am.  No dosing changes at this time per pt request. Pt advised to eat an extra serving of greens within the next 48 hours.  Continue to take 2 1/2 tablets daily except take 2 tablets on Mon, Wed, and Friday. Recheck in 6 weeks.

## 2022-05-02 MED ORDER — ALLOPURINOL 300 MG PO TABS: 300.0000 mg | ORAL_TABLET | Freq: Every day | ORAL | 0 refills | Status: DC

## 2022-05-11 ENCOUNTER — Telehealth: Payer: Self-pay | Admitting: Family Medicine

## 2022-05-11 NOTE — Telephone Encounter (Signed)
Left message for patient to call back and schedule Medicare Annual Wellness Visit (AWV) either virtually or in office. Left  my Zachery Conch number [phone]   Last AWV 03/25/21 please schedule with Nurse Health Adviser   45 min for awv-i and in office appointments 30 min for awv-s  phone/virtual appointments

## 2022-05-16 ENCOUNTER — Other Ambulatory Visit: Payer: Self-pay | Admitting: Family Medicine

## 2022-05-16 DIAGNOSIS — Z7901 Long term (current) use of anticoagulants: Secondary | ICD-10-CM

## 2022-05-21 ENCOUNTER — Encounter: Payer: Self-pay | Admitting: Family Medicine

## 2022-05-21 ENCOUNTER — Other Ambulatory Visit: Payer: Self-pay | Admitting: Family Medicine

## 2022-05-22 MED ORDER — AMPHETAMINE-DEXTROAMPHETAMINE 10 MG PO TABS: 10.0000 mg | ORAL_TABLET | Freq: Two times a day (BID) | ORAL | 0 refills | Status: DC

## 2022-05-23 ENCOUNTER — Telehealth: Payer: Self-pay | Admitting: Family Medicine

## 2022-05-23 NOTE — Telephone Encounter (Signed)
Returned patients call   Patient left message wanting me to call hime 05/22/22

## 2022-05-25 ENCOUNTER — Telehealth: Payer: Self-pay

## 2022-05-25 NOTE — Telephone Encounter (Signed)
Unsuccessful attempt to reach patient on preferred number listed in notes for scheduled AWV Left message on voicemail. Okay to reschedule Patient returned call @ 1:49pm stated he canceled this appt 4 days prior to today. Patient would like a call back to reschedule.

## 2022-05-30 DIAGNOSIS — M47812 Spondylosis without myelopathy or radiculopathy, cervical region: Secondary | ICD-10-CM | POA: Diagnosis not present

## 2022-05-30 DIAGNOSIS — M5412 Radiculopathy, cervical region: Secondary | ICD-10-CM | POA: Diagnosis not present

## 2022-05-30 DIAGNOSIS — G894 Chronic pain syndrome: Secondary | ICD-10-CM | POA: Diagnosis not present

## 2022-05-30 DIAGNOSIS — M5030 Other cervical disc degeneration, unspecified cervical region: Secondary | ICD-10-CM | POA: Diagnosis not present

## 2022-06-06 ENCOUNTER — Telehealth (INDEPENDENT_AMBULATORY_CARE_PROVIDER_SITE_OTHER): Payer: Medicare Other | Admitting: Family Medicine

## 2022-06-06 DIAGNOSIS — Z0000 Encounter for general adult medical examination without abnormal findings: Secondary | ICD-10-CM | POA: Diagnosis not present

## 2022-06-06 NOTE — Patient Instructions (Addendum)
I really enjoyed getting to talk with you today! I am available on Tuesdays and Thursdays for virtual visits if you have any questions or concerns, or if I can be of any further assistance.   CHECKLIST FROM ANNUAL WELLNESS VISIT:  -Follow up (please call to schedule if not scheduled after visit):  -Inperson visit with your Primary Doctor office: in next few months  - call your Gastroenterologist about colonoscopy -yearly for annual wellness visit with primary care office  Here is a list of your preventive care/health maintenance measures and the plan for each if any are due:  Health Maintenance  Topic Date Due   Hepatitis C Screening  Never done   Zoster Vaccines- Shingrix (1 of 2) Never done   Pneumonia Vaccine 55+ Years old (1 - PCV) Never done   COVID-19 Vaccine (4 - 2023-24 season) 02/10/2022   Medicare Annual Wellness (AWV)  03/26/2023   DTaP/Tdap/Td (2 - Td or Tdap) 11/06/2023   COLONOSCOPY (Pts 45-74yrs Insurance coverage will need to be confirmed)  Call your gastroenterologist   HPV VACCINES  Aged Out   INFLUENZA VACCINE  Discontinued    -See a dentist at least yearly  -Get your eyes checked and then per your eye specialist's recommendations  -Other issues addressed today: -  -I have included below further information regarding a healthy whole foods based diet, physical activity guidelines for adults, stress management and opportunities for social connections. I hope you find this information useful.   -----------------------------------------------------------------------------------------------------------------------------------------------------------------------------------------------------------------------------------------------------------  NUTRITION: -eat real food: lots of colorful vegetables (half the plate) and fruits -5-7 servings of vegetables and fruits per day (fresh or steamed is best), exp. 2 servings of vegetables with lunch and dinner and 2 servings  of fruit per day. Berries and greens such as kale and collards are great choices.  -consume on a regular basis: whole grains (make sure first ingredient on label contains the word "whole"), fresh fruits, fish, nuts, seeds, healthy oils (such as olive oil, avocado oil, grape seed oil) -may eat small amounts of dairy and lean meat on occasion, but avoid processed meats such as ham, bacon, lunch meat, etc. -drink water -try to avoid fast food and pre-packaged foods, processed meat -most experts advise limiting sodium to < 2300mg  per day, should limit further is any chronic conditions such as high blood pressure, heart disease, diabetes, etc. The American Heart Association advised that < 1500mg  is is ideal -try to avoid foods that contain any ingredients with names you do not recognize  -try to avoid sugar/sweets (except for the natural sugar that occurs in fresh fruit) -try to avoid sweet drinks -try to avoid white rice, white bread, pasta (unless whole grain), white or yellow potatoes  EXERCISE GUIDELINES FOR ADULTS: -if you wish to increase your physical activity, do so gradually and with the approval of your doctor -STOP and seek medical care immediately if you have any chest pain, chest discomfort or trouble breathing when starting or increasing exercise  -move and stretch your body, legs, feet and arms when sitting for long periods -Physical activity guidelines for optimal health in adults: -least 150 minutes per week of aerobic exercise (can talk, but not sing) once approved by your doctor, 20-30 minutes of sustained activity or two 10 minute episodes of sustained activity every day.  -resistance training at least 2 days per week if approved by your doctor -balance exercises 3+ days per week:   Stand somewhere where you have something sturdy to hold onto if  you lose balance.    1) lift up on toes, start with 5x per day and work up to 20x   2) stand and lift on leg straight out to the side so  that foot is a few inches of the floor, start with 5x each side and work up to 20x each side   3) stand on one foot, start with 5 seconds each side and work up to 20 seconds on each side  If you need ideas or help with getting more active:  -Silver sneakers https://tools.silversneakers.com  -Walk with a Doc: http://www.duncan-williams.com/  -try to include resistance (weight lifting/strength building) and balance exercises twice per week: or the following link for ideas: http://castillo-powell.com/  BuyDucts.dk  STRESS MANAGEMENT: -can try meditating, or just sitting quietly with deep breathing while intentionally relaxing all parts of your body for 5 minutes daily -if you need further help with stress, anxiety or depression please follow up with your primary doctor or contact the wonderful folks at [facility]: [phone]  SOCIAL CONNECTIONS: -options in [place] if you wish to engage in more social and exercise related activities:  -Silver sneakers https://tools.silversneakers.com  -Walk with a Doc: http://www.duncan-williams.com/  -Check out the [facility] Active Adults 50+ section on the [place] [employer] (hiking clubs, book clubs, cards and games, chess, exercise classes, aquatic classes and much more) - see the website for details: https://www.[place].gov/departments/parks-recreation/active-adults50  -YouTube has lots of exercise videos for different ages and abilities as well  -[facility] (a variety of indoor and outdoor inperson activities for adults). [phone]. [address]  -Virtual Online Classes (a variety of topics): see seniorplanet.org or call [phone]  -consider volunteering at a school, hospice center, church, senior center or elsewhere  ADVANCED HEALTHCARE DIRECTIVES:  Everyone should have advanced health care  directives in place. This is so that you get the care you want, should you ever be in a situation where you are unable to make your own medical decisions.   From the [place] Advanced Directive Website: "Advance Health Care Directives are legal documents in which you give written instructions about your health care if, in the future, you cannot speak for yourself.   A health care power of attorney allows you to name a person you trust to make your health care decisions if you cannot make them yourself. A declaration of a desire for a natural death (or living will) is document, which states that you desire not to have your life prolonged by extraordinary measures if you have a terminal or incurable illness or if you are in a vegetative state. An advance instruction for mental health treatment makes a declaration of instructions, information and preferences regarding your mental health treatment. It also states that you are aware that the advance instruction authorizes a mental health treatment provider to act according to your wishes. It may also outline your consent or refusal of mental health treatment. A declaration of an anatomical gift allows anyone over the age of 3 to make a gift by will, organ donor card or other document."   Please see the following website or an elder law attorney for forms, FAQs and for completion of advanced directives: [place] Health Care Directives Advance Health Care Directives (http://guzman.com/)  Or copy and paste the following to your web browser: PoshChat.fi

## 2022-06-06 NOTE — Progress Notes (Signed)
PATIENT CHECK-IN and HEALTH RISK ASSESSMENT QUESTIONNAIRE:  -completed by phone/video for upcoming Medicare Preventive Visit  Pre-Visit Check-in: 1)Vitals (height, wt, BP, etc) - record in vitals section for visit on day of visit 2)Review and Update Medications, Allergies PMH, Surgeries, Social history in Epic 3)Hospitalizations in the last year with date/reason? none  4)Review and Update Care Team (patient's specialists) in Epic 5) Complete PHQ9 in Epic  6) Complete Fall Screening in Epic 7)Review all Health Maintenance Due and order under PCP if not done.  Medicare Wellness Patient Questionnaire:  Answer theses question about your habits: Do you drink alcohol? Some - mostly with events. If yes, how many drinks do you have a day? 2-3 at an event - but he plans to cut out completely Have you ever smoked?yes  Quit date if applicable? 1985  How many packs a day do/did you smoke? 1 pack a day Do you use smokeless tobacco?no Do you use an illicit drugs?no Do you exercises? Yes Golf and walking IF so, what type and how many days/minutes per week?2 times a week, 4-5 hours of activity Are you sexually active? No, does not want testing Mostly eats at home, eats a lot of vegetables Typical breakfast Coffee Typical lunch sandwich Typical dinner meat and 2 vegetables doesn't eat after 9 am  Typical snacks: cheese and crackers   Beverages: Coffee crangrap and mostly water  Answer theses question about you: Can you perform most household chores? Yes  Do you find it hard to follow a conversation in a noisy room? Yes, but has hearing aids Do you often ask people to speak up or repeat themselves?yes  Do you feel that you have a problem with memory? Occasionally, but does not feel is a problem - but still feels is doing well  Do you balance your checkbook and or bank acounts? Yes  Do you feel safe at home? Yes  Last dentist visit? 3 months ago  Do you need assistance with any of the following:  Please note if so   Driving? No   Feeding yourself? no  Getting from bed to chair? no  Getting to the toilet? no  Bathing or showering? no  Dressing yourself?no   Managing money? No   Climbing a flight of stairs? no  Preparing meals? no    Do you have Advanced Directives in place (Living Will, Healthcare Power or Attorney)?  On the list to do - wife knows how to do this   Last eye Exam and location?6 months again    Do you currently use prescribed or non-prescribed narcotic or opioid pain medications?yes prescribed opiods  Do you have a history or close family history of breast, ovarian, tubal or peritoneal cancer or a family member with BRCA (breast cancer susceptibility 1 and 2) gene mutations? unknown  Nurse/Assistant Credentials/time stamp:   ----------------------------------------------------------------------------------------------------------------------------------------------------------------------------------------------------------------------    [place] (Welcome to Medicare, initial annual wellness or annual wellness exam)  Virtual Visit via Video Note  I connected with Samuel [name]  on 06/06/22 by a video enabled telemedicine application and verified that I am speaking with the correct person using two identifiers.  Location patient: home Location provider:work or home office Persons participating in the virtual visit: patient, provider  Concerns and/or follow up today: none, reports has been doing weel.    See HM section in Epic for other details of completed HM.    ROS: negative for report of fevers, unintentional weight loss, vision changes, vision  loss, hearing loss or change, chest pain, sob, hemoptysis, melena, hematochezia, hematuria, genital discharge or lesions, falls, bleeding or bruising, loc, thoughts of suicide or self harm, memory loss  Patient-completed extensive health risk assessment - reviewed and  discussed with the patient: See Health Risk Assessment completed with patient prior to the visit either above or in recent phone note. This was reviewed in detailed with the patient today and appropriate recommendations, orders and referrals were placed as needed per Summary below and patient instructions.   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   Dyslipidemia    Gout    Hemorrhoids    HEMORRHOIDS-INTERNAL 12/20/2009   History of diverticulitis 03/2018   HYPERLIPIDEMIA-MIXED 04/01/2009   HYPOTHYROIDISM 08/26/2008   INSOMNIA, TRANSIENT 04/27/2009   Kidney stones    KNEE PAIN 11/02/2008   LATERAL EPICONDYLITIS 06/02/2009   Migraines    ONYCHOMYCOSIS 11/02/2008   PERSONAL HX COLONIC POLYPS 12/20/2009   Sudden visual loss 12/25/2008   TRANSIENT ISCHEMIC ATTACK 01/25/2009    Past Surgical History:  Procedure Laterality Date   CYSTECTOMY     from abdominal wall   MYRINGOTOMY WITH TUBE PLACEMENT Left    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   TONSILLECTOMY      Social History   Socioeconomic History   Marital status: Married    Spouse name: Not on file   Number of children: Not on file   Years of education: Not on file   Highest education level: Not on file  Occupational History   Not on file  Tobacco Use   Smoking status: Former   Smokeless tobacco: Never  Vaping Use   Vaping Use: Never used  Substance and Sexual Activity   Alcohol use: Yes    Alcohol/week: 14.0 standard drinks of alcohol    Types: 14 Glasses of wine per week    Comment: couple glasses of wine each day   Drug use: No   Sexual activity: Not  on file  Other Topics Concern   Not on file  Social History Narrative   Not on file   Social Determinants of Health   Financial Resource Strain: Low Risk  (12/28/2020)   Overall Financial Resource Strain (CARDIA)    Difficulty of Paying Living Expenses: Not hard at all  Food Insecurity: No Food Insecurity (03/25/2021)   Hunger Vital Sign    Worried About Running Out of Food in the Last Year: Never true    Ran Out of Food in the Last Year: Never true  Transportation Needs: No Transportation Needs (03/25/2021)   PRAPARE - Hydrologist (Medical): No    Lack of Transportation (Non-Medical): No  Physical Activity: Insufficiently Active (03/25/2021)   Exercise Vital Sign    Days of Exercise per Week: 3 days    Minutes of Exercise per Session: 40 min  Stress: No Stress Concern Present (03/25/2021)   [place]    Feeling of Stress : Only a little  Social Connections: Moderately Integrated (03/25/2021)   Social Connection and Isolation Panel [  NHANES]    Frequency of Communication with Friends and Family: Three times a week    Frequency of Social Gatherings with Friends and Family: Three times a week    Attends Religious Services: Never    Active Member of Clubs or Organizations: Yes    Attends Archivist Meetings: More than 4 times per year    Marital Status: Married  Human resources officer Violence: Not At Risk (03/25/2021)   Humiliation, Afraid, Rape, and Kick questionnaire    Fear of Current or Ex-Partner: No    Emotionally Abused: No    Physically Abused: No    Sexually Abused: No    Family History  Problem Relation Age of Onset   Diabetes Mother    Heart disease Father        valve problem    Current Outpatient Medications on File Prior to Visit  Medication Sig Dispense Refill   allopurinol (ZYLOPRIM) 300 MG tablet Take 1 tablet (300 mg total) by mouth daily. 90 tablet 0    amphetamine-dextroamphetamine (ADDERALL) 10 MG tablet Take 1 tablet (10 mg total) by mouth 2 (two) times daily. 60 tablet 0   Cholecalciferol 50 MCG (2000 UT) TBDP Take 2 tablets by mouth daily. 30 tablet    levothyroxine (SYNTHROID) 137 MCG tablet TAKE 1 TABLET BY MOUTH EVERY DAY BEFORE BREAKFAST 90 tablet 0   oxyCODONE (OXY IR/ROXICODONE) 5 MG immediate release tablet oxycodone 5 mg tablet  Take 1 tablet twice a day by oral route as needed.     sildenafil (VIAGRA) 100 MG tablet Take 0.5-1 tablets (50-100 mg total) by mouth daily as needed for erectile dysfunction. 30 tablet 5   terbinafine (LAMISIL) 250 MG tablet Take 1 tablet (250 mg total) by mouth daily. 90 tablet 0   warfarin (COUMADIN) 5 MG tablet TAKE 2 1/2 TABLETS DAILY EXCEPT TAKE 2 TABLETS ON MONDAYS, WEDNESDAYS, AND FRIDAYS. OR TAKE AS DIRECTED BY ANTICOAGULATION CLINIC 210 tablet 1   No current facility-administered medications on file prior to visit.    Allergies  Allergen Reactions   Shellfish Allergy Anaphylaxis   Penicillins     REACTION: childhood, reaction unknown       Physical Exam There were no vitals filed for this visit. Estimated body mass index is 30.62 kg/m as calculated from the following:   Height as of 01/25/22: 6' 1" (1.854 m).   Weight as of 01/25/22: 232 lb 1.6 oz (105.3 kg).  EKG (optional): deferred due to virtual visit  GENERAL: alert, oriented, appears well and in no acute distress; visual acuity grossly intact, full vision exam deferred due to pandemic and/or virtual encounter  PSYCH/NEURO: pleasant and cooperative, no obvious depression or anxiety, speech and thought processing grossly intact, Cognitive function grossly intact  Flowsheet Row Clinical Support from 03/24/2020 in [place] [facility]  PHQ-9 Total Score 4           06/06/2022   10:29 AM 03/25/2021   11:42 AM 03/25/2021   11:34 AM 03/24/2020    2:12 PM 02/13/2018    3:03 PM  Depression screen PHQ 2/9  Decreased  Interest 0 0 0 1 0  Down, Depressed, Hopeless 0 0 0 1 0  PHQ - 2 Score 0 0 0 2 0  Altered sleeping    0   Tired, decreased energy    1   Change in appetite    0   Feeling bad or failure about yourself     1  Trouble concentrating    0   Moving slowly or fidgety/restless    0   Suicidal thoughts    0   PHQ-9 Score    4   Difficult doing work/chores    Not difficult at all        11/03/2019    1:32 PM 03/24/2020    2:11 PM 03/25/2021   11:42 AM 05/11/2021    3:12 PM 06/06/2022   10:29 AM  Fall Risk  Falls in the past year? 0 0 0 0 0  Was there an injury with Fall? 0 0 0 0 0  Fall Risk Category Calculator 0 0 0 0 0  Fall Risk Category _0   Patient Fall Risk Level  Low fall risk Low fall risk  Low fall risk  Patient at Risk for Falls Due to  No Fall Risks   No Fall Risks  Fall risk Follow up  Falls evaluation completed;Falls prevention discussed Falls evaluation completed  Falls evaluation completed     SUMMARY AND PLAN:  Medicare annual wellness visit, subsequent   Discussed applicable health maintenance/preventive health measures and advised and referred or ordered per patient preferences:  Health Maintenance  Topic Date Due   Hepatitis C Screening  Never done   Zoster Vaccines- Shingrix (1 of 2) Never done   Pneumonia Vaccine 20+ Years old (1 - PCV) Never done   COVID-19 Vaccine (4 - 2023-24 season) 02/10/2022, he reports had two doses an done booster   Medicare Annual Wellness (AWV)  06/07/2023   DTaP/Tdap/Td (2 - Td or Tdap) 11/06/2023   COLONOSCOPY (Pts 45-58yr Insurance coverage will need to be confirmed)  10/21/2024 - he reports he is in communication with his GI office and needs to schedule early due to polyps. He agrees to arrange with them as he has to plan in terms of his coumadin.   HPV VACCINES  Aged Out   INFLUENZA VACCINE  Discontinued    Education and counseling on the following was provided based on the above review of health and a  plan/checklist for the patient, along with additional information discussed, was provided for the patient in the patient instructions :  -Advised on importance of and resources for completing advanced directives, he reports wife was hospice nurse so they know how to do this - on to do list. Provided info.  -Advised and counseled on maintaining healthy weight and healthy lifestyle - including the importance of a health diet, regular physical activity. -Advised and counseled on a whole foods based healthy diet and regular exercise: discussed a heart healthy whole foods based diet at length. A summary of a healthy diet was provided in the Patient Instructions.Recommended regular exercise and discussed options within the community.  -Advise yearly dental visits at minimum and regular eye exams -Advised and counseled on alcohol use, risks, limits  Follow up: see patient instructions   Patient Instructions  I really enjoyed getting to talk with you today! I am available on Tuesdays and Thursdays for virtual visits if you have any questions or concerns, or if I can be of any further assistance.   CHECKLIST FROM ANNUAL WELLNESS VISIT:  -Follow up (please call to schedule if not scheduled after visit):  -Inperson visit with your Primary Doctor office: in next few months  - call your Gastroenterologist about colonoscopy -yearly for annual wellness visit with primary care office  Here is a list of your preventive care/health maintenance measures and the plan  for each if any are due:  Health Maintenance  Topic Date Due   Hepatitis C Screening  Never done   Zoster Vaccines- Shingrix (1 of 2) Never done   Pneumonia Vaccine 13+ Years old (1 - PCV) Never done   COVID-19 Vaccine (4 - 2023-24 season) 02/10/2022   Medicare Annual Wellness (AWV)  03/26/2023   DTaP/Tdap/Td (2 - Td or Tdap) 11/06/2023   COLONOSCOPY (Pts 45-29yr Insurance coverage will need to be confirmed)  Call your gastroenterologist    HPV VACCINES  Aged Out   INFLUENZA VACCINE  Discontinued    -See a dentist at least yearly  -Get your eyes checked and then per your eye specialist's recommendations  -Other issues addressed today: -  -I have included below further information regarding a healthy whole foods based diet, physical activity guidelines for adults, stress management and opportunities for social connections. I hope you find this information useful.   -----------------------------------------------------------------------------------------------------------------------------------------------------------------------------------------------------------------------------------------------------------  NUTRITION: -eat real food: lots of colorful vegetables (half the plate) and fruits -5-7 servings of vegetables and fruits per day (fresh or steamed is best), exp. 2 servings of vegetables with lunch and dinner and 2 servings of fruit per day. Berries and greens such as kale and collards are great choices.  -consume on a regular basis: whole grains (make sure first ingredient on label contains the word "whole"), fresh fruits, fish, nuts, seeds, healthy oils (such as olive oil, avocado oil, grape seed oil) -may eat small amounts of dairy and lean meat on occasion, but avoid processed meats such as ham, bacon, lunch meat, etc. -drink water -try to avoid fast food and pre-packaged foods, processed meat -most experts advise limiting sodium to < 23053mper day, should limit further is any chronic conditions such as high blood pressure, heart disease, diabetes, etc. The American Heart Association advised that < 150034ms is ideal -try to avoid foods that contain any ingredients with names you do not recognize  -try to avoid sugar/sweets (except for the natural sugar that occurs in fresh fruit) -try to avoid sweet drinks -try to avoid white rice, white bread, pasta (unless whole grain), white or yellow potatoes  EXERCISE  GUIDELINES FOR ADULTS: -if you wish to increase your physical activity, do so gradually and with the approval of your doctor -STOP and seek medical care immediately if you have any chest pain, chest discomfort or trouble breathing when starting or increasing exercise  -move and stretch your body, legs, feet and arms when sitting for long periods -Physical activity guidelines for optimal health in adults: -least 150 minutes per week of aerobic exercise (can talk, but not sing) once approved by your doctor, 20-30 minutes of sustained activity or two 10 minute episodes of sustained activity every day.  -resistance training at least 2 days per week if approved by your doctor -balance exercises 3+ days per week:   Stand somewhere where you have something sturdy to hold onto if you lose balance.    1) lift up on toes, start with 5x per day and work up to 20x   2) stand and lift on leg straight out to the side so that foot is a few inches of the floor, start with 5x each side and work up to 20x each side   3) stand on one foot, start with 5 seconds each side and work up to 20 seconds on each side  If you need ideas or help with getting more active:  -Silver sneakers https://tools.silversneakers.com  -Walk  with a Doc: http://stephens-thompson.biz/  -try to include resistance (weight lifting/strength building) and balance exercises twice per week: or the following link for ideas: ChessContest.fr  UpdateClothing.com.cy  STRESS MANAGEMENT: -can try meditating, or just sitting quietly with deep breathing while intentionally relaxing all parts of your body for 5 minutes daily -if you need further help with stress, anxiety or depression please follow up with your primary doctor or contact the wonderful folks at [place]: [place]: -options in [place] if you wish to  engage in more social and exercise related activities:  -Silver sneakers https://tools.silversneakers.com  -Walk with a Doc: http://stephens-thompson.biz/  -Check out the [place] 50+ section on the [place] [employer] (hiking clubs, book clubs, cards and games, chess, exercise classes, aquatic classes and much more) - see the website for details: https://www.[place].gov/departments/parks-recreation/active-adults50  -YouTube has lots of exercise videos for different ages and abilities as well  -[place] (a variety of indoor and outdoor inperson activities for adults). [phone]. [address].  -Virtual Online Classes (a variety of topics): see seniorplanet.org or call [phone]  -consider volunteering at a school, hospice center, church, senior center or elsewhere  ADVANCED HEALTHCARE DIRECTIVES:  Everyone should have advanced health care directives in place. This is so that you get the care you want, should you ever be in a situation where you are unable to make your own medical decisions.   From the [place] Advanced Directive Website: "[employer] are legal documents in which you give written instructions about your health care if, in the future, you cannot speak for yourself.   A health care power of attorney allows you to name a person you trust to make your health care decisions if you cannot make them yourself. A declaration of a desire for a natural death (or living will) is document, which states that you desire not to have your life prolonged by extraordinary measures if you have a terminal or incurable illness or if you are in a vegetative state. An advance instruction for mental health treatment makes a declaration of instructions, information and preferences regarding your mental health treatment. It also states that you are aware that the advance instruction authorizes a mental health treatment provider to  act according to your wishes. It may also outline your consent or refusal of mental health treatment. A declaration of an anatomical gift allows anyone over the age of 28 to make a gift by will, organ donor card or other document."   Please see the following website or an elder law attorney for forms, FAQs and for completion of advanced directives: [place] Secretary [employer] [place] ([email or web address])  Or copy and paste the following to your web browser: [email or web address]           Lucretia Kern, DO

## 2022-06-11 IMAGING — US US ABDOMEN COMPLETE
1 series · 14 of 25 positions shown · non-contrast
Comparison: 03/29/2009 and prior.

CLINICAL DATA: intermittent RUQ abdominal pain

EXAM:
ABDOMEN ULTRASOUND COMPLETE

[Series 1: us abdomen complete · 0.19mm/px · 14 of 95 slices shown]
[im 1/95]
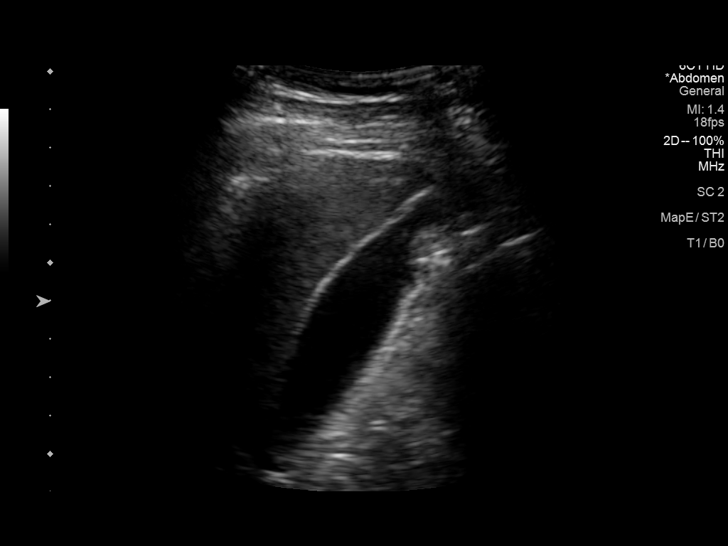
[im 8/95]
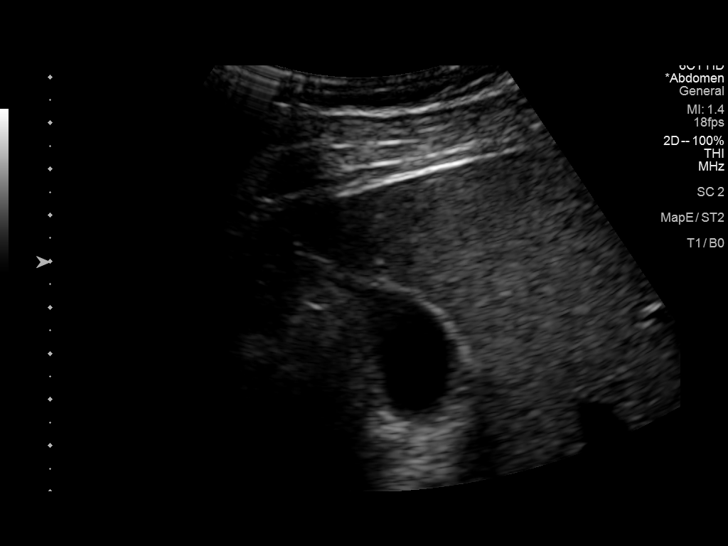
[im 16/95]
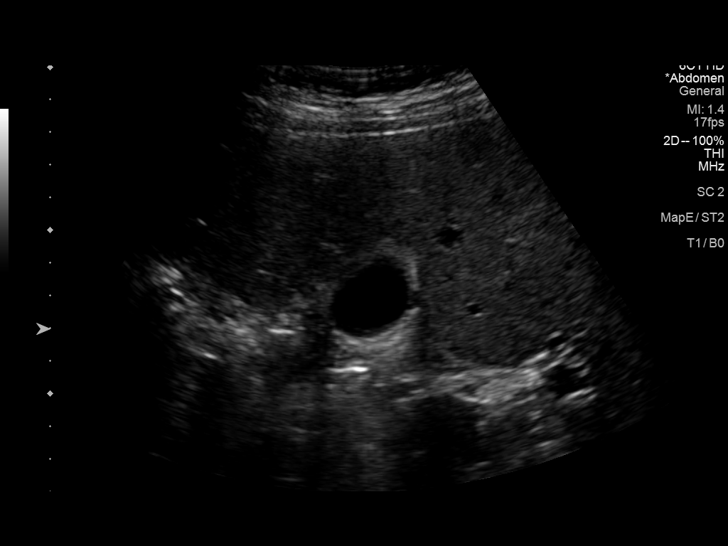
[im 24/95]
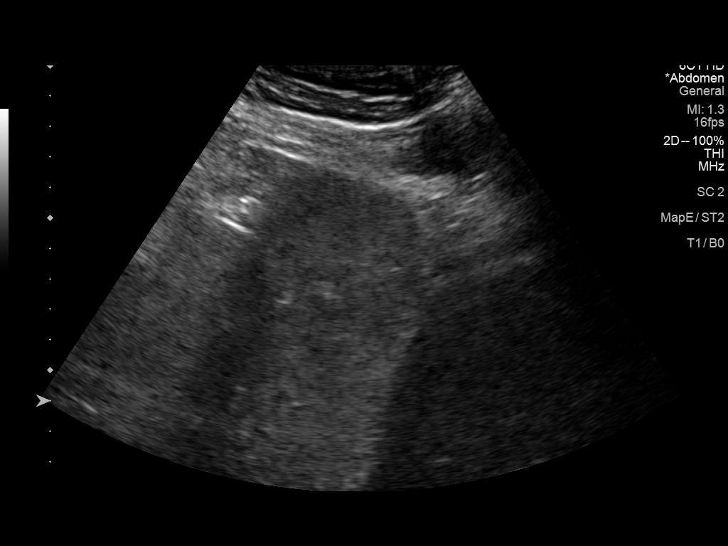
[im 32/95]
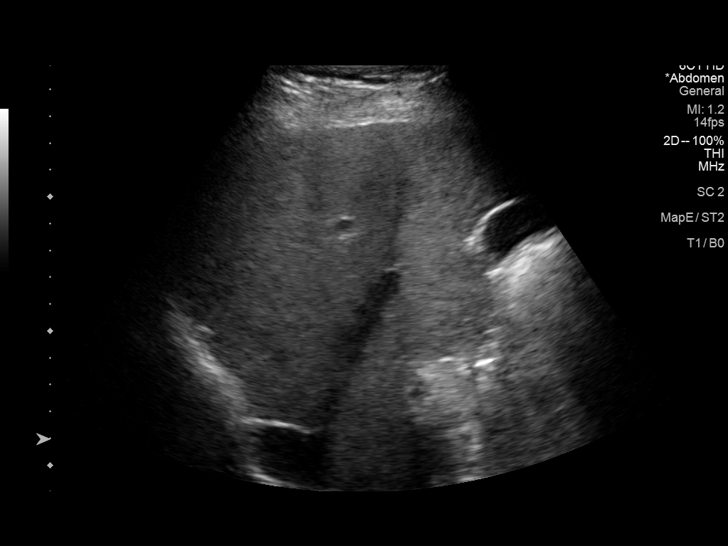
[im 36/95]
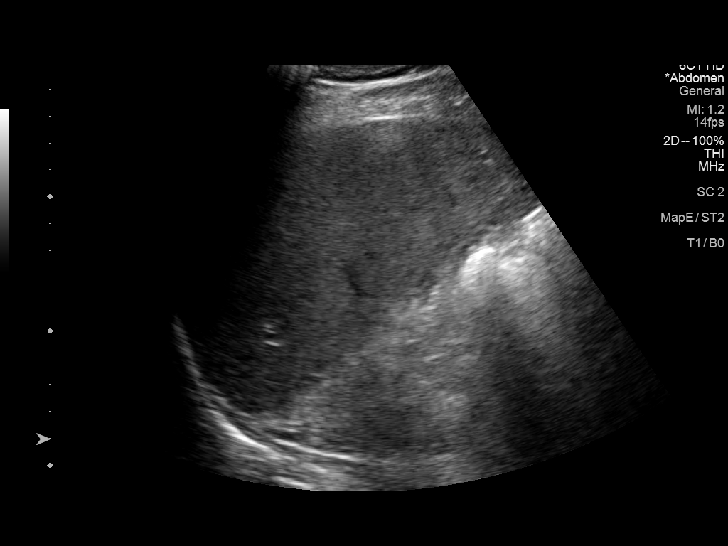
[im 44/95]
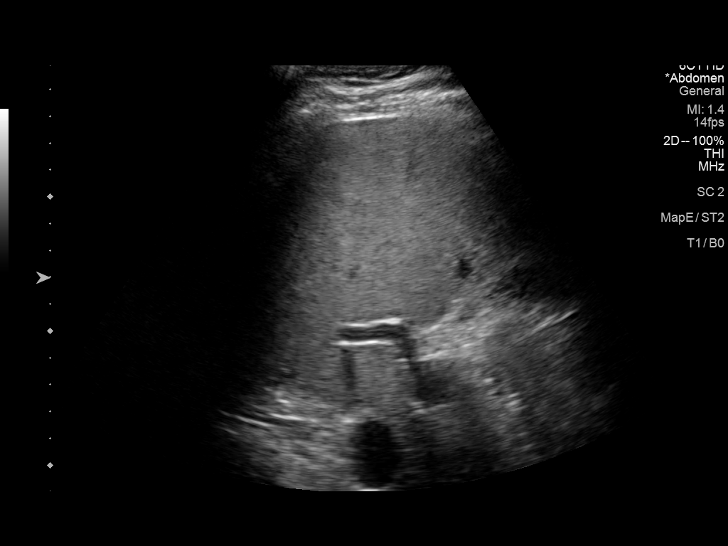
[im 51/95]
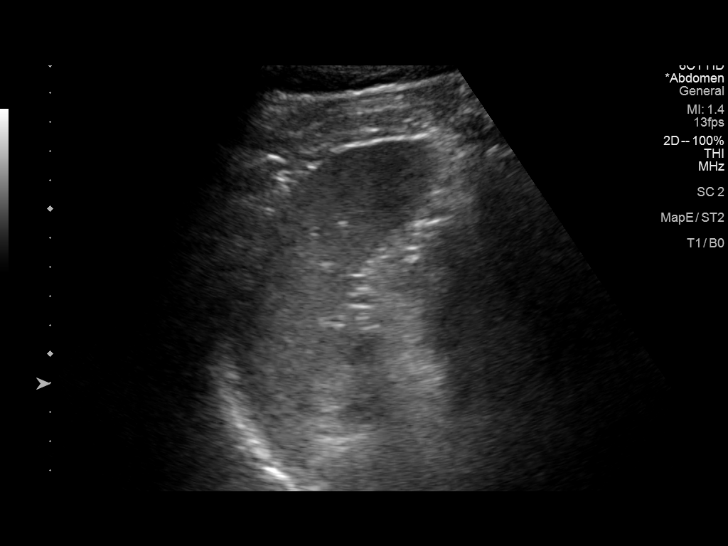
[im 59/95]
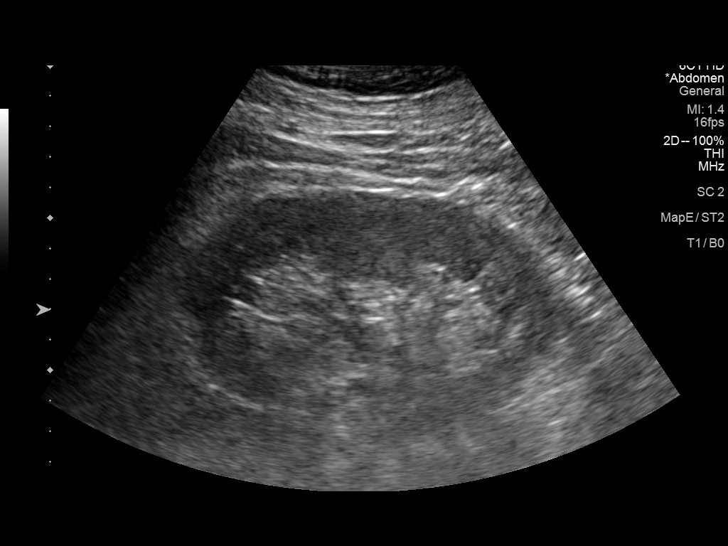
[im 63/95]
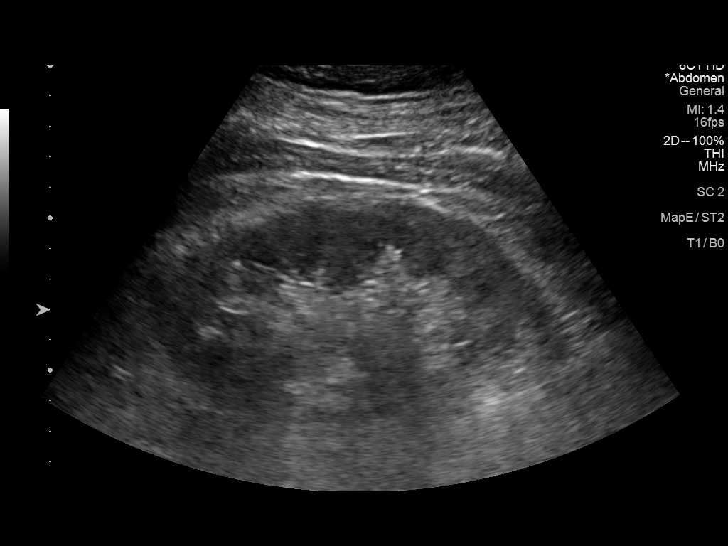
[im 71/95]
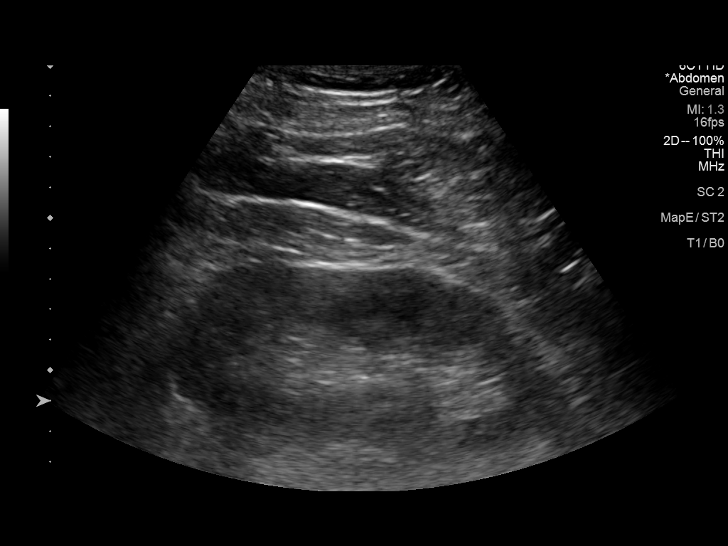
[im 79/95]
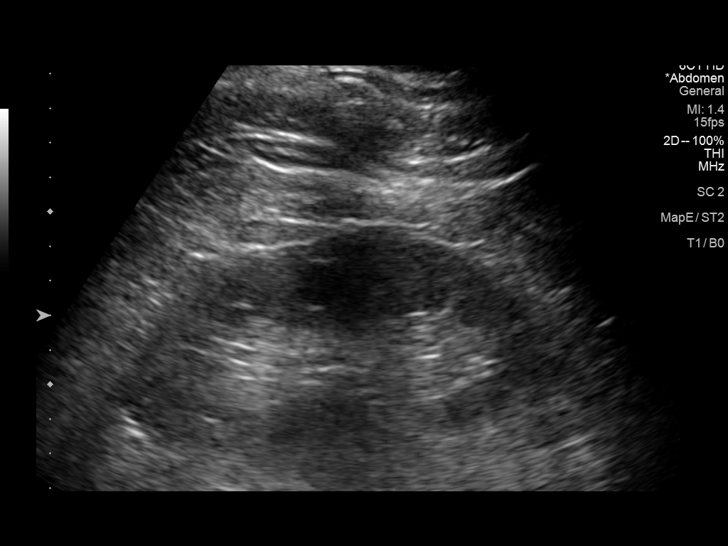
[im 87/95]
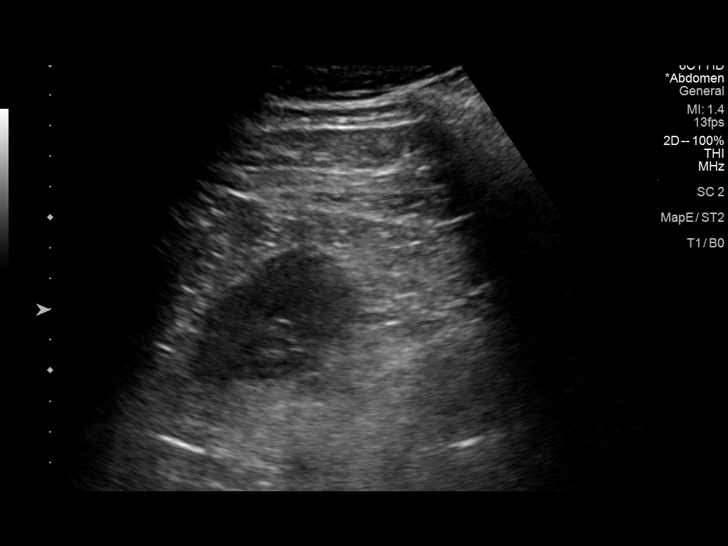
[im 95/95]
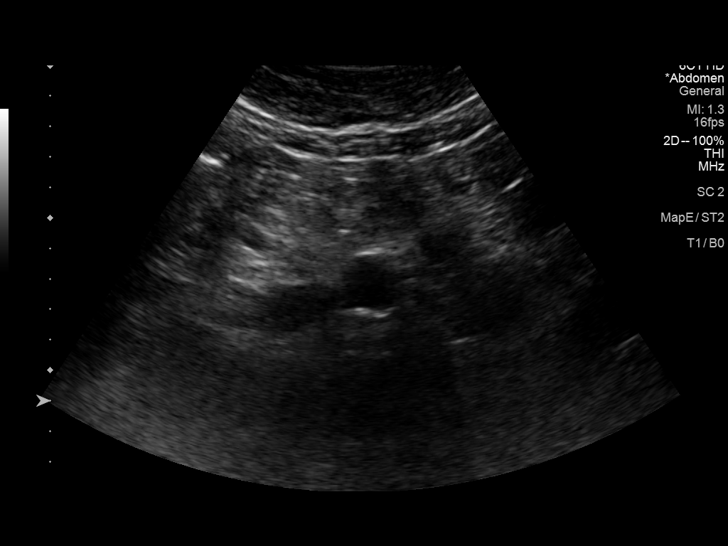

[14 of 25 positions shown; findings below may reference images not displayed]

FINDINGS: Gallbladder: No gallstones or wall thickening visualized. No
sonographic Murphy sign noted by sonographer.

Common bile duct: Diameter: 3.5 mm

Liver: No focal lesion identified. Within normal limits in
parenchymal echogenicity. Portal vein is patent on color Doppler
imaging with normal direction of blood flow towards the liver.

IVC: No abnormality visualized.

Pancreas: Overlying bowel gas limits evaluation.

Spleen: Size and appearance within normal limits.

Right Kidney: Length: 12.4 cm. Echogenicity within normal limits. No
mass or hydronephrosis visualized.

Left Kidney: Length: 13.5 cm. Echogenicity within normal limits. No
hydronephrosis visualized. 1.6 cm interpole simple cyst.

Abdominal aorta: No aneurysm visualized.

Other findings: None.
IMPRESSION: 1.6 cm left renal cyst.

Otherwise unremarkable ultrasound.

## 2022-06-14 ENCOUNTER — Ambulatory Visit (INDEPENDENT_AMBULATORY_CARE_PROVIDER_SITE_OTHER): Payer: Medicare Other

## 2022-06-14 DIAGNOSIS — Z7901 Long term (current) use of anticoagulants: Secondary | ICD-10-CM | POA: Diagnosis not present

## 2022-06-14 LAB — POCT INR: INR: 2.9 (ref 2.0–3.0)

## 2022-06-14 NOTE — Patient Instructions (Addendum)
Pre visit review using our clinic review tool, if applicable. No additional management support is needed unless otherwise documented below in the visit note.  Continue to take 2 1/2 tablets daily except take 2 tablets on Mon, Wed, and Friday. Recheck in 6 weeks.

## 2022-06-14 NOTE — Progress Notes (Signed)
Continue to take 2 1/2 tablets daily except take 2 tablets on Mon, Wed, and Friday. Recheck in 6 weeks.

## 2022-06-16 ENCOUNTER — Ambulatory Visit (INDEPENDENT_AMBULATORY_CARE_PROVIDER_SITE_OTHER): Payer: Medicare Other | Admitting: Family Medicine

## 2022-06-16 ENCOUNTER — Encounter: Payer: Self-pay | Admitting: Family Medicine

## 2022-06-16 VITALS — BP 120/70 | HR 100 | Temp 99.0°F | Resp 16 | Ht 73.0 in | Wt 232.5 lb

## 2022-06-16 DIAGNOSIS — R0981 Nasal congestion: Secondary | ICD-10-CM

## 2022-06-16 DIAGNOSIS — J069 Acute upper respiratory infection, unspecified: Secondary | ICD-10-CM | POA: Diagnosis not present

## 2022-06-16 LAB — POC INFLUENZA A&B (BINAX/QUICKVUE)
Influenza A, POC: NEGATIVE
Influenza B, POC: NEGATIVE

## 2022-06-16 LAB — POC COVID19 BINAXNOW: SARS Coronavirus 2 Ag: NEGATIVE

## 2022-06-16 MED ORDER — FLUTICASONE PROPIONATE 50 MCG/ACT NA SUSP: 1.0000 | Freq: Two times a day (BID) | NASAL | 0 refills | Status: DC

## 2022-06-16 NOTE — Patient Instructions (Addendum)
A few things to remember from today's visit:  Nasal congestion - Plan: POC COVID-19, POC Influenza A&B(BINAX/QUICKVUE)  URI, acute  Viral infections are self-limited and we treat each symptom depending of severity.  Over the counter medications as decongestants and cold medications usually help, they need to be taken with caution if there is a history of high blood pressure or palpitations. Tylenol and/or Ibuprofen also helps with most symptoms (headache, muscle aching, fever,etc) Plenty of fluids. Honey helps with cough. Steam inhalations helps with runny nose, nasal congestion, and may prevent sinus infections. Cough and nasal congestion could last a few days and sometimes weeks. Please follow in not any better in 1-2 weeks or if symptoms get worse.  Please be sure medication list is accurate. If a new problem present, please set up appointment sooner than planned today.

## 2022-06-16 NOTE — Progress Notes (Unsigned)
ACUTE VISIT Chief Complaint  Patient presents with   URI    Congestion, cough - non productive, sneezing & having blood in the morning, having a hard time sleeping.   HPI: Samuel [name] is a 72 y.o. male, who is here today complaining of *** HPI cough, nasal congestion, sneezing, bloody mucus, and difficulty sleeping due to symptoms. He reports being sick for four days. He denies having a fever, with a temperature of 99 degrees Fahrenheit upon arrival. He experiences chills, body aches, fatigue, and a sore throat. The patient is unaware of any recent sick contacts but mentions spending time with family, including a two-year-old in daycare, [place].  The patient denies wheezing or difficulty breathing but notes feeling exhausted after carrying two logs for firewood. He denies nausea, vomiting, or diarrhea and reports normal urination. He is not currently taking any medications for his symptoms. Samuel [name] has a history of an artificial aortic valve and has tested negative for COVID-19 at home.  He also reports difficulty sleeping due to two ruptured discs causing discomfort, in addition to his current symptoms. Review of Systems See other pertinent positives and negatives in HPI.  Current Outpatient Medications on File Prior to Visit  Medication Sig Dispense Refill   allopurinol (ZYLOPRIM) 300 MG tablet Take 1 tablet (300 mg total) by mouth daily. 90 tablet 0   amphetamine-dextroamphetamine (ADDERALL) 10 MG tablet Take 1 tablet (10 mg total) by mouth 2 (two) times daily. 60 tablet 0   Cholecalciferol 50 MCG (2000 UT) TBDP Take 2 tablets by mouth daily. 30 tablet    levothyroxine (SYNTHROID) 137 MCG tablet TAKE 1 TABLET BY MOUTH EVERY DAY BEFORE BREAKFAST 90 tablet 0   oxyCODONE (OXY IR/ROXICODONE) 5 MG immediate release tablet oxycodone 5 mg tablet  Take 1 tablet twice a day by oral route as needed.     sildenafil (VIAGRA) 100 MG tablet Take 0.5-1 tablets  (50-100 mg total) by mouth daily as needed for erectile dysfunction. 30 tablet 5   terbinafine (LAMISIL) 250 MG tablet Take 1 tablet (250 mg total) by mouth daily. 90 tablet 0   warfarin (COUMADIN) 5 MG tablet TAKE 2 1/2 TABLETS DAILY EXCEPT TAKE 2 TABLETS ON MONDAYS, WEDNESDAYS, AND FRIDAYS. OR TAKE AS DIRECTED BY ANTICOAGULATION CLINIC 210 tablet 1   No current facility-administered medications on file prior to visit.    Past Medical History:  Diagnosis Date   Aortic aneurysm ([place])    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   Dyslipidemia    Gout    Hemorrhoids    HEMORRHOIDS-INTERNAL 12/20/2009   History of diverticulitis 03/2018   HYPERLIPIDEMIA-MIXED 04/01/2009   HYPOTHYROIDISM 08/26/2008   INSOMNIA, TRANSIENT 04/27/2009   Kidney stones    KNEE PAIN 11/02/2008   LATERAL EPICONDYLITIS 06/02/2009   Migraines    ONYCHOMYCOSIS 11/02/2008   PERSONAL HX COLONIC POLYPS 12/20/2009   Sudden visual loss 12/25/2008   TRANSIENT ISCHEMIC ATTACK 01/25/2009   Allergies  Allergen Reactions   Shellfish Allergy Anaphylaxis   Penicillins     REACTION: childhood, reaction unknown    Social History   Socioeconomic History   Marital status: Married    Spouse name: Not on file   Number of children: Not on file   Years of education: Not on file   Highest education level: Not on file  Occupational History   Not on file  Tobacco Use   Smoking status: Former   Smokeless tobacco: Never  Vaping  Use   Vaping Use: Never used  Substance and Sexual Activity   Alcohol use: Yes    Alcohol/week: 14.0 standard drinks of alcohol    Types: 14 Glasses of wine per week    Comment: couple glasses of wine each day   Drug use: No   Sexual activity: Not on file  Other Topics Concern   Not on file  Social History Narrative   Not on file   Social Determinants of Health   Financial Resource Strain: Low Risk  (12/28/2020)   Overall Financial Resource Strain (CARDIA)    Difficulty of Paying Living Expenses: Not  hard at all  Food Insecurity: No Food Insecurity (03/25/2021)   Hunger Vital Sign    Worried About Running Out of Food in the Last Year: Never true    Ran Out of Food in the Last Year: Never true  Transportation Needs: No Transportation Needs (03/25/2021)   PRAPARE - Hydrologist (Medical): No    Lack of Transportation (Non-Medical): No  Physical Activity: Insufficiently Active (03/25/2021)   Exercise Vital Sign    Days of Exercise per Week: 3 days    Minutes of Exercise per Session: 40 min  Stress: No Stress Concern Present (03/25/2021)   [place]    Feeling of Stress : Only a little  Social Connections: Moderately Integrated (03/25/2021)   Social Connection and Isolation Panel [NHANES]    Frequency of Communication with Friends and Family: Three times a week    Frequency of Social Gatherings with Friends and Family: Three times a week    Attends Religious Services: Never    Active Member of Clubs or Organizations: Yes    Attends Archivist Meetings: More than 4 times per year    Marital Status: Married    Vitals:   06/16/22 1453  BP: 120/70  Pulse: 100  Resp: 16  Temp: 99 F (37.2 C)  SpO2: 97%   Body mass index is 30.67 kg/m.  Physical Exam Vitals and nursing note reviewed.  Constitutional:      General: He is not in acute distress.    Appearance: He is well-developed. He is not ill-appearing.  HENT:     Head: Normocephalic and atraumatic.     Right Ear: Tympanic membrane, ear canal and external ear normal.     Left Ear: Tympanic membrane, ear canal and external ear normal.     Nose: Congestion and rhinorrhea present.  Eyes:     Conjunctiva/sclera: Conjunctivae normal.  Cardiovascular:     Rate and Rhythm: Normal rate and regular rhythm.     Heart sounds: No murmur heard. Pulmonary:     Effort: Pulmonary effort is normal. No respiratory distress.      Breath sounds: Normal breath sounds. No stridor.  Lymphadenopathy:     Head:     Right side of head: No submandibular adenopathy.     Left side of head: No submandibular adenopathy.     Cervical: No cervical adenopathy.  Skin:    General: Skin is warm.     Findings: No erythema or rash.  Neurological:     Mental Status: He is alert and oriented to person, place, and time.  Psychiatric:        Speech: Speech normal.     Comments: Well groomed, good eye contact.   ASSESSMENT AND PLAN:   Samuel [name] was seen today for uri.  Diagnoses and  all orders for this visit:  Nasal congestion -     POC COVID-19 -     POC Influenza A&B(BINAX/QUICKVUE)  URI, acute  Other orders -     fluticasone (FLONASE) 50 MCG/ACT nasal spray; Place 1 spray into both nostrils 2 (two) times daily.    Orders Placed This Encounter  Procedures   POC COVID-19   POC Influenza A&B(BINAX/QUICKVUE)    Georgiann Neider G. [place], MD  [facility]. [place] office.

## 2022-06-19 ENCOUNTER — Telehealth: Payer: Self-pay

## 2022-06-19 NOTE — Telephone Encounter (Signed)
--  Caller states he had episodes of elevated heart rate over the weekend. It was worse on Saturday. Today he is not having elevated heart rate. He was diagnosed with RSV on Friday. . 06/19/2022 9:22:52 AM SEE PCP WITHIN 3 DAYS Ronnald Ramp, RN, [place]  Comments User: Leverne Humbles, RN Date/Time Eilene Ghazi Time): 06/19/2022 9:19:59 AM HR was upto 130, currently 32.  Pt has appt with PCP on 06/21/22

## 2022-06-21 ENCOUNTER — Ambulatory Visit (INDEPENDENT_AMBULATORY_CARE_PROVIDER_SITE_OTHER): Payer: Medicare Other | Admitting: Family Medicine

## 2022-06-21 ENCOUNTER — Encounter: Payer: Self-pay | Admitting: Family Medicine

## 2022-06-21 VITALS — BP 104/60 | HR 88 | Temp 97.9°F | Ht 73.0 in | Wt 232.1 lb

## 2022-06-21 DIAGNOSIS — R000 Tachycardia, unspecified: Secondary | ICD-10-CM | POA: Diagnosis not present

## 2022-06-21 DIAGNOSIS — K429 Umbilical hernia without obstruction or gangrene: Secondary | ICD-10-CM | POA: Diagnosis not present

## 2022-06-21 DIAGNOSIS — E039 Hypothyroidism, unspecified: Secondary | ICD-10-CM | POA: Diagnosis not present

## 2022-06-21 LAB — BASIC METABOLIC PANEL
BUN: 11 mg/dL (ref 6–23)
CO2: 33 mEq/L — ABNORMAL HIGH (ref 19–32)
Calcium: 9.4 mg/dL (ref 8.4–10.5)
Chloride: 101 mEq/L (ref 96–112)
Creatinine, Ser: 0.92 mg/dL (ref 0.40–1.50)
GFR: 83.49 mL/min (ref >60.00)
Glucose, Bld: 100 mg/dL — ABNORMAL HIGH (ref 70–99)
Potassium: 4.6 mEq/L (ref 3.5–5.1)
Sodium: 139 mEq/L (ref 135–145)

## 2022-06-21 LAB — MAGNESIUM: Magnesium: 2.3 mg/dL (ref 1.5–2.5)

## 2022-06-21 LAB — CBC WITH DIFFERENTIAL/PLATELET
Basophils Absolute: 0 10*3/uL (ref 0.0–0.1)
Basophils Relative: 0.7 % (ref 0.0–3.0)
Eosinophils Absolute: 0.1 10*3/uL (ref 0.0–0.7)
Eosinophils Relative: 1.3 % (ref 0.0–5.0)
HCT: 44 % (ref 39.0–52.0)
Hemoglobin: 15.3 g/dL (ref 13.0–17.0)
Lymphocytes Relative: 21.3 % (ref 12.0–46.0)
Lymphs Abs: 1.5 10*3/uL (ref 0.7–4.0)
MCHC: 34.9 g/dL (ref 30.0–36.0)
MCV: 91.3 fl (ref 78.0–100.0)
Monocytes Absolute: 0.9 10*3/uL (ref 0.1–1.0)
Monocytes Relative: 12.8 % — ABNORMAL HIGH (ref 3.0–12.0)
Neutro Abs: 4.4 10*3/uL (ref 1.4–7.7)
Neutrophils Relative %: 63.9 % (ref 43.0–77.0)
Platelets: 286 10*3/uL (ref 150.0–400.0)
RBC: 4.82 Mil/uL (ref 4.22–5.81)
RDW: 13.3 % (ref 11.5–15.5)
WBC: 6.9 10*3/uL (ref 4.0–10.5)

## 2022-06-21 LAB — TSH: TSH: 1.23 u[IU]/mL (ref 0.35–5.50)

## 2022-06-21 MED ORDER — METOPROLOL TARTRATE 25 MG PO TABS: ORAL_TABLET | ORAL | 1 refills | Status: DC

## 2022-06-21 MED ORDER — BENZONATATE 100 MG PO CAPS: 100.0000 mg | ORAL_CAPSULE | Freq: Three times a day (TID) | ORAL | 0 refills | Status: DC | PRN

## 2022-06-21 NOTE — Progress Notes (Signed)
Established Patient Office Visit  Subjective   Patient ID: Samuel [name], male    DOB: [birth year]  Age: 72 y.o. MRN: [id number]  Chief Complaint  Patient presents with   Irregular Heart Beat    Patient complains of increased heart rate, x4 days    Medication Consultation    HPI   Samuel [name] is seen with transient elevated heart rate this past Saturday.  He relates respiratory illness last week.  He first felt ill on Tuesday and was seen Friday.  COVID and influenza testing negative.  He denies any recent decongestants.  He states he was well-hydrated.  Generally drinks 2 cups of coffee in the morning.  Does take low-dose Adderall for ADD but held dosage Saturday and Sunday.  Has never had tachycardia previously with Adderall.  No known history of A-fib or flutter.  Has history of bicuspid aortic valve and prior aortic valve replacement and is on chronic Coumadin.  Denies any recent chest pains.  He had only low-grade fever last week but none over the weekend.  He does have hypothyroidism and is on replacement with most recent TSH 0.57.  He has small umbilical hernia.  He inquires about possible referral but is not interested quite yet.  No recent strangulation symptoms.  This has grown slightly in size within the past year he thinks  Last ECHO 9/22:   Patient Name: Samuel [name] Date of Exam: 03/11/2021 Medical Rec #: [id number] Height: 73.0 in Accession #: [id number] Weight: 239.7 lb Date of Birth: Aug 23, 1950 BSA: 2.324 m Patient Age: 7 years BP: 142/80 mmHg Patient Gender: M HR: 66 bpm. Exam Location: [place] Procedure: 2D Echo, Cardiac Doppler and Color Doppler Indications: I35.9* Aortic valve disease, unspecified History: Patient has prior history of Echocardiogram examinations, most recent 05/21/2019. Risk Factors:Hypertension and Dyslipidemia. Bicuspid aortic valve. Aortic valve replacement. Mechanical aortic valve. Low back pain. Hypothyroidism. Sonographer: Diamond Nickel  RCS Referring Phys: [id number] PAULA V ROSS IMPRESSIONS Left ventricular ejection fraction, by estimation, is 60 to 65%. The left ventricle has normal function. The left ventricle has no regional wall motion abnormalities. There is moderate asymmetric left ventricular hypertrophy of the basal-septal segment. Left ventricular diastolic parameters were normal. 1. Right ventricular systolic function is normal. The right ventricular size is normal. Tricuspid regurgitation signal is inadequate for assessing PA pressure. 2. 3. Left atrial size was mildly dilated. The mitral valve is normal in structure. Trivial mitral valve regurgitation. No evidence of mitral stenosis. 4. There is a mechanical valve present in the aortic position Aortic valve regurgitation is trivial. Echo findings are consistent with normal structure and function of the Past Medical History:  Diagnosis Date   Aortic aneurysm ([place])    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   Dyslipidemia    Gout    Hemorrhoids    HEMORRHOIDS-INTERNAL 12/20/2009   History of diverticulitis 03/2018   HYPERLIPIDEMIA-MIXED 04/01/2009   HYPOTHYROIDISM 08/26/2008   INSOMNIA, TRANSIENT 04/27/2009   Kidney stones    KNEE PAIN 11/02/2008   LATERAL EPICONDYLITIS 06/02/2009   Migraines    ONYCHOMYCOSIS 11/02/2008   PERSONAL HX COLONIC POLYPS 12/20/2009   Sudden visual loss 12/25/2008   TRANSIENT ISCHEMIC ATTACK 01/25/2009   Past Surgical History:  Procedure Laterality Date   CYSTECTOMY     from abdominal wall   MYRINGOTOMY WITH TUBE PLACEMENT Left    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   TONSILLECTOMY      reports that he has  quit smoking. He has never used smokeless tobacco. He reports current alcohol use of about 14.0 standard drinks of alcohol per week. He reports that he does not use drugs. family history includes Diabetes in his mother; Heart disease in his father. Allergies  Allergen Reactions   Shellfish Allergy Anaphylaxis    Penicillins     REACTION: childhood, reaction unknown    Review of Systems  Constitutional:  Negative for chills, fever and weight loss.  HENT:  Positive for congestion.   Respiratory:  Positive for cough.   Cardiovascular:  Negative for chest pain, leg swelling and PND.  Neurological:  Negative for dizziness, focal weakness, loss of consciousness and headaches.      Objective:     BP 104/60 (BP Location: Left Arm, Patient Position: Sitting, Cuff Size: Large)   Pulse 88   Temp 97.9 F (36.6 C) (Oral)   Ht 6\' 1"  (1.854 m)   Wt 232 lb 1.6 oz (105.3 kg)   SpO2 98%   BMI 30.62 kg/m  BP Readings from Last 3 Encounters:  06/21/22 104/60  06/16/22 120/70  01/25/22 (!) 146/80   Wt Readings from Last 3 Encounters:  06/21/22 232 lb 1.6 oz (105.3 kg)  06/16/22 232 lb 8 oz (105.5 kg)  01/25/22 232 lb 1.6 oz (105.3 kg)      Physical Exam Vitals reviewed.  Constitutional:      Appearance: Normal appearance.  Cardiovascular:     Rate and Rhythm: Normal rate and regular rhythm.  Pulmonary:     Effort: Pulmonary effort is normal.     Breath sounds: Normal breath sounds. No wheezing or rales.  Abdominal:     Comments: Small umbilical hernia which is soft and nontender  Neurological:     Mental Status: He is alert.      No results found for any visits on 06/21/22.    The 10-year ASCVD risk score (Arnett DK, et al., 2019) is: 13.6%    Assessment & Plan:   #1 transient tachyarrhythmia in the setting of recent presumed viral URI.  Possibly related to recent viral illness but increase in heart rate of 130 would be somewhat unusual in the absence of any fever or other obvious precipitant such as decongestant use.  We did discuss other potential etiologies such as transient A-fib/flutter although he denies any irregularity of pulse.  We also explained the fact that Adderall alone would be unlikely to increase his heart rate to this degree when he normally has no increased heart  rate with this medication.  He also did not use any beta agonist such as albuterol  -Check EKG.  This shows what appears to be Atrial fibrillation, but rate controlled.   -Hold Adderall and limit caffeine for any recurrent symptoms -Recommend consideration for cardiac event monitor and close follow-up with cardiology if he has any recurrent episodes -Would also recheck TSH and may need to back off his levothyroxine dose as most recent TSH was in the low normal range  #2 small umbilical hernia.  Currently asymptomatic.  He does express wish eventually to get this fixed but not currently.  With EKG showing probable atrial fibrillation will set up cardiology follow up.  Continue with coumadin therapy.  Rate stable.   Wrote for metoprolol 25 mg to take one every 12 hours if rate goes back up.  Setting up cardiology re- assessment.   No follow-ups on file.    Carolann Littler, MD

## 2022-06-22 NOTE — Progress Notes (Signed)
Please make sure pt has appt with me for reported new Afib    I was not able to retrieve EKG done by Dr Elease Hashimoto

## 2022-06-26 ENCOUNTER — Telehealth: Payer: Medicare Other

## 2022-06-26 ENCOUNTER — Encounter: Payer: Self-pay | Admitting: Family Medicine

## 2022-06-27 ENCOUNTER — Telehealth: Payer: Self-pay

## 2022-06-27 NOTE — Telephone Encounter (Signed)
-----  Message from Fay Records, MD sent at 06/22/2022 10:57 PM EST -----    ----- Message ----- From: Eulas Post, MD Sent: 06/21/2022   7:29 PM EST To: Fay Records, MD

## 2022-06-27 NOTE — Telephone Encounter (Signed)
Pt needs appt with Dr Harrington Challenger for new Afib.. LM for him to call and make appt..she can see him this Friday if he is available.

## 2022-06-28 ENCOUNTER — Other Ambulatory Visit: Payer: Self-pay | Admitting: Family Medicine

## 2022-06-29 NOTE — Progress Notes (Signed)
Office Visit    Patient Name: Samuel [name] Date of Encounter: 06/30/2022  Primary Care Provider:  Kristian Covey, MD Primary Cardiologist:  Dietrich Pates, MD Primary Electrophysiologist: None  Chief Complaint    TAVIAN CALLANDER is a 72 y.o. male with PMH of bicuspid aortic valve s/p Saint Jude mechanical AVR with ascending aortic root replacement 2010, TIA, hypothyroidism, HLD who presents today for new onset atrial fibrillation.  Past Medical History    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   Dyslipidemia    Gout    Hemorrhoids    HEMORRHOIDS-INTERNAL 12/20/2009   History of diverticulitis 03/2018   HYPERLIPIDEMIA-MIXED 04/01/2009   HYPOTHYROIDISM 08/26/2008   INSOMNIA, TRANSIENT 04/27/2009   Kidney stones    KNEE PAIN 11/02/2008   LATERAL EPICONDYLITIS 06/02/2009   Migraines    ONYCHOMYCOSIS 11/02/2008   PERSONAL HX COLONIC POLYPS 12/20/2009   Sudden visual loss 12/25/2008   TRANSIENT ISCHEMIC ATTACK 01/25/2009   Past Surgical History:  Procedure Laterality Date   CYSTECTOMY     from abdominal wall   MYRINGOTOMY WITH TUBE PLACEMENT Left    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   TONSILLECTOMY      Allergies  Allergies  Allergen Reactions   Shellfish Allergy Anaphylaxis   Penicillins     REACTION: childhood, reaction unknown    History of Present Illness    Samuel [name]  is a 72 year old male with the above mention past medical history who presents today for complaint of new onset atrial fibrillation.  He was initially seen in 2010 by Dr. Tenny Craw after suffering a TIA and found to have progression of aortic stenosis with mean gradient of 59 mmHg.  Carotid ultrasounds were completed that were negative. He underwent mechanical AVR replacement with ascending aortic root replacement.  He was started on Coumadin and has done well with no palpitations or chest pain.  He was last seen by Dr. Tenny Craw in  office on 07/26/2020.  He underwent 2D echo 03/2021 that showed EF of 60-65%, no RWMA, mild LAE dilation with trivial AV regurgitation and mean valve gradient of 7 mmHg.  He had endorsed occasional skipped heartbeats and event monitor was ordered however not completed by patient.  Mr. Hortman was seen by his PCP Dr. Caryl Never on 06/21/2022 with complaint of irregular heartbeat.  EKG was completed showing atrial fibs with controlled heart rate.  He endorsed recent viral upper respiratory infection and patient wrote for 25 mg metoprolol every 12 hours for elevated heart rate.  Mr. Decola presents today for new onset atrial fibrillation unknown.  Since last being seen in the office patient reports that he is not experiencing any dizziness or shortness of breath.  His blood pressure today is well-controlled at 112/78 and heart rate is currently 70 bpm.  EKG was completed and obtained showing atrial fibs/flutter with controlled rate of 78 bpm.  He reports that he started feeling bad following New Year's Day and went to his PCP with complaints of upper respiratory infection.  He noticed following his appointment on that Saturday that his heart rate was elevated to the 130s.  He remained asymptomatic and held his Adderall and caffeine consumption.  He went to his PCP a few days ago and was told to continue to hold his Adderall and also was provided as needed metoprolol.  He reports that he  has not started the metoprolol and continues to hold Adderall currently.  Patient denies chest pain, palpitations, dyspnea, PND, orthopnea, nausea, vomiting, dizziness, syncope, edema, weight gain, or early satiety.  - Home Medications    Current Outpatient Medications  Medication Sig Dispense Refill   allopurinol (ZYLOPRIM) 300 MG tablet Take 1 tablet (300 mg total) by mouth daily. 90 tablet 0   amphetamine-dextroamphetamine (ADDERALL) 10 MG tablet Take 1 tablet (10 mg total) by mouth 2 (two) times daily. 60 tablet 0   benzonatate  (TESSALON PERLES) 100 MG capsule Take 1 capsule (100 mg total) by mouth 3 (three) times daily as needed for cough. 30 capsule 0   Cholecalciferol 50 MCG (2000 UT) TBDP Take 2 tablets by mouth daily. 30 tablet    fluticasone (FLONASE) 50 MCG/ACT nasal spray Place 1 spray into both nostrils 2 (two) times daily. 16 g 0   levothyroxine (SYNTHROID) 137 MCG tablet TAKE 1 TABLET BY MOUTH EVERY DAY BEFORE BREAKFAST 90 tablet 0   metoprolol succinate (TOPROL XL) 25 MG 24 hr tablet Take 1 tablet (25 mg total) by mouth daily. 30 tablet 2   metoprolol tartrate (LOPRESSOR) 25 MG tablet TAKE ONE TABLET BY MOUTH EVERY 12 HOURS AS NEEDED FOR TACHYCARDIA 180 tablet 1   oxyCODONE (OXY IR/ROXICODONE) 5 MG immediate release tablet oxycodone 5 mg tablet  Take 1 tablet twice a day by oral route as needed.     sildenafil (VIAGRA) 100 MG tablet Take 0.5-1 tablets (50-100 mg total) by mouth daily as needed for erectile dysfunction. 30 tablet 5   terbinafine (LAMISIL) 250 MG tablet Take 1 tablet (250 mg total) by mouth daily. 90 tablet 0   warfarin (COUMADIN) 5 MG tablet TAKE 2 1/2 TABLETS DAILY EXCEPT TAKE 2 TABLETS ON MONDAYS, WEDNESDAYS, AND FRIDAYS. OR TAKE AS DIRECTED BY ANTICOAGULATION CLINIC 210 tablet 1   No current facility-administered medications for this visit.     Review of Systems  Please see the history of present illness.     All other systems reviewed and are otherwise negative except as noted above.  Physical Exam    Wt Readings from Last 3 Encounters:  06/30/22 231 lb 6.4 oz (105 kg)  06/21/22 232 lb 1.6 oz (105.3 kg)  06/16/22 232 lb 8 oz (105.5 kg)   VS: Vitals:   06/30/22 1130  BP: 112/78  Pulse: 70  SpO2: 97%  ,Body mass index is 30.53 kg/m.  Constitutional:      Appearance: Healthy appearance. Not in distress.  Neck:     Vascular: JVD normal.  Pulmonary:     Effort: Pulmonary effort is normal.     Breath sounds: No wheezing. No rales. Diminished in the bases Cardiovascular:      Normal rate. Regular rhythm. Normal S1. Normal S2.      Murmurs: There is no murmur.  Edema:    Peripheral edema absent.  Abdominal:     Palpations: Abdomen is soft non tender. There is no hepatomegaly.  Skin:    General: Skin is warm and dry.  Neurological:     General: No focal deficit present.     Mental Status: Alert and oriented to person, place and time.     Cranial Nerves: Cranial nerves are intact.  EKG/LABS/Other Studies Reviewed    ECG personally reviewed by me today -atrial fibs/flutter with rate of 70 bpm no acute changes consistent with previous EKG.  Risk Assessment/Calculations:    CHA2DS2-VASc Score = 2   This  indicates a 2.2% annual risk of stroke. The patient's score is based upon: CHF History: 0 HTN History: 0 Diabetes History: 0 Stroke History: 0 Vascular Disease History: 1 Age Score: 1 Gender Score: 0           Lab Results  Component Value Date   WBC 6.9 06/21/2022   HGB 15.3 06/21/2022   HCT 44.0 06/21/2022   MCV 91.3 06/21/2022   PLT 286.0 06/21/2022   Lab Results  Component Value Date   CREATININE 0.92 06/21/2022   BUN 11 06/21/2022   NA 139 06/21/2022   K 4.6 06/21/2022   CL 101 06/21/2022   CO2 33 (H) 06/21/2022   Lab Results  Component Value Date   ALT 40 10/01/2020   AST 34 10/01/2020   ALKPHOS 62 10/01/2020   BILITOT 1.1 10/01/2020   Lab Results  Component Value Date   CHOL 171 07/26/2020   HDL 44 07/26/2020   LDLCALC 88 07/26/2020   LDLDIRECT 137.9 06/21/2012   TRIG 232 (H) 07/26/2020   CHOLHDL 3.9 07/26/2020    Lab Results  Component Value Date   HGBA1C  02/23/2009    5.8 (NOTE) The ADA recommends the following therapeutic goal for glycemic control related to Hgb A1c measurement: Goal of therapy: <6.5 Hgb A1c  Reference: American Diabetes Association: Clinical Practice Recommendations 2010, Diabetes Care, 2010, 33: (Suppl  1).    Assessment & Plan    1.  New onset atrial fibrillation: -Patient recently  presented to PCP with complaint of irregular heartbeat and found to have AF with controlled rate by EKG -Today patient is rate controlled with atrial fibs/atrial flutter -He was recently prescribed as needed metoprolol but has yet to begin medication. -He is currently asymptomatic and denies any shortness of breath. -We will have him wear 14-day ZIO monitor to evaluate AF burden -We will start Toprol-XL 25 mg today -CHA2DS2-VASc Score = 2 [CHF History: 0, HTN History: 0, Diabetes History: 0, Stroke History: 0, Vascular Disease History: 1, Age Score: 1, Gender Score: 0].  Therefore, the patient's annual risk of stroke is 2.2 %.     2.  History of aortic stenosis with AVR repair: -s/p Saint Jude mechanical AVR repair in 2010 -SBE prophylaxis discussed -Continue Coumadin as managed by Coumadin clinic  3.  Mixed hyperlipidemia: -Patient's last LDL cholesterol was 88 slightly above goal of less than 70.  4.  Hypertrophic cardiomyopathy: -Patient's last 2D echo was completed 03/11/2021 showing moderate asymmetric left ventricular hypertrophy of the basal septal segment. -Blood pressures currently well-controlled and repeat 2D echo will be discussed at follow-up.  5.  Hypothyroidism: -Continue current treatment plan per PCP  Disposition: Follow-up with Dorris Carnes, MD or APP in 1 months   Medication Adjustments/Labs and Tests Ordered: Current medicines are reviewed at length with the patient today.  Concerns regarding medicines are outlined above.   Signed, Mable Fill, Marissa Nestle, NP 06/30/2022, 1:28 PM [facility]  Note:  This document was prepared using Dragon voice recognition software and may include unintentional dictation errors.

## 2022-06-30 ENCOUNTER — Encounter: Payer: Self-pay | Admitting: Nurse Practitioner

## 2022-06-30 ENCOUNTER — Ambulatory Visit: Payer: No Typology Code available for payment source | Attending: Nurse Practitioner

## 2022-06-30 ENCOUNTER — Ambulatory Visit: Payer: Medicare Other | Attending: Nurse Practitioner | Admitting: Nurse Practitioner

## 2022-06-30 VITALS — BP 112/78 | HR 70 | Ht 73.0 in | Wt 231.4 lb

## 2022-06-30 DIAGNOSIS — E039 Hypothyroidism, unspecified: Secondary | ICD-10-CM

## 2022-06-30 DIAGNOSIS — I422 Other hypertrophic cardiomyopathy: Secondary | ICD-10-CM

## 2022-06-30 DIAGNOSIS — I4891 Unspecified atrial fibrillation: Secondary | ICD-10-CM | POA: Insufficient documentation

## 2022-06-30 DIAGNOSIS — Z952 Presence of prosthetic heart valve: Secondary | ICD-10-CM | POA: Diagnosis not present

## 2022-06-30 DIAGNOSIS — I359 Nonrheumatic aortic valve disorder, unspecified: Secondary | ICD-10-CM | POA: Diagnosis not present

## 2022-06-30 MED ORDER — METOPROLOL SUCCINATE ER 25 MG PO TB24: 25.0000 mg | ORAL_TABLET | Freq: Every day | ORAL | 2 refills | Status: DC

## 2022-06-30 NOTE — Patient Instructions (Signed)
Medication Instructions:  START Metoprolol 25mg  Take 1 tablet once a day  *If you need a refill on your cardiac medications before your next appointment, please call your pharmacy*   Lab Work: None Ordered   Testing/Procedures: [place]n [place]- Long Term Monitor Instructions  Your physician has requested you wear a ZIO patch monitor for 14 days.  This is a single patch monitor. [employer] supplies one patch monitor per enrollment. Additional stickers are not available. Please do not apply patch if you will be having a Nuclear Stress Test,  Echocardiogram, Cardiac CT, MRI, or Chest Xray during the period you would be wearing the  monitor. The patch cannot be worn during these tests. You cannot remove and re-apply the  ZIO XT patch monitor.  Your ZIO patch monitor will be mailed 3 day USPS to your address on file. It may take 3-5 days  to receive your monitor after you have been enrolled.  Once you have received your monitor, please review the enclosed instructions. Your monitor  has already been registered assigning a specific monitor serial # to you.  Billing and Patient Assistance Program Information  We have supplied [employer] with any of your insurance information on file for billing purposes. [employer] offers a sliding scale Patient Assistance Program for patients that do not have  insurance, or whose insurance does not completely cover the cost of the ZIO monitor.  You must apply for the Patient Assistance Program to qualify for this discounted rate.  To apply, please call [employer] at [phone], select option 4, select option 2, ask to apply for  Patient Assistance Program. Theodore Demark will ask your household income, and how many people  are in your household. They will quote your out-of-pocket cost based on that information.  [employer] will also be able to set up a 25-month, interest-free payment plan if needed.  Applying the monitor   Shave hair from upper left chest.  Hold abrader disc by  orange tab. Rub abrader in 40 strokes over the upper left chest as  indicated in your monitor instructions.  Clean area with 4 enclosed alcohol pads. Let dry.  Apply patch as indicated in monitor instructions. Patch will be placed under collarbone on left  side of chest with arrow pointing upward.  Rub patch adhesive wings for 2 minutes. Remove white label marked "1". Remove the white  label marked "2". Rub patch adhesive wings for 2 additional minutes.  While looking in a mirror, press and release button in center of patch. A small green light will  flash 3-4 times. This will be your only indicator that the monitor has been turned on.  Do not shower for the first 24 hours. You may shower after the first 24 hours.  Press the button if you feel a symptom. You will hear a small click. Record Date, Time and  Symptom in the Patient Logbook.  When you are ready to remove the patch, follow instructions on the last 2 pages of Patient  Logbook. Stick patch monitor onto the last page of Patient Logbook.  Place Patient Logbook in the blue and white box. Use locking tab on box and tape box closed  securely. The blue and white box has prepaid postage on it. Please place it in the mailbox as  soon as possible. Your physician should have your test results approximately 7 days after the  monitor has been mailed back to [facility].  Call [place] at [phone] if you have questions regarding  your ZIO XT patch monitor. Call them immediately if you see an orange light blinking on your  monitor.  If your monitor falls off in less than 4 days, contact our Monitor department at [phone].  If your monitor becomes loose or falls off after 4 days call [employer] at [phone] for  suggestions on securing your monitor    Follow-Up: At [facility], you and your health needs are our priority.  As part of our continuing mission to provide you with exceptional heart care, we  have created designated Provider Care Teams.  These Care Teams include your primary Cardiologist (physician) and Advanced Practice Providers (APPs -  Physician Assistants and Nurse Practitioners) who all work together to provide you with the care you need, when you need it.  We recommend signing up for the patient portal called "MyChart".  Sign up information is provided on this After Visit Summary.  MyChart is used to connect with patients for Virtual Visits (Telemedicine).  Patients are able to view lab/test results, encounter notes, upcoming appointments, etc.  Non-urgent messages can be sent to your provider as well.   To learn more about what you can do with MyChart, go to [email or web address].    Your next appointment:   4 week(s)  Provider:   Ambrose Pancoast, NP       Other Instructions

## 2022-06-30 NOTE — Progress Notes (Unsigned)
Enrolled patient for a 14 day Zio XT monitor to be mailed to patients home  Dr Ross to read 

## 2022-07-01 MED ORDER — AMPHETAMINE-DEXTROAMPHETAMINE 10 MG PO TABS: 10.0000 mg | ORAL_TABLET | Freq: Two times a day (BID) | ORAL | 0 refills | Status: DC

## 2022-07-01 NOTE — Telephone Encounter (Signed)
Refill of Adderall sent  Eulas Post MD [facility]

## 2022-07-05 DIAGNOSIS — I359 Nonrheumatic aortic valve disorder, unspecified: Secondary | ICD-10-CM

## 2022-07-05 DIAGNOSIS — I4891 Unspecified atrial fibrillation: Secondary | ICD-10-CM | POA: Diagnosis not present

## 2022-07-05 DIAGNOSIS — Z952 Presence of prosthetic heart valve: Secondary | ICD-10-CM | POA: Diagnosis not present

## 2022-07-05 DIAGNOSIS — I422 Other hypertrophic cardiomyopathy: Secondary | ICD-10-CM | POA: Diagnosis not present

## 2022-07-05 DIAGNOSIS — E039 Hypothyroidism, unspecified: Secondary | ICD-10-CM | POA: Diagnosis not present

## 2022-07-08 ENCOUNTER — Other Ambulatory Visit: Payer: Self-pay | Admitting: Family Medicine

## 2022-07-10 ENCOUNTER — Other Ambulatory Visit: Payer: Self-pay | Admitting: Family Medicine

## 2022-07-13 DIAGNOSIS — B351 Tinea unguium: Secondary | ICD-10-CM | POA: Diagnosis not present

## 2022-07-13 DIAGNOSIS — M79676 Pain in unspecified toe(s): Secondary | ICD-10-CM | POA: Diagnosis not present

## 2022-07-24 DIAGNOSIS — I422 Other hypertrophic cardiomyopathy: Secondary | ICD-10-CM | POA: Diagnosis not present

## 2022-07-24 DIAGNOSIS — I359 Nonrheumatic aortic valve disorder, unspecified: Secondary | ICD-10-CM | POA: Diagnosis not present

## 2022-07-26 ENCOUNTER — Ambulatory Visit (INDEPENDENT_AMBULATORY_CARE_PROVIDER_SITE_OTHER): Payer: Medicare Other

## 2022-07-26 DIAGNOSIS — Z7901 Long term (current) use of anticoagulants: Secondary | ICD-10-CM

## 2022-07-26 LAB — POCT INR: INR: 3.4 — AB (ref 2.0–3.0)

## 2022-07-26 NOTE — Patient Instructions (Addendum)
Pre visit review using our clinic review tool, if applicable. No additional management support is needed unless otherwise documented below in the visit note.  Hold dose today and then change weekly dose to take 2 tablets daily except take 2 1/2 tablets on Tuesday, Thursday and Saturday. Recheck in 3 weeks.

## 2022-07-26 NOTE — Progress Notes (Signed)
Hold dose today and then change weekly dose to take 2 tablets daily except take 2 1/2 tablets on Tuesday, Thursday and Saturday. Recheck in 3 weeks.

## 2022-08-01 ENCOUNTER — Encounter: Payer: Self-pay | Admitting: Family Medicine

## 2022-08-01 NOTE — Progress Notes (Unsigned)
Office Visit    Patient Name: Samuel [name] Date of Encounter: 08/01/2022  Primary Care Provider:  Eulas Post, MD Primary Cardiologist:  Dorris Carnes, MD Primary Electrophysiologist: None  Chief Complaint    Samuel [name] is a 72 y.o. male with PMH of bicuspid aortic valve s/p Saint Jude mechanical AVR with ascending aortic root replacement 2010, TIA, hypothyroidism, HLD who presents today for new onset atrial fibrillation.   Past Medical History    Past Medical History:  Diagnosis Date   Aortic aneurysm ([place])    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   Dyslipidemia    Gout    Hemorrhoids    HEMORRHOIDS-INTERNAL 12/20/2009   History of diverticulitis 03/2018   HYPERLIPIDEMIA-MIXED 04/01/2009   HYPOTHYROIDISM 08/26/2008   INSOMNIA, TRANSIENT 04/27/2009   Kidney stones    KNEE PAIN 11/02/2008   LATERAL EPICONDYLITIS 06/02/2009   Migraines    ONYCHOMYCOSIS 11/02/2008   PERSONAL HX COLONIC POLYPS 12/20/2009   Sudden visual loss 12/25/2008   TRANSIENT ISCHEMIC ATTACK 01/25/2009   Past Surgical History:  Procedure Laterality Date   CYSTECTOMY     from abdominal wall   MYRINGOTOMY WITH TUBE PLACEMENT Left    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   TONSILLECTOMY      Allergies  Allergies  Allergen Reactions   Shellfish Allergy Anaphylaxis   Penicillins     REACTION: childhood, reaction unknown    History of Present Illness    Samuel [name]  is a 72 year old male with the above mention past medical history who presents today for complaint of new onset atrial fibrillation.  He was initially seen in 2010 by Dr. Harrington Challenger after suffering a TIA and found to have progression of aortic stenosis with mean gradient of 59 mmHg.  Carotid ultrasounds were completed that were negative. He underwent mechanical AVR replacement with ascending aortic root replacement.  He was started on Coumadin and has done well with no palpitations or chest pain.  He was last seen by Dr. Harrington Challenger in  office on 07/26/2020.  He underwent 2D echo 03/2021 that showed EF of 60-65%, no RWMA, mild LAE dilation with trivial AV regurgitation and mean valve gradient of 7 mmHg.  He had endorsed occasional skipped heartbeats and event monitor was ordered however not completed by patient.  Mr. Girvin was seen by his PCP Dr. Elease Hashimoto on 06/21/2022 with complaint of irregular heartbeat.  EKG was completed showing atrial fibs with controlled heart rate.  He endorsed recent viral upper respiratory infection and patient wrote for 25 mg metoprolol every 12 hours for elevated heart rate.      Since last being seen in the office patient reports***.  Patient denies chest pain, palpitations, dyspnea, PND, orthopnea, nausea, vomiting, dizziness, syncope, edema, weight gain, or early satiety.     ***Notes:  Home Medications    Current Outpatient Medications  Medication Sig Dispense Refill   allopurinol (ZYLOPRIM) 300 MG tablet Take 1 tablet (300 mg total) by mouth daily. 90 tablet 0   amphetamine-dextroamphetamine (ADDERALL) 10 MG tablet Take 1 tablet (10 mg total) by mouth 2 (two) times daily. 60 tablet 0   benzonatate (TESSALON PERLES) 100 MG capsule Take 1 capsule (100 mg total) by mouth 3 (three) times daily as needed for cough. 30 capsule 0   Cholecalciferol 50 MCG (2000 UT) TBDP Take 2 tablets by mouth daily. 30 tablet    fluticasone (FLONASE) 50 MCG/ACT nasal spray  Place 1 spray into both nostrils 2 (two) times daily. 16 g 0   levothyroxine (SYNTHROID) 137 MCG tablet TAKE 1 TABLET BY MOUTH EVERY DAY BEFORE BREAKFAST 90 tablet 3   metoprolol succinate (TOPROL XL) 25 MG 24 hr tablet Take 1 tablet (25 mg total) by mouth daily. 30 tablet 2   metoprolol tartrate (LOPRESSOR) 25 MG tablet TAKE ONE TABLET BY MOUTH EVERY 12 HOURS AS NEEDED FOR TACHYCARDIA 180 tablet 1   oxyCODONE (OXY IR/ROXICODONE) 5 MG immediate release tablet oxycodone 5 mg tablet  Take 1 tablet twice a day by oral route as needed.      sildenafil (VIAGRA) 100 MG tablet Take 0.5-1 tablets (50-100 mg total) by mouth daily as needed for erectile dysfunction. 30 tablet 5   terbinafine (LAMISIL) 250 MG tablet Take 1 tablet (250 mg total) by mouth daily. 90 tablet 0   warfarin (COUMADIN) 5 MG tablet TAKE 2 1/2 TABLETS DAILY EXCEPT TAKE 2 TABLETS ON MONDAYS, WEDNESDAYS, AND FRIDAYS. OR TAKE AS DIRECTED BY ANTICOAGULATION CLINIC 210 tablet 1   No current facility-administered medications for this visit.     Review of Systems  Please see the history of present illness.    (+)*** (+)***  All other systems reviewed and are otherwise negative except as noted above.  Physical Exam    Wt Readings from Last 3 Encounters:  06/30/22 231 lb 6.4 oz (105 kg)  06/21/22 232 lb 1.6 oz (105.3 kg)  06/16/22 232 lb 8 oz (105.5 kg)   [id number] were no vitals filed for this visit.,There is no height or weight on file to calculate BMI.  Constitutional:      Appearance: Healthy appearance. Not in distress.  Neck:     Vascular: JVD normal.  Pulmonary:     Effort: Pulmonary effort is normal.     Breath sounds: No wheezing. No rales. Diminished in the bases Cardiovascular:     Normal rate. Regular rhythm. Normal S1. Normal S2.      Murmurs: There is no murmur.  Edema:    Peripheral edema absent.  Abdominal:     Palpations: Abdomen is soft non tender. There is no hepatomegaly.  Skin:    General: Skin is warm and dry.  Neurological:     General: No focal deficit present.     Mental Status: Alert and oriented to person, place and time.     Cranial Nerves: Cranial nerves are intact.  EKG/LABS/Other Studies Reviewed    ECG personally reviewed by me today - ***  Risk Assessment/Calculations:   {Does this patient have ATRIAL FIBRILLATION?:[phone]}        Lab Results  Component Value Date   WBC 6.9 06/21/2022   HGB 15.3 06/21/2022   HCT 44.0 06/21/2022   MCV 91.3 06/21/2022   PLT 286.0 06/21/2022   Lab Results  Component  Value Date   CREATININE 0.92 06/21/2022   BUN 11 06/21/2022   NA 139 06/21/2022   K 4.6 06/21/2022   CL 101 06/21/2022   CO2 33 (H) 06/21/2022   Lab Results  Component Value Date   ALT 40 10/01/2020   AST 34 10/01/2020   ALKPHOS 62 10/01/2020   BILITOT 1.1 10/01/2020   Lab Results  Component Value Date   CHOL 171 07/26/2020   HDL 44 07/26/2020   LDLCALC 88 07/26/2020   LDLDIRECT 137.9 06/21/2012   TRIG 232 (H) 07/26/2020   CHOLHDL 3.9 07/26/2020    Lab Results  Component Value Date  HGBA1C  02/23/2009    5.8 (NOTE) The ADA recommends the following therapeutic goal for glycemic control related to Hgb A1c measurement: Goal of therapy: <6.5 Hgb A1c  Reference: American Diabetes Association: Clinical Practice Recommendations 2010, Diabetes Care, 2010, 33: (Suppl  1).    Assessment & Plan    1.  New onset atrial fibrillation: -Patient recently presented to PCP with complaint of irregular heartbeat and found to have AF with controlled rate by EKG -Today patient is rate controlled with atrial fibs/atrial flutter -He was recently prescribed as needed metoprolol but has yet to begin medication. -He is currently asymptomatic and denies any shortness of breath. -We will have him wear 14-day ZIO monitor to evaluate AF burden -We will start Toprol-XL 25 mg today -CHA2DS2-VASc Score = 2 [CHF History: 0, HTN History: 0, Diabetes History: 0, Stroke History: 0, Vascular Disease History: 1, Age Score: 1, Gender Score: 0].  Therefore, the patient's annual risk of stroke is 2.2 %.     2.  History of aortic stenosis with AVR repair: -s/p Saint Jude mechanical AVR repair in 2010 -SBE prophylaxis discussed -Continue Coumadin as managed by Coumadin clinic   3.  Mixed hyperlipidemia: -Patient's last LDL cholesterol was 88 slightly above goal of less than 70.   4.  Hypertrophic cardiomyopathy: -Patient's last 2D echo was completed 03/11/2021 showing moderate asymmetric left ventricular  hypertrophy of the basal septal segment. -Blood pressures currently well-controlled and repeat 2D echo will be discussed at follow-up.   5.  Hypothyroidism: -Continue current treatment plan per PCP      Disposition: Follow-up with Dorris Carnes, MD or APP in *** months {Are you ordering a CV Procedure (e.g. stress test, cath, DCCV, TEE, etc)?   Press F2        :[id number]   Medication Adjustments/Labs and Tests Ordered: Current medicines are reviewed at length with the patient today.  Concerns regarding medicines are outlined above.   Signed, Mable Fill, Marissa Nestle, NP 08/01/2022, 1:13 PM [facility]  Note:  This document was prepared using Dragon voice recognition software and may include unintentional dictation errors.

## 2022-08-02 ENCOUNTER — Telehealth: Payer: Self-pay | Admitting: *Deleted

## 2022-08-02 ENCOUNTER — Encounter: Payer: Self-pay | Admitting: Nurse Practitioner

## 2022-08-02 ENCOUNTER — Ambulatory Visit: Payer: Medicare Other | Attending: Nurse Practitioner | Admitting: Nurse Practitioner

## 2022-08-02 VITALS — BP 114/80 | HR 72 | Ht 74.0 in | Wt 229.4 lb

## 2022-08-02 DIAGNOSIS — I480 Paroxysmal atrial fibrillation: Secondary | ICD-10-CM | POA: Insufficient documentation

## 2022-08-02 DIAGNOSIS — G4733 Obstructive sleep apnea (adult) (pediatric): Secondary | ICD-10-CM | POA: Insufficient documentation

## 2022-08-02 DIAGNOSIS — E039 Hypothyroidism, unspecified: Secondary | ICD-10-CM

## 2022-08-02 DIAGNOSIS — E785 Hyperlipidemia, unspecified: Secondary | ICD-10-CM | POA: Insufficient documentation

## 2022-08-02 DIAGNOSIS — I359 Nonrheumatic aortic valve disorder, unspecified: Secondary | ICD-10-CM | POA: Diagnosis not present

## 2022-08-02 DIAGNOSIS — I422 Other hypertrophic cardiomyopathy: Secondary | ICD-10-CM | POA: Diagnosis not present

## 2022-08-02 MED ORDER — METOPROLOL TARTRATE 25 MG PO TABS: 25.0000 mg | ORAL_TABLET | Freq: Two times a day (BID) | ORAL | 3 refills | Status: DC

## 2022-08-02 MED ORDER — AMPHETAMINE-DEXTROAMPHETAMINE 10 MG PO TABS: 10.0000 mg | ORAL_TABLET | Freq: Two times a day (BID) | ORAL | 0 refills | Status: DC

## 2022-08-02 NOTE — Patient Instructions (Addendum)
Medication Instructions:  INCREASE Metoprolol to 57m Take 1 tablet twice a day  *If you need a refill on your cardiac medications before your next appointment, please call your pharmacy*   Lab Work: None ordered   Testing/Procedures: INurse, children'sYour physician has requested that you have an echocardiogram. Echocardiography is a painless test that uses sound waves to create images of your heart. It provides your doctor with information about the size and shape of your heart and how well your heart's chambers and valves are working. This procedure takes approximately one hour. There are no restrictions for this procedure. Please do NOT wear cologne, perfume, aftershave, or lotions (deodorant is allowed). Please arrive 15 minutes prior to your appointment time.   Follow-Up: [facility] needs [facility].  As part of our continuing mission to provide you with exceptional heart care, we have created designated Provider Care Teams.  These Care Teams include your primary Cardiologist (physician) and Advanced Practice Providers (APPs -  Physician Assistants and Nurse Practitioners) who all work together to provide you with the care you need, when you need it.  We recommend signing up for the patient portal called "MyChart".  Sign up information is provided on this After Visit Summary.  MyChart is used to connect with patients for Virtual Visits (Telemedicine).  Patients are able to view lab/test results, encounter notes, upcoming appointments, etc.  Non-urgent messages can be sent to your provider as well.   To learn more about what you can do with MyChart, go to [email or web address]    Your next appointment:   3 month(s)  Provider:   EAmbrose Pancoast NP  [facility] Other Instructions

## 2022-08-02 NOTE — Telephone Encounter (Signed)
Ambrose Pancoast, NP has ordered an [place] while the pt was in the office. Pt aware to not open the box until he has been called with the PIN#.   Pt agreeable to signed waiver.

## 2022-08-05 ENCOUNTER — Other Ambulatory Visit: Payer: Self-pay | Admitting: Family Medicine

## 2022-08-05 DIAGNOSIS — M109 Gout, unspecified: Secondary | ICD-10-CM

## 2022-08-07 NOTE — Telephone Encounter (Signed)
Called and made the patient aware that he may proceed with the [facility] Sleep Study. PIN # provided to the patient. Patient made aware that he will be contacted after the test has been read with the results and any recommendations. Patient verbalized understanding and thanked me for the call.    Pt asked for PIN# [id number] to be sent thru [place] as he was driving. Pt will do study one night this week.

## 2022-08-07 NOTE — Telephone Encounter (Signed)
Prior Authorization for [facility] sent to [facility] via web portal. Tracking Number .  READY- NO PA REQ

## 2022-08-16 ENCOUNTER — Ambulatory Visit (INDEPENDENT_AMBULATORY_CARE_PROVIDER_SITE_OTHER): Payer: Medicare Other

## 2022-08-16 ENCOUNTER — Ambulatory Visit: Payer: PRIVATE HEALTH INSURANCE

## 2022-08-16 DIAGNOSIS — Z7901 Long term (current) use of anticoagulants: Secondary | ICD-10-CM

## 2022-08-16 LAB — POCT INR: INR: 2.5 (ref 2.0–3.0)

## 2022-08-16 NOTE — Patient Instructions (Addendum)
Pre visit review using our clinic review tool, if applicable. No additional management support is needed unless otherwise documented below in the visit note.  Continue 2 tablets daily except take 2 1/2 tablets on Tuesday, Thursday and Saturday. Recheck in 6 weeks.

## 2022-08-16 NOTE — Progress Notes (Signed)
Continue 2 tablets daily except take 2 1/2 tablets on Tuesday, Thursday and Saturday. Recheck in 6 weeks.

## 2022-08-22 ENCOUNTER — Encounter (HOSPITAL_COMMUNITY): Payer: Self-pay | Admitting: Nurse Practitioner

## 2022-08-22 ENCOUNTER — Other Ambulatory Visit (HOSPITAL_COMMUNITY): Payer: Self-pay | Admitting: *Deleted

## 2022-08-22 ENCOUNTER — Ambulatory Visit (HOSPITAL_COMMUNITY)
Admission: RE | Admit: 2022-08-22 | Discharge: 2022-08-22 | Disposition: A | Discharge status: Home / Self Care | Payer: Medicare Other | Source: Ambulatory Visit | Attending: Nurse Practitioner | Admitting: Nurse Practitioner

## 2022-08-22 VITALS — BP 140/106 | HR 59 | Ht 74.0 in | Wt 236.6 lb

## 2022-08-22 DIAGNOSIS — I422 Other hypertrophic cardiomyopathy: Secondary | ICD-10-CM | POA: Diagnosis not present

## 2022-08-22 DIAGNOSIS — I4891 Unspecified atrial fibrillation: Secondary | ICD-10-CM | POA: Diagnosis not present

## 2022-08-22 DIAGNOSIS — I10 Essential (primary) hypertension: Secondary | ICD-10-CM | POA: Diagnosis not present

## 2022-08-22 DIAGNOSIS — Z79899 Other long term (current) drug therapy: Secondary | ICD-10-CM | POA: Insufficient documentation

## 2022-08-22 DIAGNOSIS — R9431 Abnormal electrocardiogram [ECG] [EKG]: Secondary | ICD-10-CM | POA: Insufficient documentation

## 2022-08-22 DIAGNOSIS — D6869 Other thrombophilia: Secondary | ICD-10-CM | POA: Diagnosis not present

## 2022-08-22 NOTE — Patient Instructions (Signed)
STOP metoprolol tartrate   START metoprolol succinate '25mg'$  once a day at bedtime   Start weekly INR checks - have the results forwarded to Rushie Goltz - will setup cardioversion once see INRs therapeutic

## 2022-08-22 NOTE — Progress Notes (Signed)
Primary Care Physician: Samuel Post, MD Referring Physician: Ambrose Pancoast, NP  Cardiologist: Dr. Karie Mainland is a 72 y.o. 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  he was referred by Ambrose Pancoast, NP, from his visit 2/21. Pt is fatigued in afib, thinks some fatigue is form BB. He is rate controlled. He is on coumadin for h/o of AV replacement. He was nost recently therapeutic, 08/16/22 with an INR of 2.5. Echo  03/11/2021 showed moderate asymmetric LV hypertrophy of the basal-septal segment with a EF of 60-65%.    Today, he denies symptoms of palpitations, chest pain, shortness of breath, orthopnea, PND, lower extremity edema, dizziness, presyncope, syncope, or neurologic sequela. The patient is tolerating medications without difficulties and is otherwise without complaint today.   Past Medical History:  Diagnosis Date   Aortic aneurysm ([place])    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   Dyslipidemia    Gout    Hemorrhoids    HEMORRHOIDS-INTERNAL 12/20/2009   History of diverticulitis 03/2018   HYPERLIPIDEMIA-MIXED 04/01/2009   HYPOTHYROIDISM 08/26/2008   INSOMNIA, TRANSIENT 04/27/2009   Kidney stones    KNEE PAIN 11/02/2008   LATERAL EPICONDYLITIS 06/02/2009   Migraines    ONYCHOMYCOSIS 11/02/2008   PERSONAL HX COLONIC POLYPS 12/20/2009   Sudden visual loss 12/25/2008   TRANSIENT ISCHEMIC ATTACK 01/25/2009   Past Surgical History:  Procedure Laterality Date   CYSTECTOMY     from abdominal wall   MYRINGOTOMY WITH TUBE PLACEMENT Left    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   TONSILLECTOMY      Current Outpatient Medications  Medication Sig Dispense Refill   allopurinol (ZYLOPRIM) 300 MG tablet TAKE 1 TABLET BY MOUTH DAILY 90 tablet 0   amphetamine-dextroamphetamine (ADDERALL) 10 MG tablet Take 1 tablet (10 mg  total) by mouth 2 (two) times daily. 60 tablet 0   Cholecalciferol 50 MCG (2000 UT) TBDP Take 2 tablets by mouth daily. 30 tablet    ketoconazole (NIZORAL) 2 % cream Apply topically 2 (two) times daily.     levothyroxine (SYNTHROID) 137 MCG tablet TAKE 1 TABLET BY MOUTH EVERY DAY BEFORE BREAKFAST 90 tablet 3   oxyCODONE (OXY IR/ROXICODONE) 5 MG immediate release tablet oxycodone 5 mg tablet  Take 1 tablet twice a day by oral route as needed.     sildenafil (VIAGRA) 100 MG tablet Take 0.5-1 tablets (50-100 mg total) by mouth daily as needed for erectile dysfunction. 30 tablet 5   terbinafine (LAMISIL) 250 MG tablet Take 1 tablet (250 mg total) by mouth daily. 90 tablet 0   warfarin (COUMADIN) 5 MG tablet TAKE 2 1/2 TABLETS DAILY EXCEPT TAKE 2 TABLETS ON MONDAYS, WEDNESDAYS, AND FRIDAYS. OR TAKE AS DIRECTED BY ANTICOAGULATION CLINIC 210 tablet 1   metoprolol succinate (TOPROL XL) 25 MG 24 hr tablet Take 1 tablet (25 mg total) by mouth daily. (Patient not taking: Reported on 08/22/2022) 30 tablet 2   No current facility-administered medications for this encounter.    Allergies  Allergen Reactions   Shellfish Allergy Anaphylaxis   Penicillins     REACTION: childhood, reaction unknown    Social History   Socioeconomic History   Marital status: Married    Spouse name: Not on file   Number of children: Not on file   Years of education: Not  on file   Highest education level: Not on file  Occupational History   Not on file  Tobacco Use   Smoking status: Former   Smokeless tobacco: Never  Vaping Use   Vaping Use: Never used  Substance and Sexual Activity   Alcohol use: Yes    Alcohol/week: 14.0 standard drinks of alcohol    Types: 14 Glasses of wine per week    Comment: couple glasses of wine each day   Drug use: No   Sexual activity: Not on file  Other Topics Concern   Not on file  Social History Narrative   Not on file   Social Determinants of Health   Financial Resource  Strain: Low Risk  (12/28/2020)   Overall Financial Resource Strain (CARDIA)    Difficulty of Paying Living Expenses: Not hard at all  Food Insecurity: No Food Insecurity (03/25/2021)   Hunger Vital Sign    Worried About Running Out of Food in the Last Year: Never true    Ran Out of Food in the Last Year: Never true  Transportation Needs: No Transportation Needs (03/25/2021)   PRAPARE - Hydrologist (Medical): No    Lack of Transportation (Non-Medical): No  Physical Activity: Insufficiently Active (03/25/2021)   Exercise Vital Sign    Days of Exercise per Week: 3 days    Minutes of Exercise per Session: 40 min  Stress: No Stress Concern Present (03/25/2021)   [place]    Feeling of Stress : Only a little  Social Connections: Moderately Integrated (03/25/2021)   Social Connection and Isolation Panel [NHANES]    Frequency of Communication with Friends and Family: Three times a week    Frequency of Social Gatherings with Friends and Family: Three times a week    Attends Religious Services: Never    Active Member of Clubs or Organizations: Yes    Attends Archivist Meetings: More than 4 times per year    Marital Status: Married  Human resources officer Violence: Not At Risk (03/25/2021)   Humiliation, Afraid, Rape, and Kick questionnaire    Fear of Current or Ex-Partner: No    Emotionally Abused: No    Physically Abused: No    Sexually Abused: No    Family History  Problem Relation Age of Onset   Diabetes Mother    Heart disease Father        valve problem    ROS- All systems are reviewed and negative except as per the HPI above  Physical Exam: Vitals:   08/22/22 1104  BP: (!) 140/106  Pulse: (!) 59  Weight: 107.3 kg  Height: '6\' 2"'$  (1.88 m)   Wt Readings from Last 3 Encounters:  08/22/22 107.3 kg  08/02/22 104.1 kg  06/30/22 105 kg    Labs: Lab Results  Component  Value Date   NA 139 06/21/2022   K 4.6 06/21/2022   CL 101 06/21/2022   CO2 33 (H) 06/21/2022   GLUCOSE 100 (H) 06/21/2022   BUN 11 06/21/2022   CREATININE 0.92 06/21/2022   CALCIUM 9.4 06/21/2022   MG 2.3 06/21/2022   Lab Results  Component Value Date   INR 2.5 08/16/2022   Lab Results  Component Value Date   CHOL 171 07/26/2020   HDL 44 07/26/2020   LDLCALC 88 07/26/2020   TRIG 232 (H) 07/26/2020     GEN- The patient is well appearing, alert and oriented x  3 today.   Head- normocephalic, atraumatic Eyes-  Sclera clear, conjunctiva pink Ears- hearing intact Oropharynx- clear Neck- supple, no JVP Lymph- no cervical lymphadenopathy Lungs- Clear to ausculation bilaterally, normal work of breathing Heart- irregular rate and rhythm, no murmurs, rubs or gallops, PMI not laterally displaced GI- soft, NT, ND, + BS Extremities- no clubbing, cyanosis, or edema MS- no significant deformity or atrophy Skin- no rash or lesion Psych- euthymic mood, full affect Neuro- strength and sensation are intact  EKG-Vent. rate 59 BPM PR interval * ms QRS duration 92 ms QT/QTcB 418/413 ms P-R-T axes * 71 72 Atrial fibrillation with slow ventricular response with premature ventricular or aberrantly conducted complexes Abnormal ECG When compared with ECG of 02-Mar-2009 21:28, PREVIOUS ECG IS PRESENT Afib is new compared to prior tracing Confirmed by Carlyle Dolly [id number]) on 08/22/2022 11:32:50 AM  Echo [id number]. 1. Left ventricular ejection fraction, by estimation, is 60 to 65%. The  left ventricle has normal function. The left ventricle has no regional  wall motion abnormalities. There is moderate asymmetric left ventricular  hypertrophy of the basal-septal  segment. Left ventricular diastolic parameters were normal.   2. Right ventricular systolic function is normal. The right ventricular  size is normal. Tricuspid regurgitation signal is inadequate for assessing  PA pressure.   3.  Left atrial size was mildly dilated.   4. The mitral valve is normal in structure. Trivial mitral valve  regurgitation. No evidence of mitral stenosis.   5. There is a mechanical valve present in the aortic position      Aortic valve regurgitation is trivial. Echo findings are consistent  with normal structure and function of the aortic valve prosthesis. Aortic  valve mean gradient measures 7.0 mmHg.  11. The left ventricle has no regional wall motion abnormalities.    Assessment and Plan:  1. New onset afib, dx 06/2022 General education of afib with triggers discussed  Reporting fatigue with  BB Go back to  Toprol xl at hs  Discussed short term goal of pursing DCCV and then later pursing ablation  Risk vrs benefit of cardioversion discussed  He will need 4 weeks of therapeutic INR's with therapeutic INR am of cardioversion as well When these are received, will schedule CV   2. AVR Continue warfarin Followed by Dr. Harrington Challenger   3. HTN Elevated in the office today, usually well controlled   Will obtain consult with EP to consider  ablation in this patient with nonobstructive hypertrophic  cardiomyopathy

## 2022-08-23 ENCOUNTER — Telehealth: Payer: Self-pay | Admitting: Family Medicine

## 2022-08-23 NOTE — Telephone Encounter (Signed)
Noted.  Samuel [name] W [name] MD [facility]  

## 2022-08-23 NOTE — Telephone Encounter (Signed)
Patient will be having a cardiac procedure and needs his coumadin checked 1x4 prior to the procedure and it has to be above 2.0 each time. Please advise.

## 2022-08-23 NOTE — Telephone Encounter (Addendum)
Pt will have cardioversion in the near future.  Must have 4 weekly INR checks in range to perform cardioversion.  Contacted pt and scheduled first of the 4 weekly visits for INR check for 3/18 at 10:30. Will schedule the other 3 weeks when pt is in on 3/18. Pt verbalized understanding.

## 2022-08-28 ENCOUNTER — Ambulatory Visit (INDEPENDENT_AMBULATORY_CARE_PROVIDER_SITE_OTHER): Payer: Medicare Other

## 2022-08-28 DIAGNOSIS — Z7901 Long term (current) use of anticoagulants: Secondary | ICD-10-CM

## 2022-08-28 LAB — POCT INR: INR: 2.8 (ref 2.0–3.0)

## 2022-08-28 NOTE — Progress Notes (Signed)
Pt will be scheduled for cardioversion after 4 weekly therapeutic INR results.  Continue 2 tablets daily except take 2 1/2 tablets on Tuesday, Thursday and Saturday. Recheck in 1 weeks.

## 2022-08-28 NOTE — Patient Instructions (Addendum)
Pre visit review using our clinic review tool, if applicable. No additional management support is needed unless otherwise documented below in the visit note.  Continue 2 tablets daily except take 2 1/2 tablets on Tuesday, Thursday and Saturday. Recheck in 1 weeks.

## 2022-08-29 ENCOUNTER — Encounter: Payer: Self-pay | Admitting: Family Medicine

## 2022-08-29 MED ORDER — AMPHETAMINE-DEXTROAMPHETAMINE 10 MG PO TABS: 10.0000 mg | ORAL_TABLET | Freq: Two times a day (BID) | ORAL | 0 refills | Status: DC

## 2022-08-29 MED ORDER — METOPROLOL SUCCINATE ER 25 MG PO TB24: 25.0000 mg | ORAL_TABLET | Freq: Every day | ORAL | 2 refills | Status: DC

## 2022-08-29 NOTE — Telephone Encounter (Signed)
I went ahead and send in refill of Adderall and also 3 months worth (ie refill with 2 additional refills)-so he should be covered for the next 3 months  Eulas Post MD [facility]

## 2022-08-30 ENCOUNTER — Ambulatory Visit (HOSPITAL_COMMUNITY): Payer: Medicare Other | Attending: Nurse Practitioner

## 2022-08-30 DIAGNOSIS — E039 Hypothyroidism, unspecified: Secondary | ICD-10-CM

## 2022-08-30 DIAGNOSIS — Z952 Presence of prosthetic heart valve: Secondary | ICD-10-CM | POA: Diagnosis not present

## 2022-08-30 DIAGNOSIS — G4733 Obstructive sleep apnea (adult) (pediatric): Secondary | ICD-10-CM | POA: Diagnosis not present

## 2022-08-30 DIAGNOSIS — I422 Other hypertrophic cardiomyopathy: Secondary | ICD-10-CM | POA: Insufficient documentation

## 2022-08-30 DIAGNOSIS — I480 Paroxysmal atrial fibrillation: Secondary | ICD-10-CM | POA: Diagnosis not present

## 2022-08-30 DIAGNOSIS — I359 Nonrheumatic aortic valve disorder, unspecified: Secondary | ICD-10-CM | POA: Diagnosis not present

## 2022-08-30 DIAGNOSIS — I4891 Unspecified atrial fibrillation: Secondary | ICD-10-CM | POA: Diagnosis not present

## 2022-08-30 DIAGNOSIS — E785 Hyperlipidemia, unspecified: Secondary | ICD-10-CM | POA: Diagnosis not present

## 2022-08-30 LAB — ECHOCARDIOGRAM COMPLETE
AV Mean grad: 7.8 mmHg
AV Peak grad: 13.1 mmHg
Ao pk vel: 1.81 m/s
S' Lateral: 3.6 cm

## 2022-08-31 ENCOUNTER — Encounter: Payer: Self-pay | Admitting: Cardiovascular Disease

## 2022-09-04 ENCOUNTER — Ambulatory Visit (INDEPENDENT_AMBULATORY_CARE_PROVIDER_SITE_OTHER): Payer: Medicare Other

## 2022-09-04 ENCOUNTER — Ambulatory Visit: Payer: Medicare Other

## 2022-09-04 DIAGNOSIS — Z7901 Long term (current) use of anticoagulants: Secondary | ICD-10-CM

## 2022-09-04 LAB — POCT INR: INR: 2.2 (ref 2.0–3.0)

## 2022-09-04 NOTE — Progress Notes (Signed)
Pt will be scheduled for cardioversion after 4 weekly therapeutic INR results.  This reading is week 2.  Continue 2 tablets daily except take 2 1/2 tablets on Tuesday, Thursday and Saturday. Recheck in 1 weeks.  Sent result to cardiology nurse.

## 2022-09-04 NOTE — Patient Instructions (Addendum)
Pre visit review using our clinic review tool, if applicable. No additional management support is needed unless otherwise documented below in the visit note.  Continue 2 tablets daily except take 2 1/2 tablets on Tuesday, Thursday and Saturday. Recheck in 1 weeks.

## 2022-09-05 ENCOUNTER — Encounter (HOSPITAL_COMMUNITY): Payer: Self-pay

## 2022-09-06 ENCOUNTER — Ambulatory Visit: Payer: Medicare Other

## 2022-09-11 ENCOUNTER — Encounter: Payer: Self-pay | Admitting: Family Medicine

## 2022-09-11 ENCOUNTER — Ambulatory Visit (INDEPENDENT_AMBULATORY_CARE_PROVIDER_SITE_OTHER): Payer: Medicare Other | Admitting: Family Medicine

## 2022-09-11 VITALS — BP 112/78 | HR 70 | Temp 98.0°F | Wt 233.0 lb

## 2022-09-11 DIAGNOSIS — I10 Essential (primary) hypertension: Secondary | ICD-10-CM

## 2022-09-11 DIAGNOSIS — I4821 Permanent atrial fibrillation: Secondary | ICD-10-CM | POA: Diagnosis not present

## 2022-09-11 DIAGNOSIS — Z7901 Long term (current) use of anticoagulants: Secondary | ICD-10-CM

## 2022-09-11 MED ORDER — SILDENAFIL CITRATE 100 MG PO TABS: 50.0000 mg | ORAL_TABLET | Freq: Every day | ORAL | 1 refills | Status: DC | PRN

## 2022-09-11 NOTE — Progress Notes (Signed)
   Subjective:    Patient ID: Samuel [name], male    DOB: [birth year], 72 y.o.   MRN: [id number]  HPI This is a patient of Dr. Elease Hashimoto who asks my opinion of something while Dr. Elease Hashimoto is out of the office. He has permanent atrial fibrillation, and he sees Dr. Dorris Carnes for cardiology care. He began to experience spells of rapid heart beats that lasted for hours at a time about 3 months ago. He has never felt chest pain or SOB during these spells. He converted to NSR a few times at first, but now he is in atrial fib constantly. He has been seeing Cardiology and they have proposed trying a cardioversion procedure, and then if that fails to do an ablation procedure. Of note he is on permanent anticoagulation with Coumadin after he had a bicuspid aortic valve replaced with a mechanical valve. His heart rate and BP have been very well controlled and he denies any limitation of his activities. An ECHO on 03-11-21 showed an EF of 60-65% and a mildly dilated left atrium. Another ECHO on 08-30-22 showed an EF of 55-60% and a severely dilated left atrium. He asks our opinion of whether he should proceed with a cardioversion or not. He is very concerned about things going wrong during the procedures.    Review of Systems  Constitutional: Negative.   Respiratory: Negative.    Cardiovascular: Negative.   Neurological: Negative.        Objective:   Physical Exam Constitutional:      Appearance: Normal appearance. He is not ill-appearing.  Cardiovascular:     Rate and Rhythm: Normal rate. Rhythm irregular.     Pulses: Normal pulses.     Heart sounds: Normal heart sounds.  Pulmonary:     Effort: Pulmonary effort is normal.     Breath sounds: Normal breath sounds.  Musculoskeletal:     Right lower leg: No edema.     Left lower leg: No edema.  Neurological:     General: No focal deficit present.     Mental Status: He is alert and oriented to person, place, and time.           Assessment &  Plan:  He is on chronic anticoagulation with Coumadin for a mechanical heart valve, and he has been doing very well. His BP and HR are well controlled. I told him the main goal of a cardioversion or ablation is to get off anticoagulants, but in his case this will never be possible. Also with his severely dilated left atrium, I think the chances of long term success at keeping him in sinus rhythm are slim. If we weigh the risks versus the benefits of the proposed procedures, I would advise against having them performed. He said he will think about this but he is leaning toward the same conclusion.  We spent a total of (35   ) minutes reviewing records and discussing these issues.  Alysia Penna, MD

## 2022-09-13 ENCOUNTER — Ambulatory Visit: Payer: Medicare Other

## 2022-09-13 ENCOUNTER — Ambulatory Visit (INDEPENDENT_AMBULATORY_CARE_PROVIDER_SITE_OTHER): Payer: Medicare Other

## 2022-09-13 DIAGNOSIS — Z7901 Long term (current) use of anticoagulants: Secondary | ICD-10-CM

## 2022-09-13 LAB — POCT INR: INR: 2.6 (ref 2.0–3.0)

## 2022-09-13 NOTE — Progress Notes (Signed)
Pt will be scheduled for cardioversion after 4 weekly therapeutic INR results. This reading is week 3. Continue 2 tablets daily except take 2 1/2 tablets on Tuesday, Thursday and Saturday. Recheck in 1 weeks.  Sent result to cardiology nurse.

## 2022-09-13 NOTE — Patient Instructions (Addendum)
Pre visit review using our clinic review tool, if applicable. No additional management support is needed unless otherwise documented below in the visit note.  Continue 2 tablets daily except take 2 1/2 tablets on Tuesday, Thursday and Saturday. Recheck in 1 weeks.  

## 2022-09-15 ENCOUNTER — Telehealth (HOSPITAL_COMMUNITY): Payer: Self-pay | Admitting: *Deleted

## 2022-09-15 NOTE — Telephone Encounter (Signed)
-----   Message from Sherrie George, RN sent at 09/13/2022  3:38 PM EDT ----- Regarding: RE: weekly inrs for cardioversion prep Hi Mattie Nordell, Weekly INR today is 2.6 Have a great day. Carollee Herter, RN ----- Message ----- From: Shona Simpson, RN Sent: 08/22/2022   3:31 PM EDT To: Sherrie George, RN Subject: weekly inrs for cardioversion prep             Pt needs weekly INR checks in preparation of cardioversion after 4 therapeutic INRs. Pt was to call and start weekly INRs. Please forward INR results each week. Thanks Radio [facility]

## 2022-09-15 NOTE — Telephone Encounter (Signed)
Pt with  3 INRs so far- left message with pt to get cardioversion scheduled. Will await call back.

## 2022-09-19 NOTE — Telephone Encounter (Signed)
Patient called back stating he would prefer to have consult with Dr. Nelly Laurence before scheduling cardioversion. Pt is continuing weekly INR checks at this time.

## 2022-09-20 ENCOUNTER — Ambulatory Visit (INDEPENDENT_AMBULATORY_CARE_PROVIDER_SITE_OTHER): Payer: Medicare Other

## 2022-09-20 DIAGNOSIS — Z7901 Long term (current) use of anticoagulants: Secondary | ICD-10-CM | POA: Diagnosis not present

## 2022-09-20 LAB — POCT INR: INR: 3.7 — AB (ref 2.0–3.0)

## 2022-09-20 NOTE — Progress Notes (Signed)
Pt will be scheduled for cardioversion after 4 weekly therapeutic INR results. This reading is week 4. Pt will have apt with cardiology on 4/12. Pt denies any changes and is not aware of anything that would cause this elevated INR.  Hold dose today and the change weekly dose to take 2 tablets daily except take 2 1/2 tablets on Tuesday and Saturday. Recheck in 3 weeks.  Sent result to cardiology nurse.

## 2022-09-20 NOTE — Patient Instructions (Addendum)
Pre visit review using our clinic review tool, if applicable. No additional management support is needed unless otherwise documented below in the visit note.  Hold dose today and the change weekly dose to take 2 tablets daily except take 2 1/2 tablets on Tuesday and Saturday. Recheck in 3 weeks.

## 2022-09-22 ENCOUNTER — Telehealth: Payer: Self-pay | Admitting: Family Medicine

## 2022-09-22 ENCOUNTER — Ambulatory Visit: Payer: Medicare Other | Attending: Cardiovascular Disease | Admitting: Cardiovascular Disease

## 2022-09-22 ENCOUNTER — Encounter: Payer: Self-pay | Admitting: Cardiovascular Disease

## 2022-09-22 ENCOUNTER — Telehealth: Payer: Self-pay

## 2022-09-22 VITALS — BP 118/74 | HR 60 | Ht 73.0 in | Wt 235.4 lb

## 2022-09-22 DIAGNOSIS — I4891 Unspecified atrial fibrillation: Secondary | ICD-10-CM | POA: Insufficient documentation

## 2022-09-22 DIAGNOSIS — I4819 Other persistent atrial fibrillation: Secondary | ICD-10-CM | POA: Insufficient documentation

## 2022-09-22 NOTE — Telephone Encounter (Signed)
Requesting patient have an INR feach week for the next 2 weeks prior to his cardioversion on 10/05/22

## 2022-09-22 NOTE — Telephone Encounter (Signed)
Called Dr Lucie Leather office (manages patients coumadin) to make them aware of weekly INR needed for upcoming cardioversion on 4/25 - already had 2 previous weeks completed.

## 2022-09-22 NOTE — H&P (View-Only) (Signed)
Electrophysiology Office Note:    Date:  09/22/2022   ID:  Samuel [name], DOB 05/06/1951, MRN [id number]  PCP:  Burchette, Bruce W, MD   [facility] Providers Cardiologist:  Paula Ross, MD     Referring MD: Carroll, Donna C, NP   History of Present Illness:    Samuel [name] is a 72 y.o. male with a hx listed below, significant for bicuspid aorta s/p saint Jude mechanical AVR and ascending aortic root replacement in 2010, TIA, hypothyroidism, chronic use of coumadin referred for arrhythmia management.  New onset AF diagnosed in January, 2024.  He noticed that his heart rates were very elevated while playing golf.  He has had slowly progressive fatigue over the past year.   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   Dyslipidemia    Gout    Hemorrhoids    HEMORRHOIDS-INTERNAL 12/20/2009   History of diverticulitis 03/2018   HYPERLIPIDEMIA-MIXED 04/01/2009   HYPOTHYROIDISM 08/26/2008   INSOMNIA, TRANSIENT 04/27/2009   Kidney stones    KNEE PAIN 11/02/2008   LATERAL EPICONDYLITIS 06/02/2009   Migraines    ONYCHOMYCOSIS 11/02/2008   PERSONAL HX COLONIC POLYPS 12/20/2009   Sudden visual loss 12/25/2008   TRANSIENT ISCHEMIC ATTACK 01/25/2009    Past Surgical History:  Procedure Laterality Date   CYSTECTOMY     from abdominal wall   MYRINGOTOMY WITH TUBE PLACEMENT Left    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   TONSILLECTOMY      Current Medications: Current Meds  Medication Sig   allopurinol (ZYLOPRIM) 300 MG tablet TAKE 1 TABLET BY MOUTH DAILY   amphetamine-dextroamphetamine (ADDERALL) 10 MG tablet Take 1 tablet (10 mg total) by mouth 2 (two) times daily.   amphetamine-dextroamphetamine (ADDERALL) 10 MG tablet Take 1 tablet (10 mg total) by mouth 2 (two) times daily.   amphetamine-dextroamphetamine (ADDERALL) 10 MG tablet Take 1 tablet (10 mg total) by mouth 2 (two) times daily.   Cholecalciferol 50 MCG  (2000 UT) TBDP Take 2 tablets by mouth daily.   ketoconazole (NIZORAL) 2 % cream Apply topically 2 (two) times daily.   levothyroxine (SYNTHROID) 137 MCG tablet TAKE 1 TABLET BY MOUTH EVERY DAY BEFORE BREAKFAST   metoprolol succinate (TOPROL XL) 25 MG 24 hr tablet Take 1 tablet (25 mg total) by mouth daily.   oxyCODONE (OXY IR/ROXICODONE) 5 MG immediate release tablet oxycodone 5 mg tablet  Take 1 tablet twice a day by oral route as needed.   sildenafil (VIAGRA) 100 MG tablet Take 0.5-1 tablets (50-100 mg total) by mouth daily as needed for erectile dysfunction.   terbinafine (LAMISIL) 250 MG tablet Take 1 tablet (250 mg total) by mouth daily.   warfarin (COUMADIN) 5 MG tablet TAKE 2 1/2 TABLETS DAILY EXCEPT TAKE 2 TABLETS ON MONDAYS, WEDNESDAYS, AND FRIDAYS. OR TAKE AS DIRECTED BY ANTICOAGULATION CLINIC     Allergies:   Shellfish allergy and Penicillins   Social and Family History: Reviewed in Epic  ROS:   Please see the history of present illness.    All other systems reviewed and are negative.  EKGs/Labs/Other Studies Reviewed Today:    Echocardiogram:  TTE 08/30/2022  1. Left ventricular ejection fraction, by estimation, is 55 to 60%. The  left ventricle has normal function. The left ventricle has no regional  wall motion abnormalities. There is mild concentric left ventricular  hypertrophy. Left ventricular diastolic function could   not be evaluated.   2. Right ventricular systolic function is low normal. The right  ventricular size is normal. There is normal pulmonary artery systolic  pressure. The estimated right ventricular systolic pressure is 24.2 mmHg.   3. Left atrial size was severely dilated.   4. Right atrial size was severely dilated.   5. The mitral valve is normal in structure. Trivial mitral valve  regurgitation. No evidence of mitral stenosis.   6. The aortic valve has been repaired/replaced. Aortic valve  regurgitation is trivial. There is a mechanical valve  present in the  aortic position. Procedure Date: 2010. Echo findings are consistent with  normal structure and function of the aortic valve  prosthesis. Aortic valve mean gradient measures 7.8 mmHg. Aortic valve Vmax measures 1.81 m/s. Aortic valve acceleration time measures 60 msec.   7. Aortic dilatation noted. There is borderline dilatation of the  ascending aorta, measuring 39 mm.   8. The inferior vena cava is dilated in size with >50% respiratory  variability, suggesting right atrial pressure of 8 mmHg.    Monitors:  ZioPatch 07/05/2022 100% AF HR 37-210 bpm, avg 72 5.5% PVCs Triggered events are associated with PVCs  Stress testing:   Advanced imaging:     EKG:  Last EKG results: today - AF and PVC   Recent Labs: 06/21/2022: BUN 11; Creatinine, Ser 0.92; Hemoglobin 15.3; Magnesium 2.3; Platelets 286.0; Potassium 4.6; Sodium 139; TSH 1.23     Physical Exam:    VS:  BP 118/74   Pulse 60   Ht 6' 1" (1.854 m)   Wt 235 lb 6.4 oz (106.8 kg)   SpO2 95%   BMI 31.06 kg/m     Wt Readings from Last 3 Encounters:  09/22/22 235 lb 6.4 oz (106.8 kg)  09/11/22 233 lb (105.7 kg)  08/22/22 236 lb 9.6 oz (107.3 kg)     GEN: Well nourished, well developed in no acute distress CARDIAC: RRR, no murmurs, rubs, gallops RESPIRATORY:  Normal work of breathing MUSCULOSKELETAL: no edema    ASSESSMENT & PLAN:    Atrial fibrillation Persistent Bilateral atria are severely dilated Continue warfarin for atrial fibrillation and presence of a mechanical aortic valve Plan for DC cardioversion to assess symptoms in sinus rhythm Follow-up 1 month We began to discuss ablation briefly  St. Jude mechanical aortic valve in place Continue warfarin        Medication Adjustments/Labs and Tests Ordered: Current medicines are reviewed at length with the patient today.  Concerns regarding medicines are outlined above.  Orders Placed This Encounter  Procedures   EKG 12-Lead   No  orders of the defined types were placed in this encounter.    Signed, Marly Schuld E Jacquelinne Speak, MD  09/22/2022 10:22 AM    [facility] 

## 2022-09-22 NOTE — Progress Notes (Signed)
Electrophysiology Office Note:    Date:  09/22/2022   ID:  Samuel [name], DOB 1950/10/01, MRN [id number]  PCP:  Kristian Covey, MD   [facility] Providers Cardiologist:  Dietrich Pates, MD     Referring MD: Newman Nip, NP   History of Present Illness:    Samuel [name] is a 72 y.o. male with a hx listed below, significant for bicuspid aorta s/p saint Jude mechanical AVR and ascending aortic root replacement in 2010, TIA, hypothyroidism, chronic use of coumadin referred for arrhythmia management.  New onset AF diagnosed in January, 2024.  He noticed that his heart rates were very elevated while playing golf.  He has had slowly progressive fatigue over the past year.   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   Dyslipidemia    Gout    Hemorrhoids    HEMORRHOIDS-INTERNAL 12/20/2009   History of diverticulitis 03/2018   HYPERLIPIDEMIA-MIXED 04/01/2009   HYPOTHYROIDISM 08/26/2008   INSOMNIA, TRANSIENT 04/27/2009   Kidney stones    KNEE PAIN 11/02/2008   LATERAL EPICONDYLITIS 06/02/2009   Migraines    ONYCHOMYCOSIS 11/02/2008   PERSONAL HX COLONIC POLYPS 12/20/2009   Sudden visual loss 12/25/2008   TRANSIENT ISCHEMIC ATTACK 01/25/2009    Past Surgical History:  Procedure Laterality Date   CYSTECTOMY     from abdominal wall   MYRINGOTOMY WITH TUBE PLACEMENT Left    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   TONSILLECTOMY      Current Medications: Current Meds  Medication Sig   allopurinol (ZYLOPRIM) 300 MG tablet TAKE 1 TABLET BY MOUTH DAILY   amphetamine-dextroamphetamine (ADDERALL) 10 MG tablet Take 1 tablet (10 mg total) by mouth 2 (two) times daily.   amphetamine-dextroamphetamine (ADDERALL) 10 MG tablet Take 1 tablet (10 mg total) by mouth 2 (two) times daily.   amphetamine-dextroamphetamine (ADDERALL) 10 MG tablet Take 1 tablet (10 mg total) by mouth 2 (two) times daily.   Cholecalciferol 50 MCG  (2000 UT) TBDP Take 2 tablets by mouth daily.   ketoconazole (NIZORAL) 2 % cream Apply topically 2 (two) times daily.   levothyroxine (SYNTHROID) 137 MCG tablet TAKE 1 TABLET BY MOUTH EVERY DAY BEFORE BREAKFAST   metoprolol succinate (TOPROL XL) 25 MG 24 hr tablet Take 1 tablet (25 mg total) by mouth daily.   oxyCODONE (OXY IR/ROXICODONE) 5 MG immediate release tablet oxycodone 5 mg tablet  Take 1 tablet twice a day by oral route as needed.   sildenafil (VIAGRA) 100 MG tablet Take 0.5-1 tablets (50-100 mg total) by mouth daily as needed for erectile dysfunction.   terbinafine (LAMISIL) 250 MG tablet Take 1 tablet (250 mg total) by mouth daily.   warfarin (COUMADIN) 5 MG tablet TAKE 2 1/2 TABLETS DAILY EXCEPT TAKE 2 TABLETS ON MONDAYS, WEDNESDAYS, AND FRIDAYS. OR TAKE AS DIRECTED BY ANTICOAGULATION CLINIC     Allergies:   Shellfish allergy and Penicillins   Social and Family History: Reviewed in Epic  ROS:   Please see the history of present illness.    All other systems reviewed and are negative.  EKGs/Labs/Other Studies Reviewed Today:    Echocardiogram:  TTE 08/30/2022  1. Left ventricular ejection fraction, by estimation, is 55 to 60%. The  left ventricle has normal function. The left ventricle has no regional  wall motion abnormalities. There is mild concentric left ventricular  hypertrophy. Left ventricular diastolic function could  not be evaluated.   2. Right ventricular systolic function is low normal. The right  ventricular size is normal. There is normal pulmonary artery systolic  pressure. The estimated right ventricular systolic pressure is 24.2 mmHg.   3. Left atrial size was severely dilated.   4. Right atrial size was severely dilated.   5. The mitral valve is normal in structure. Trivial mitral valve  regurgitation. No evidence of mitral stenosis.   6. The aortic valve has been repaired/replaced. Aortic valve  regurgitation is trivial. There is a mechanical valve  present in the  aortic position. Procedure Date: 2010. Echo findings are consistent with  normal structure and function of the aortic valve  prosthesis. Aortic valve mean gradient measures 7.8 mmHg. Aortic valve Vmax measures 1.81 m/s. Aortic valve acceleration time measures 60 msec.   7. Aortic dilatation noted. There is borderline dilatation of the  ascending aorta, measuring 39 mm.   8. The inferior vena cava is dilated in size with >50% respiratory  variability, suggesting right atrial pressure of 8 mmHg.    Monitors:  ZioPatch 07/05/2022 100% AF HR 37-210 bpm, avg 72 5.5% PVCs Triggered events are associated with PVCs  Stress testing:   Advanced imaging:     EKG:  Last EKG results: today - AF and PVC   Recent Labs: 06/21/2022: BUN 11; Creatinine, Ser 0.92; Hemoglobin 15.3; Magnesium 2.3; Platelets 286.0; Potassium 4.6; Sodium 139; TSH 1.23     Physical Exam:    VS:  BP 118/74   Pulse 60   Ht 6\' 1"  (1.854 m)   Wt 235 lb 6.4 oz (106.8 kg)   SpO2 95%   BMI 31.06 kg/m     Wt Readings from Last 3 Encounters:  09/22/22 235 lb 6.4 oz (106.8 kg)  09/11/22 233 lb (105.7 kg)  08/22/22 236 lb 9.6 oz (107.3 kg)     GEN: Well nourished, well developed in no acute distress CARDIAC: RRR, no murmurs, rubs, gallops RESPIRATORY:  Normal work of breathing MUSCULOSKELETAL: no edema    ASSESSMENT & PLAN:    Atrial fibrillation Persistent Bilateral atria are severely dilated Continue warfarin for atrial fibrillation and presence of a mechanical aortic valve Plan for DC cardioversion to assess symptoms in sinus rhythm Follow-up 1 month We began to discuss ablation briefly  St. Jude mechanical aortic valve in place Continue warfarin        Medication Adjustments/Labs and Tests Ordered: Current medicines are reviewed at length with the patient today.  Concerns regarding medicines are outlined above.  Orders Placed This Encounter  Procedures   EKG 12-Lead   No  orders of the defined types were placed in this encounter.    Signed, Maurice Small, MD  09/22/2022 10:22 AM    [facility]

## 2022-09-22 NOTE — Patient Instructions (Signed)
Medication Instructions:  Your physician recommends that you continue on your current medications as directed. Please refer to the Current Medication list given to you today. *If you need a refill on your cardiac medications before your next appointment, please call your pharmacy*   Testing/Procedures: Cardioversion Your physician has recommended that you have a Cardioversion (DCCV). Electrical Cardioversion uses a jolt of electricity to your heart either through paddles or wired patches attached to your chest. This is a controlled, usually prescheduled, procedure. Defibrillation is done under light anesthesia in the hospital, and you usually go home the day of the procedure. This is done to get your heart back into a normal rhythm. You are not awake for the procedure. Please see the instruction sheet given to you today.    Follow-Up: At [facility], you and your health needs are our priority.  As part of our continuing mission to provide you with exceptional heart care, we have created designated Provider Care Teams.  These Care Teams include your primary Cardiologist (physician) and Advanced Practice Providers (APPs -  Physician Assistants and Nurse Practitioners) who all work together to provide you with the care you need, when you need it.  We recommend signing up for the patient portal called "MyChart".  Sign up information is provided on this After Visit Summary.  MyChart is used to connect with patients for Virtual Visits (Telemedicine).  Patients are able to view lab/test results, encounter notes, upcoming appointments, etc.  Non-urgent messages can be sent to your provider as well.   To learn more about what you can do with MyChart, go to [email or web address].    Your next appointment:   1 month (after 4/25)  Provider:   York Pellant, MD

## 2022-09-25 ENCOUNTER — Encounter: Payer: Self-pay | Admitting: Family Medicine

## 2022-09-25 NOTE — Telephone Encounter (Signed)
Pt contacted and scheduled for coumadin clinic INR check for next 2 Wednesdays.

## 2022-09-25 NOTE — Telephone Encounter (Signed)
Contacted pt to schedule next 2 weekly INR checks with coumadin clinic and noticed pt's msg concerning allopurinol. This interacts with warfarin and could cause subtherapeutic INRs if he is to stop this medication. Advised to wait until after cardioversion on 4/25 to decide if he wants to stop the medication due to the interaction. Pt verbalized understanding but would like to know if after the cardioversion it is ok for him to stop taking allopurinol.

## 2022-09-27 ENCOUNTER — Ambulatory Visit: Payer: Medicare Other

## 2022-09-27 ENCOUNTER — Ambulatory Visit (INDEPENDENT_AMBULATORY_CARE_PROVIDER_SITE_OTHER): Payer: Medicare Other

## 2022-09-27 DIAGNOSIS — Z7901 Long term (current) use of anticoagulants: Secondary | ICD-10-CM | POA: Diagnosis not present

## 2022-09-27 LAB — POCT INR: INR: 2.5 (ref 2.0–3.0)

## 2022-09-27 NOTE — Patient Instructions (Addendum)
Pre visit review using our clinic review tool, if applicable. No additional management support is needed unless otherwise documented below in the visit note.  Continue 2 tablets daily except take 2 1/2 tablets on Tuesday and Saturday. Recheck in 1 weeks.

## 2022-09-27 NOTE — Progress Notes (Addendum)
Pt will be scheduled for cardioversion after 6 weekly therapeutic INR results. This reading is week 5. Pt is scheduled for cardioversion on 4/25. Pt will have one more INR check on 4/24 before cardioversion. Continue 2 tablets daily except take 2 1/2 tablets on Tuesday and Saturday. Recheck in 1 weeks.  Sent result to cardiology nurse.

## 2022-09-27 NOTE — Telephone Encounter (Signed)
INR today is 2.5, and this is the 5th consecutive week of readings in preparation for cardioversion on 4/25

## 2022-10-02 ENCOUNTER — Telehealth: Payer: Self-pay | Admitting: Internal Medicine

## 2022-10-02 DIAGNOSIS — G894 Chronic pain syndrome: Secondary | ICD-10-CM | POA: Diagnosis not present

## 2022-10-02 DIAGNOSIS — M5030 Other cervical disc degeneration, unspecified cervical region: Secondary | ICD-10-CM | POA: Diagnosis not present

## 2022-10-02 DIAGNOSIS — M5136 Other intervertebral disc degeneration, lumbar region: Secondary | ICD-10-CM | POA: Diagnosis not present

## 2022-10-02 DIAGNOSIS — M5416 Radiculopathy, lumbar region: Secondary | ICD-10-CM | POA: Diagnosis not present

## 2022-10-02 DIAGNOSIS — M5412 Radiculopathy, cervical region: Secondary | ICD-10-CM | POA: Diagnosis not present

## 2022-10-02 DIAGNOSIS — M47896 Other spondylosis, lumbar region: Secondary | ICD-10-CM | POA: Diagnosis not present

## 2022-10-02 DIAGNOSIS — M47812 Spondylosis without myelopathy or radiculopathy, cervical region: Secondary | ICD-10-CM | POA: Diagnosis not present

## 2022-10-02 NOTE — Telephone Encounter (Signed)
Patient has multiple questions regarding his cardio version. Htime given, but wants to know before during and after information of procedure

## 2022-10-02 NOTE — Telephone Encounter (Signed)
What would like to know what time he needs to be at the hospital for 4/25 cardioversion. Please advise.

## 2022-10-04 ENCOUNTER — Ambulatory Visit (INDEPENDENT_AMBULATORY_CARE_PROVIDER_SITE_OTHER): Payer: Medicare Other

## 2022-10-04 DIAGNOSIS — Z7901 Long term (current) use of anticoagulants: Secondary | ICD-10-CM | POA: Diagnosis not present

## 2022-10-04 LAB — POCT INR: INR: 3 (ref 2.0–3.0)

## 2022-10-04 NOTE — Progress Notes (Addendum)
Scheduled for cardioversion tomorrow. Continue 2 tablets daily except take 2 1/2 tablets on Tuesday and Saturday. Recheck in 6 weeks.  Sent result to cardiology nurse.

## 2022-10-04 NOTE — Pre-Procedure Instructions (Signed)
Spoke to patient on phone about cardioversion tomorrow - come aat 0945, NPO after 0000, confirmed ride home and responsible person to stay with pt for 24 hours, take coumadin as prescribed

## 2022-10-04 NOTE — Patient Instructions (Addendum)
Pre visit review using our clinic review tool, if applicable. No additional management support is needed unless otherwise documented below in the visit note.  Continue 2 tablets daily except take 2 1/2 tablets on Tuesday and Saturday. Recheck in 6 weeks.

## 2022-10-04 NOTE — Telephone Encounter (Signed)
Spoke with patient about upcoming cardioversion. Reviewed letter with information on timing and expectations. No further questions or concerns at this time.

## 2022-10-05 ENCOUNTER — Encounter (HOSPITAL_COMMUNITY)
Admission: RE | Disposition: A | Discharge status: Home / Self Care | Payer: Self-pay | Source: Home / Self Care | Attending: Internal Medicine

## 2022-10-05 ENCOUNTER — Encounter: Payer: Self-pay | Admitting: Cardiovascular Disease

## 2022-10-05 ENCOUNTER — Ambulatory Visit (HOSPITAL_COMMUNITY)
Admission: RE | Admit: 2022-10-05 | Discharge: 2022-10-05 | Disposition: A | Discharge status: Home / Self Care | Payer: Medicare Other | Attending: Internal Medicine | Admitting: Internal Medicine

## 2022-10-05 ENCOUNTER — Other Ambulatory Visit: Payer: Self-pay

## 2022-10-05 ENCOUNTER — Encounter (HOSPITAL_COMMUNITY): Payer: Self-pay | Admitting: Internal Medicine

## 2022-10-05 ENCOUNTER — Ambulatory Visit (HOSPITAL_COMMUNITY): Payer: Medicare Other | Admitting: Certified Registered"

## 2022-10-05 ENCOUNTER — Ambulatory Visit (HOSPITAL_BASED_OUTPATIENT_CLINIC_OR_DEPARTMENT_OTHER): Payer: Medicare Other | Admitting: Certified Registered"

## 2022-10-05 DIAGNOSIS — E039 Hypothyroidism, unspecified: Secondary | ICD-10-CM

## 2022-10-05 DIAGNOSIS — I4819 Other persistent atrial fibrillation: Secondary | ICD-10-CM | POA: Diagnosis not present

## 2022-10-05 DIAGNOSIS — I4891 Unspecified atrial fibrillation: Secondary | ICD-10-CM

## 2022-10-05 DIAGNOSIS — Z953 Presence of xenogenic heart valve: Secondary | ICD-10-CM | POA: Diagnosis not present

## 2022-10-05 DIAGNOSIS — Z87891 Personal history of nicotine dependence: Secondary | ICD-10-CM

## 2022-10-05 DIAGNOSIS — Z8673 Personal history of transient ischemic attack (TIA), and cerebral infarction without residual deficits: Secondary | ICD-10-CM | POA: Insufficient documentation

## 2022-10-05 DIAGNOSIS — Z7901 Long term (current) use of anticoagulants: Secondary | ICD-10-CM | POA: Diagnosis not present

## 2022-10-05 DIAGNOSIS — I10 Essential (primary) hypertension: Secondary | ICD-10-CM | POA: Diagnosis not present

## 2022-10-05 HISTORY — PX: CARDIOVERSION: SHX1299

## 2022-10-05 LAB — POCT I-STAT, CHEM 8
BUN: 19 mg/dL (ref 8–23)
Calcium, Ion: 1.28 mmol/L (ref 1.15–1.40)
Chloride: 100 mmol/L (ref 98–111)
Creatinine, Ser: 0.8 mg/dL (ref 0.61–1.24)
Glucose, Bld: 109 mg/dL — ABNORMAL HIGH (ref 70–99)
HCT: 44 % (ref 39.0–52.0)
Hemoglobin: 15 g/dL (ref 13.0–17.0)
Potassium: 4 mmol/L (ref 3.5–5.1)
Sodium: 141 mmol/L (ref 135–145)
TCO2: 29 mmol/L (ref 22–32)

## 2022-10-05 SURGERY — CARDIOVERSION
Anesthesia: General

## 2022-10-05 MED ORDER — PROPOFOL 10 MG/ML IV BOLUS
INTRAVENOUS | Status: DC | PRN
  Administered 2022-10-05: 70 mg via INTRAVENOUS

## 2022-10-05 MED ORDER — SODIUM CHLORIDE 0.9 % IV SOLN: INTRAVENOUS | Status: DC | PRN

## 2022-10-05 MED ORDER — LIDOCAINE HCL (CARDIAC) PF 100 MG/5ML IV SOSY
PREFILLED_SYRINGE | INTRAVENOUS | Status: DC | PRN
  Administered 2022-10-05: 100 mg via INTRAVENOUS

## 2022-10-05 SURGICAL SUPPLY — 1 items: ELECT DEFIB PAD ADLT CADENCE (PAD) ×1 IMPLANT

## 2022-10-05 NOTE — Anesthesia Preprocedure Evaluation (Signed)
Anesthesia Evaluation  Patient identified by MRN, date of birth, ID band Patient awake    Reviewed: Allergy & Precautions, H&P , NPO status , Patient's Chart, lab work & pertinent test results  Airway Mallampati: II  TM Distance: >3 FB Neck ROM: Full    Dental no notable dental hx.    Pulmonary neg pulmonary ROS, former smoker   Pulmonary exam normal breath sounds clear to auscultation       Cardiovascular hypertension, Normal cardiovascular exam+ dysrhythmias Atrial Fibrillation  Rhythm:Irregular Rate:Normal     Neuro/Psych negative neurological ROS  negative psych ROS   GI/Hepatic negative GI ROS, Neg liver ROS,,,  Endo/Other  Hypothyroidism    Renal/GU negative Renal ROS  negative genitourinary   Musculoskeletal negative musculoskeletal ROS (+)    Abdominal   Peds negative pediatric ROS (+)  Hematology negative hematology ROS (+)   Anesthesia Other Findings   Reproductive/Obstetrics negative OB ROS                             Anesthesia Physical Anesthesia Plan  ASA: 3  Anesthesia Plan: General   Post-op Pain Management:    Induction: Intravenous  PONV Risk Score and Plan: 1 and Treatment may vary due to age or medical condition  Airway Management Planned: Mask  Additional Equipment:   Intra-op Plan:   Post-operative Plan:   Informed Consent: I have reviewed the patients History and Physical, chart, labs and discussed the procedure including the risks, benefits and alternatives for the proposed anesthesia with the patient or authorized representative who has indicated his/her understanding and acceptance.     Dental advisory given  Plan Discussed with: CRNA and Surgeon  Anesthesia Plan Comments:        Anesthesia Quick Evaluation

## 2022-10-05 NOTE — Transfer of Care (Signed)
Immediate Anesthesia Transfer of Care Note  Patient: Samuel [name]  Procedure(s) Performed: CARDIOVERSION  Patient Location: PACU and Cath Lab  Anesthesia Type:General  Level of Consciousness: drowsy and patient cooperative  Airway & Oxygen Therapy: Patient Spontanous Breathing and Patient connected to nasal cannula oxygen  Post-op Assessment: Report given to RN and Post -op Vital signs reviewed and stable  Post vital signs: Reviewed and stable  Last Vitals:  Vitals Value Taken Time  BP    Temp    Pulse    Resp    SpO2      Last Pain:  Vitals:   10/05/22 1034  TempSrc: Temporal         Complications: No notable events documented.

## 2022-10-05 NOTE — Anesthesia Postprocedure Evaluation (Signed)
Anesthesia Post Note  Patient: SANAD FEARNOW  Procedure(s) Performed: CARDIOVERSION     Patient location during evaluation: PACU Anesthesia Type: General Level of consciousness: awake and alert Pain management: pain level controlled Vital Signs Assessment: post-procedure vital signs reviewed and stable Respiratory status: spontaneous breathing, nonlabored ventilation, respiratory function stable and patient connected to nasal cannula oxygen Cardiovascular status: blood pressure returned to baseline and stable Postop Assessment: no apparent nausea or vomiting Anesthetic complications: no  No notable events documented.  Last Vitals:  Vitals:   10/05/22 1034 10/05/22 1115  BP: (!) 133/102 (!) 136/96  Pulse: 64 68  Resp: 19 12  Temp: 36.6 C   SpO2: 96% 94%    Last Pain:  Vitals:   10/05/22 1115  TempSrc:   PainSc: Asleep                 Nyasha Rahilly S

## 2022-10-05 NOTE — Interval H&P Note (Signed)
History and Physical Interval Note:  10/05/2022 11:11 AM  [name]  has presented today for surgery, with the diagnosis of AFIB.  The various methods of treatment have been discussed with the patient and family. After consideration of risks, benefits and other options for treatment, the patient has consented to  Procedure(s): CARDIOVERSION (N/A) as a surgical intervention.  The patient's history has been reviewed, patient examined, no change in status, stable for surgery.  I have reviewed the patient's chart and labs.  Questions were answered to the patient's satisfaction.     Samuel [name]

## 2022-10-05 NOTE — CV Procedure (Signed)
Procedure: Electrical Cardioversion Indications:  Atrial Fibrillation  Procedure Details:  Consent: Risks of procedure as well as the alternatives and risks of each were explained to the (patient/caregiver).  Consent for procedure obtained.  Time Out: Verified patient identification, verified procedure, site/side was marked, verified correct patient position, special equipment/implants available, medications/allergies/relevent history reviewed, required imaging and test results available. PERFORMED.  Patient placed on cardiac monitor, pulse oximetry, supplemental oxygen as necessary.  Sedation given:  100 mg lidocaine, 70 mg propofol Pacer pads placed anterior and posterior chest.  Cardioverted 2 time(s).  Cardioversion with synchronized biphasic 200J x2 shock.  Evaluation: Findings: Post procedure EKG shows: NSR with PACs Complications: None Patient did tolerate procedure well.  Time Spent Directly with the Patient:  15 minutes   Maisie Fus 10/05/2022, 11:11 AM

## 2022-10-05 NOTE — Discharge Instructions (Signed)

## 2022-10-11 ENCOUNTER — Ambulatory Visit: Payer: Medicare Other

## 2022-10-12 DIAGNOSIS — B351 Tinea unguium: Secondary | ICD-10-CM | POA: Diagnosis not present

## 2022-10-12 DIAGNOSIS — M79676 Pain in unspecified toe(s): Secondary | ICD-10-CM | POA: Diagnosis not present

## 2022-10-30 ENCOUNTER — Ambulatory Visit: Payer: PRIVATE HEALTH INSURANCE | Admitting: Nurse Practitioner

## 2022-11-03 ENCOUNTER — Other Ambulatory Visit: Payer: Self-pay | Admitting: Family Medicine

## 2022-11-03 DIAGNOSIS — M109 Gout, unspecified: Secondary | ICD-10-CM

## 2022-11-09 ENCOUNTER — Other Ambulatory Visit: Payer: Self-pay | Admitting: Family Medicine

## 2022-11-09 DIAGNOSIS — Z7901 Long term (current) use of anticoagulants: Secondary | ICD-10-CM

## 2022-11-09 DIAGNOSIS — M109 Gout, unspecified: Secondary | ICD-10-CM

## 2022-11-09 NOTE — Telephone Encounter (Signed)
Pt is compliant with warfarin management and PCP apts.  Sent in refill of warfarin to requested pharmacy.      

## 2022-11-10 ENCOUNTER — Encounter: Payer: Self-pay | Admitting: Cardiovascular Disease

## 2022-11-10 ENCOUNTER — Ambulatory Visit: Payer: Medicare Other | Attending: Cardiovascular Disease | Admitting: Cardiovascular Disease

## 2022-11-10 VITALS — BP 164/96 | HR 68 | Ht 73.0 in | Wt 237.6 lb

## 2022-11-10 DIAGNOSIS — I4819 Other persistent atrial fibrillation: Secondary | ICD-10-CM | POA: Diagnosis not present

## 2022-11-10 DIAGNOSIS — M5451 Vertebrogenic low back pain: Secondary | ICD-10-CM | POA: Diagnosis not present

## 2022-11-10 NOTE — Patient Instructions (Signed)
Medication Instructions:  Your physician recommends that you continue on your current medications as directed. Please refer to the Current Medication list given to you today. *If you need a refill on your cardiac medications before your next appointment, please call your pharmacy*   Follow-Up: At [facility], you and your health needs are our priority.  As part of our continuing mission to provide you with exceptional heart care, we have created designated Provider Care Teams.  These Care Teams include your primary Cardiologist (physician) and Advanced Practice Providers (APPs -  Physician Assistants and Nurse Practitioners) who all work together to provide you with the care you need, when you need it.  We recommend signing up for the patient portal called "MyChart".  Sign up information is provided on this After Visit Summary.  MyChart is used to connect with patients for Virtual Visits (Telemedicine).  Patients are able to view lab/test results, encounter notes, upcoming appointments, etc.  Non-urgent messages can be sent to your provider as well.   To learn more about what you can do with MyChart, go to [email or web address].    Your next appointment:   6 month(s)  Provider:   Augustus Mealor, MD  

## 2022-11-10 NOTE — Progress Notes (Signed)
Electrophysiology Office Note:    Date:  11/10/2022   ID:  Samuel [name], DOB [birth year], MRN [id number]  PCP:  Kristian Covey, MD   [facility] Providers Cardiologist:  Dietrich Pates, MD Electrophysiologist:  Maurice Small, MD     Referring MD: Kristian Covey, MD   History of Present Illness:    Samuel [name] is a 72 y.o. male with a hx listed below, significant for bicuspid aorta s/p saint Jude mechanical AVR and ascending aortic root replacement in 2010, TIA, hypothyroidism, chronic use of coumadin referred for arrhythmia management.  New onset AF diagnosed in January, 2024.  He noticed that his heart rates were very elevated while playing golf.  He has had slowly progressive fatigue over the past year.  He underwent DC cardioversion on October 05, 2022 and presents today for follow-up. He doesn't feel particularly different after the cardioversion. His fatigue is essentially unchanged.   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   Dyslipidemia    Gout    Hemorrhoids    HEMORRHOIDS-INTERNAL 12/20/2009   History of diverticulitis 03/2018   HYPERLIPIDEMIA-MIXED 04/01/2009   HYPOTHYROIDISM 08/26/2008   INSOMNIA, TRANSIENT 04/27/2009   Kidney stones    KNEE PAIN 11/02/2008   LATERAL EPICONDYLITIS 06/02/2009   Migraines    ONYCHOMYCOSIS 11/02/2008   PERSONAL HX COLONIC POLYPS 12/20/2009   Sudden visual loss 12/25/2008   TRANSIENT ISCHEMIC ATTACK 01/25/2009    Past Surgical History:  Procedure Laterality Date   CARDIAC VALVE REPLACEMENT     CARDIOVERSION N/A 10/05/2022   Procedure: CARDIOVERSION;  Surgeon: Maisie Fus, MD;  Location: [facility] INVASIVE CV LAB;  Service: Cardiovascular;  Laterality: N/A;   CYSTECTOMY     from abdominal wall   MYRINGOTOMY WITH TUBE PLACEMENT Left    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   TONSILLECTOMY      Current Medications: Current Meds  Medication Sig    allopurinol (ZYLOPRIM) 300 MG tablet TAKE 1 TABLET BY MOUTH EVERY DAY   amphetamine-dextroamphetamine (ADDERALL) 10 MG tablet Take 1 tablet (10 mg total) by mouth 2 (two) times daily. (Patient taking differently: Take 10 mg by mouth daily with breakfast.)   b complex vitamins capsule Take 1 capsule by mouth daily. Homocysteine Resit   levothyroxine (SYNTHROID) 137 MCG tablet TAKE 1 TABLET BY MOUTH EVERY DAY BEFORE BREAKFAST   metoprolol succinate (TOPROL XL) 25 MG 24 hr tablet Take 1 tablet (25 mg total) by mouth daily. (Patient taking differently: Take 25 mg by mouth at bedtime.)   oxyCODONE (OXY IR/ROXICODONE) 5 MG immediate release tablet Take 5 mg by mouth 2 (two) times daily as needed for severe pain or moderate pain.   warfarin (COUMADIN) 5 MG tablet TAKE 2 TABLETS BY MOUTH DAILY EXCEPT TAKE 2 1/2 TABLETS ON TUESDAYS AND SATURDAYS OR AS DIRECTED BY ANTICOAGULATION CLINIC     Allergies:   Shellfish allergy and Penicillins   Social and Family History: Reviewed in Epic  ROS:   Please see the history of present illness.    All other systems reviewed and are negative.  EKGs/Labs/Other Studies Reviewed Today:    Echocardiogram:  TTE 08/30/2022  1. Left ventricular ejection fraction, by estimation, is 55 to 60%. The  left ventricle has normal function. The left ventricle has no regional  wall motion abnormalities. There is mild concentric left ventricular  hypertrophy. Left ventricular  diastolic function could not be evaluated.   2. Right ventricular systolic function is low normal. The right  ventricular size is normal. There is normal pulmonary artery systolic  pressure. The estimated right ventricular systolic pressure is 24.2 mmHg.   3. Left atrial size was severely dilated.   4. Right atrial size was severely dilated.   5. The mitral valve is normal in structure. Trivial mitral valve  regurgitation. No evidence of mitral stenosis.   6. The aortic valve has been repaired/replaced.  Aortic valve  regurgitation is trivial. There is a mechanical valve present in the  aortic position. Procedure Date: 2010. Echo findings are consistent with  normal structure and function of the aortic valve  prosthesis. Aortic valve mean gradient measures 7.8 mmHg. Aortic valve Vmax measures 1.81 m/s. Aortic valve acceleration time measures 60 msec.   7. Aortic dilatation noted. There is borderline dilatation of the  ascending aorta, measuring 39 mm.   8. The inferior vena cava is dilated in size with >50% respiratory  variability, suggesting right atrial pressure of 8 mmHg.    Monitors:  ZioPatch 07/05/2022 100% AF HR 37-210 bpm, avg 72 5.5% PVCs Triggered events are associated with PVCs  Stress testing:   Advanced imaging:     EKG:  Last EKG results: today - sinus rhythm, PVCs   Recent Labs: 06/21/2022: Magnesium 2.3; Platelets 286.0; TSH 1.23 10/05/2022: BUN 19; Creatinine, Ser 0.80; Hemoglobin 15.0; Potassium 4.0; Sodium 141     Physical Exam:    VS:  BP (!) 164/96 (BP Location: Left Arm, Patient Position: Sitting, Cuff Size: Large)   Pulse 68   Ht 6\' 1"  (1.854 m)   Wt 237 lb 9.6 oz (107.8 kg)   SpO2 96%   BMI 31.35 kg/m     Wt Readings from Last 3 Encounters:  11/10/22 237 lb 9.6 oz (107.8 kg)  10/05/22 235 lb (106.6 kg)  09/22/22 235 lb 6.4 oz (106.8 kg)     GEN: Well nourished, well developed in no acute distress CARDIAC: RRR, no murmurs, rubs, gallops RESPIRATORY:  Normal work of breathing MUSCULOSKELETAL: no edema    ASSESSMENT & PLAN:    Atrial fibrillation Persistent Bilateral atria are severely dilated Continue warfarin for atrial fibrillation and presence of a mechanical aortic valve No significant change in symptoms after DCCV. Will not aggressively pursue rhythm control at this time -- antiarrhythmic drugs do not decrease risk of adverse outcomes, ablation would be more difficult/risky with presence of artificial mitral valve He would like  to hold metoprolol to see if his energy levels increase. I'm ok with this. He was instructed to restart it if he has recurrence of AF.  St. Jude mechanical aortic valve in place Continue warfarin        Medication Adjustments/Labs and Tests Ordered: Current medicines are reviewed at length with the patient today.  Concerns regarding medicines are outlined above.  No orders of the defined types were placed in this encounter.  No orders of the defined types were placed in this encounter.    Signed, Maurice Small, MD  11/10/2022 11:23 AM    [facility]

## 2022-11-15 ENCOUNTER — Ambulatory Visit: Payer: Medicare Other

## 2022-11-20 ENCOUNTER — Ambulatory Visit (INDEPENDENT_AMBULATORY_CARE_PROVIDER_SITE_OTHER): Payer: Medicare Other

## 2022-11-20 DIAGNOSIS — Z7901 Long term (current) use of anticoagulants: Secondary | ICD-10-CM | POA: Diagnosis not present

## 2022-11-20 LAB — POCT INR: INR: 2.8 (ref 2.0–3.0)

## 2022-11-20 NOTE — Patient Instructions (Addendum)
Pre visit review using our clinic review tool, if applicable. No additional management support is needed unless otherwise documented below in the visit note.  Continue 2 tablets daily except take 2 1/2 tablets on Tuesday and Saturday. Recheck in 6 weeks. 

## 2022-11-20 NOTE — Progress Notes (Signed)
Pt had cardioversion on 4/25.  Continue 2 tablets daily except take 2 1/2 tablets on Tuesday and Saturday. Recheck in 6 weeks.

## 2022-11-20 NOTE — Progress Notes (Unsigned)
Office Visit    Patient Name: Samuel [name] Date of Encounter: 11/20/2022  Primary Care Provider:  Kristian Covey, MD Primary Cardiologist:  Dietrich Pates, MD Primary Electrophysiologist: Maurice Small, MD   Past Medical History    Past Medical History:  Diagnosis Date   Aortic aneurysm [facility])    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   Dyslipidemia    Gout    Hemorrhoids    HEMORRHOIDS-INTERNAL 12/20/2009   History of diverticulitis 03/2018   HYPERLIPIDEMIA-MIXED 04/01/2009   HYPOTHYROIDISM 08/26/2008   INSOMNIA, TRANSIENT 04/27/2009   Kidney stones    KNEE PAIN 11/02/2008   LATERAL EPICONDYLITIS 06/02/2009   Migraines    ONYCHOMYCOSIS 11/02/2008   PERSONAL HX COLONIC POLYPS 12/20/2009   Sudden visual loss 12/25/2008   TRANSIENT ISCHEMIC ATTACK 01/25/2009   Past Surgical History:  Procedure Laterality Date   CARDIAC VALVE REPLACEMENT     CARDIOVERSION N/A 10/05/2022   Procedure: CARDIOVERSION;  Surgeon: Maisie Fus, MD;  Location: [facility];  Service: Cardiovascular;  Laterality: N/A;   CYSTECTOMY     from abdominal wall   MYRINGOTOMY WITH TUBE PLACEMENT Left    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   TONSILLECTOMY      Allergies  Allergies  Allergen Reactions   Shellfish Allergy Anaphylaxis   Penicillins     REACTION: childhood, reaction unknown     History of Present Illness    Samuel [name] is a 72 y.o. 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  He was initially seen in 2010 by Dr. Tenny Craw after suffering a TIA and found to have progression of aortic stenosis with mean gradient of 59 mmHg.  Carotid ultrasounds were completed that were negative. He underwent mechanical AVR replacement with ascending aortic root replacement.  He was started on Coumadin and has done well with no  palpitations or chest pain.  He was diagnosed with atrial fibrillation by EKG at PCP office and wore a 14-day ZIO with 100% AF burden.  He was started on metoprolol 25 mg and was referred to the AF clinic for further management.  He was seen in AF clinic on 08/22/2022 by Rudi Coco, NP and plan for cardioversion and referral was placed to EP for consideration of ablation.  He was seen by Dr. Nelly Laurence on 09/22/2022 and decision was made to undergo cardioversion and patient completed DCCV on 10/05/2022.  He was seen back on 11/10/2022 with no change to his fatigue and metoprolol was discontinued to see if energy level is increased.  The decision was also made to not aggressively pursue rhythm control.  Since last being seen in the office patient reports***.  Patient denies chest pain, palpitations, dyspnea, PND, orthopnea, nausea, vomiting, dizziness, syncope, edema, weight gain, or early satiety.     ***Notes:  Home Medications    Current Outpatient Medications  Medication Sig Dispense Refill   allopurinol (ZYLOPRIM) 300 MG tablet TAKE 1 TABLET BY MOUTH EVERY DAY 90 tablet 0   amphetamine-dextroamphetamine (ADDERALL) 10 MG tablet Take 1 tablet (10 mg total) by mouth 2 (two) times daily. (Patient taking differently: Take 10 mg by mouth daily with breakfast.) 60 tablet 0   amphetamine-dextroamphetamine (ADDERALL) 10 MG tablet Take 1 tablet (10 mg total) by mouth 2 (two) times daily. 60 tablet 0  amphetamine-dextroamphetamine (ADDERALL) 10 MG tablet Take 1 tablet (10 mg total) by mouth 2 (two) times daily. 60 tablet 0   b complex vitamins capsule Take 1 capsule by mouth daily. Homocysteine Resit     levothyroxine (SYNTHROID) 137 MCG tablet TAKE 1 TABLET BY MOUTH EVERY DAY BEFORE BREAKFAST 90 tablet 3   metoprolol succinate (TOPROL XL) 25 MG 24 hr tablet Take 1 tablet (25 mg total) by mouth daily. (Patient taking differently: Take 25 mg by mouth at bedtime.) 30 tablet 2   oxyCODONE (OXY IR/ROXICODONE) 5  MG immediate release tablet Take 5 mg by mouth 2 (two) times daily as needed for severe pain or moderate pain.     sildenafil (VIAGRA) 100 MG tablet Take 0.5-1 tablets (50-100 mg total) by mouth daily as needed for erectile dysfunction. (Patient not taking: Reported on 11/10/2022) 30 tablet 1   warfarin (COUMADIN) 5 MG tablet TAKE 2 TABLETS BY MOUTH DAILY EXCEPT TAKE 2 1/2 TABLETS ON TUESDAYS AND SATURDAYS OR AS DIRECTED BY ANTICOAGULATION CLINIC 210 tablet 1   No current facility-administered medications for this visit.     Review of Systems  Please see the history of present illness.    (+)*** (+)***  All other systems reviewed and are otherwise negative except as noted above.  Physical Exam    Wt Readings from Last 3 Encounters:  11/10/22 237 lb 9.6 oz (107.8 kg)  10/05/22 235 lb (106.6 kg)  09/22/22 235 lb 6.4 oz (106.8 kg)   WN:UUVOZ were no vitals filed for this visit.,There is no height or weight on file to calculate BMI.  Constitutional:      Appearance: Healthy appearance. Not in distress.  Neck:     Vascular: JVD normal.  Pulmonary:     Effort: Pulmonary effort is normal.     Breath sounds: No wheezing. No rales. Diminished in the bases Cardiovascular:     Normal rate. Regular rhythm. Normal S1. Normal S2.      Murmurs: There is no murmur.  Edema:    Peripheral edema absent.  Abdominal:     Palpations: Abdomen is soft non tender. There is no hepatomegaly.  Skin:    General: Skin is warm and dry.  Neurological:     General: No focal deficit present.     Mental Status: Alert and oriented to person, place and time.     Cranial Nerves: Cranial nerves are intact.  EKG/LABS/ Recent Cardiac Studies    ECG personally reviewed by me today - ***  Cardiac Studies & Procedures       ECHOCARDIOGRAM  ECHOCARDIOGRAM COMPLETE 08/30/2022  Narrative ECHOCARDIOGRAM REPORT    Patient Name:   Samuel [name] Date of Exam: 08/30/2022 Medical Rec #:  [id number]     Height:        74.0 in Accession #:    [id number]    Weight:       236.6 lb Date of Birth:  05-21-1951      BSA:          2.334 m Patient Age:    72 years      BP:           114/80 mmHg Patient Gender: M             HR:           62 bpm. Exam Location:  [address]  Procedure: 2D Echo, Cardiac Doppler and Color Doppler  Indications:    Z95.2 AVR I48.91 Atrial  Fibrillation  History:        Patient has prior history of Echocardiogram examinations, most recent 03/11/2021. Aortic valve replacement-St Jude Mechanical-02/2009.; Risk Factors:Dyslipidemia. Aortic Valve: mechanical valve is present in the aortic position. Procedure Date: 2010.  Sonographer:    Daphine Deutscher RDCS Referring Phys: [id number] Devoria Albe Laveda Demedeiros  IMPRESSIONS   1. Left ventricular ejection fraction, by estimation, is 55 to 60%. The left ventricle has normal function. The left ventricle has no regional wall motion abnormalities. There is mild concentric left ventricular hypertrophy. Left ventricular diastolic function could not be evaluated. 2. Right ventricular systolic function is low normal. The right ventricular size is normal. There is normal pulmonary artery systolic pressure. The estimated right ventricular systolic pressure is 24.2 mmHg. 3. Left atrial size was severely dilated. 4. Right atrial size was severely dilated. 5. The mitral valve is normal in structure. Trivial mitral valve regurgitation. No evidence of mitral stenosis. 6. The aortic valve has been repaired/replaced. Aortic valve regurgitation is trivial. There is a mechanical valve present in the aortic position. Procedure Date: 2010. Echo findings are consistent with normal structure and function of the aortic valve prosthesis. Aortic valve mean gradient measures 7.8 mmHg. Aortic valve Vmax measures 1.81 m/s. Aortic valve acceleration time measures 60 msec. 7. Aortic dilatation noted. There is borderline dilatation of the ascending aorta, measuring 39  mm. 8. The inferior vena cava is dilated in size with >50% respiratory variability, suggesting right atrial pressure of 8 mmHg.  FINDINGS Left Ventricle: Left ventricular ejection fraction, by estimation, is 55 to 60%. The left ventricle has normal function. The left ventricle has no regional wall motion abnormalities. The left ventricular internal cavity size was normal in size. There is mild concentric left ventricular hypertrophy. Abnormal (paradoxical) septal motion consistent with post-operative status. Left ventricular diastolic function could not be evaluated due to atrial fibrillation. Left ventricular diastolic function could not be evaluated.  Right Ventricle: The right ventricular size is normal. No increase in right ventricular wall thickness. Right ventricular systolic function is low normal. There is normal pulmonary artery systolic pressure. The tricuspid regurgitant velocity is 2.01 m/s, and with an assumed right atrial pressure of 8 mmHg, the estimated right ventricular systolic pressure is 24.2 mmHg.  Left Atrium: Left atrial size was severely dilated.  Right Atrium: Right atrial size was severely dilated.  Pericardium: There is no evidence of pericardial effusion.  Mitral Valve: The mitral valve is normal in structure. Mild mitral annular calcification. Trivial mitral valve regurgitation. No evidence of mitral valve stenosis.  Tricuspid Valve: The tricuspid valve is normal in structure. Tricuspid valve regurgitation is trivial.  Aortic Valve: The aortic valve has been repaired/replaced. Aortic valve regurgitation is trivial. Aortic valve mean gradient measures 7.8 mmHg. Aortic valve peak gradient measures 13.1 mmHg. There is a mechanical valve present in the aortic position. Procedure Date: 2010. Echo findings are consistent with normal structure and function of the aortic valve prosthesis.  Pulmonic Valve: The pulmonic valve was not well visualized. Pulmonic valve  regurgitation is not visualized. No evidence of pulmonic stenosis.  Aorta: Aortic dilatation noted. There is borderline dilatation of the ascending aorta, measuring 39 mm.  Venous: The inferior vena cava is dilated in size with greater than 50% respiratory variability, suggesting right atrial pressure of 8 mmHg.  IAS/Shunts: No atrial level shunt detected by color flow Doppler.   LEFT VENTRICLE PLAX 2D LVIDd:         5.00 cm LVIDs:  3.60 cm LV PW:         1.20 cm LV IVS:        1.20 cm   RIGHT VENTRICLE             IVC RV Basal diam:  4.70 cm     IVC diam: 2.10 cm RV S prime:     10.10 cm/s TAPSE (M-mode): 2.3 cm  LEFT ATRIUM             Index        RIGHT ATRIUM           Index LA diam:        5.75 cm 2.46 cm/m   RA Area:     23.50 cm LA Vol (A2C):   91.7 ml 39.28 ml/m  RA Volume:   78.10 ml  33.46 ml/m LA Vol (A4C):   73.0 ml 31.27 ml/m LA Biplane Vol: 85.1 ml 36.46 ml/m AORTIC VALVE AV Vmax:           181.00 cm/s AV Vmean:          128.800 cm/s AV VTI:            0.338 m AV Peak Grad:      13.1 mmHg AV Mean Grad:      7.8 mmHg LVOT Vmax:         87.80 cm/s LVOT Vmean:        57.900 cm/s LVOT VTI:          0.180 m LVOT/AV VTI ratio: 0.53  AORTA Ao Root diam: 3.90 cm Ao Asc diam:  3.90 cm  MV E velocity: 107.53 cm/s  TRICUSPID VALVE TR Peak grad:   16.2 mmHg TR Vmax:        201.00 cm/s  SHUNTS Systemic VTI: 0.18 m  Rachelle Hora Croitoru MD Electronically signed by Thurmon Fair MD Signature Date/Time: 08/30/2022/7:09:11 PM    Final    MONITORS  LONG TERM MONITOR (3-14 DAYS) 07/24/2022  Narrative Impression: Atrial fibrillation:  rates 37 to 210 bpm  Average HR 72 bpm Longest pause 3.1 seconds Frquent PVCs.   Short bursts V, fastest at 5 beats at 210 bpm   Longest 5 beats. Symptoms did not correlate with significant arrhythmia  ___________________________________________________________________________________ Patch Wear Time:  11 days and 2  hours (2024-01-24T11:55:08-498 to 2024-02-04T14:12:59-0500)  7 Ventricular Tachycardia runs occurred, the run with the fastest interval lasting 5 beats with a max rate of 210 bpm, the longest lasting 5 beats with an avg rate of 121 bpm. Atrial Fibrillation occurred continuously (100% burden), ranging from 37-158 bpm (avg of 72 bpm). 3 Pauses occurred, the longest lasting 3.1 secs (19 bpm). Isolated VEs were frequent (5.5%, 63535), VE Couplets were rare (<1.0%, 1599), and VE Triplets were rare (<1.0%, 165). Ventricular Bigeminy and Trigeminy were present.           Risk Assessment/Calculations:   {Does this patient have ATRIAL FIBRILLATION?:[id number]}  STOP-Bang Score:  5  { Consider Dx Sleep Disordered Breathing or Sleep Apnea  ICD G47.33          :1}      Lab Results  Component Value Date   WBC 6.9 06/21/2022   HGB 15.0 10/05/2022   HCT 44.0 10/05/2022   MCV 91.3 06/21/2022   PLT 286.0 06/21/2022   Lab Results  Component Value Date   CREATININE 0.80 10/05/2022   BUN 19 10/05/2022   NA 141 10/05/2022   K 4.0 10/05/2022  CL 100 10/05/2022   CO2 33 (H) 06/21/2022   Lab Results  Component Value Date   ALT 40 10/01/2020   AST 34 10/01/2020   ALKPHOS 62 10/01/2020   BILITOT 1.1 10/01/2020   Lab Results  Component Value Date   CHOL 171 07/26/2020   HDL 44 07/26/2020   LDLCALC 88 07/26/2020   LDLDIRECT 137.9 06/21/2012   TRIG 232 (H) 07/26/2020   CHOLHDL 3.9 07/26/2020    Lab Results  Component Value Date   HGBA1C  02/23/2009    5.8 (NOTE) The ADA recommends the following therapeutic goal for glycemic control related to Hgb A1c measurement: Goal of therapy: <6.5 Hgb A1c  Reference: American Diabetes Association: Clinical Practice Recommendations 2010, Diabetes Care, 2010, 33: (Suppl  1).     Assessment & Plan    1.  New onset atrial fibrillation: -Patient recently underwent DCCV and was instructed to hold metoprolol due to increased fatigue. -Today patient  reports*** -CHA2DS2-VASc Score = 2 [CHF History: 0, HTN History: 0, Diabetes History: 0, Stroke History: 0, Vascular Disease History: 1, Age Score: 1, Gender Score: 0].  Therefore, the patient's annual risk of stroke is 2.2 %.      2.  History of aortic stenosis with AVR repair: -s/p Saint Jude mechanical AVR repair in 2010 -SBE prophylaxis discussed -Continue Coumadin as managed by Coumadin clinic   3.  Mixed hyperlipidemia: -Patient's last LDL cholesterol was 88 slightly above goal of less than 70. -Patient was advised to continue lifestyle modifications   4.  Hypertrophic cardiomyopathy: -Patient's last 2D echo was completed 03/11/2021 showing moderate asymmetric left ventricular hypertrophy of the basal septal segment. -Blood pressures currently well-controlled and repeat 2D echo will be discussed at follow-up.   5.  Hypothyroidism: -Continue current treatment plan per PCP     Disposition: Follow-up with Dietrich Pates, MD or APP in *** months {Are you ordering a CV Procedure (e.g. stress test, cath, DCCV, TEE, etc)?   Press F2        :[id number]}   Medication Adjustments/Labs and Tests Ordered: Current medicines are reviewed at length with the patient today.  Concerns regarding medicines are outlined above.   Signed, Napoleon Form, Leodis Rains, NP 11/20/2022, 7:24 PM [facility]

## 2022-11-21 ENCOUNTER — Ambulatory Visit: Payer: Medicare Other | Attending: Nurse Practitioner | Admitting: Nurse Practitioner

## 2022-11-21 ENCOUNTER — Encounter: Payer: Self-pay | Admitting: Nurse Practitioner

## 2022-11-21 VITALS — BP 138/88 | HR 80 | Ht 73.0 in | Wt 236.8 lb

## 2022-11-21 DIAGNOSIS — E039 Hypothyroidism, unspecified: Secondary | ICD-10-CM | POA: Insufficient documentation

## 2022-11-21 DIAGNOSIS — Z952 Presence of prosthetic heart valve: Secondary | ICD-10-CM | POA: Diagnosis not present

## 2022-11-21 DIAGNOSIS — I4891 Unspecified atrial fibrillation: Secondary | ICD-10-CM | POA: Diagnosis not present

## 2022-11-21 DIAGNOSIS — R030 Elevated blood-pressure reading, without diagnosis of hypertension: Secondary | ICD-10-CM | POA: Diagnosis not present

## 2022-11-21 DIAGNOSIS — E782 Mixed hyperlipidemia: Secondary | ICD-10-CM | POA: Diagnosis not present

## 2022-11-21 DIAGNOSIS — I422 Other hypertrophic cardiomyopathy: Secondary | ICD-10-CM

## 2022-11-21 NOTE — Patient Instructions (Signed)
Medication Instructions:  You can hold Metoprolol after your vacation *If you need a refill on your cardiac medications before your next appointment, please call your pharmacy*   Lab Work: None Ordered If you have labs (blood work) drawn today and your tests are completely normal, you will receive your results only by: MyChart Message (if you have MyChart) OR A paper copy in the mail If you have any lab test that is abnormal or we need to change your treatment, we will call you to review the results.   Testing/Procedures: None ordered   Follow-Up: At [facility], you and your health needs are our priority.  As part of our continuing mission to provide you with exceptional heart care, we have created designated Provider Care Teams.  These Care Teams include your primary Cardiologist (physician) and Advanced Practice Providers (APPs -  Physician Assistants and Nurse Practitioners) who all work together to provide you with the care you need, when you need it.  We recommend signing up for the patient portal called "MyChart".  Sign up information is provided on this After Visit Summary.  MyChart is used to connect with patients for Virtual Visits (Telemedicine).  Patients are able to view lab/test results, encounter notes, upcoming appointments, etc.  Non-urgent messages can be sent to your provider as well.   To learn more about what you can do with MyChart, go to [email or web address].    Your next appointment:   4 month(s)  Provider:   Dietrich Pates, MD     Other Instructions Check your blood pressure daily for 2 weeks, then contact the office with your readings.

## 2022-11-23 ENCOUNTER — Other Ambulatory Visit: Payer: Self-pay | Admitting: Nurse Practitioner

## 2022-12-01 ENCOUNTER — Encounter: Payer: Self-pay | Admitting: Family Medicine

## 2022-12-02 MED ORDER — AMPHETAMINE-DEXTROAMPHETAMINE 10 MG PO TABS: 10.0000 mg | ORAL_TABLET | Freq: Two times a day (BID) | ORAL | 0 refills | Status: DC

## 2022-12-04 DIAGNOSIS — M5459 Other low back pain: Secondary | ICD-10-CM | POA: Diagnosis not present

## 2022-12-12 DIAGNOSIS — M5459 Other low back pain: Secondary | ICD-10-CM | POA: Diagnosis not present

## 2022-12-13 ENCOUNTER — Ambulatory Visit (INDEPENDENT_AMBULATORY_CARE_PROVIDER_SITE_OTHER): Payer: Medicare Other | Admitting: Family Medicine

## 2022-12-13 ENCOUNTER — Encounter: Payer: Self-pay | Admitting: Family Medicine

## 2022-12-13 VITALS — BP 144/90 | HR 75 | Temp 98.2°F | Ht 73.0 in | Wt 237.7 lb

## 2022-12-13 DIAGNOSIS — R030 Elevated blood-pressure reading, without diagnosis of hypertension: Secondary | ICD-10-CM

## 2022-12-13 DIAGNOSIS — R937 Abnormal findings on diagnostic imaging of other parts of musculoskeletal system: Secondary | ICD-10-CM

## 2022-12-13 NOTE — Progress Notes (Signed)
Established Patient Office Visit  Subjective   Patient ID: Samuel [name], male    DOB: 1951/03/25  Age: 72 y.o. MRN: [id number]  Chief Complaint  Patient presents with   Results    HPI   Samuel [name] has history of aortic valve replacement several years ago, atrial fibrillation, hypothyroidism, cervical and lumbar spondylosis, adult ADD, hyperlipidemia.  Here today to discuss several things  He had ongoing back pain.  Initially thought he may be getting surgery but did not recommend that recently.  They have consider repeat epidural injections which is complicated by the fact that he is on Coumadin.  He had recent MRI scan of which we have no record of currently which reportedly showed "bilateral cysts "in kidneys.  Is not aware of any family history of polycystic kidneys.  He states he had recent cardioversion procedure.  Since then his blood pressure has been up.  Couple of occasions been over 160 systolic but frequently over 140s.  He is currently not exercising regularly.  He thinks he could be doing some more walking now with his back settling down some.  Does drink about 4 ounces of vodka per day.  Tries to watch sodium intake.  Currently on Toprol XL 25 mg daily but no other blood pressure medications.  He does take low-dose Adderall.  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   Dyslipidemia    Gout    Hemorrhoids    HEMORRHOIDS-INTERNAL 12/20/2009   History of diverticulitis 03/2018   HYPERLIPIDEMIA-MIXED 04/01/2009   HYPOTHYROIDISM 08/26/2008   INSOMNIA, TRANSIENT 04/27/2009   Kidney stones    KNEE PAIN 11/02/2008   LATERAL EPICONDYLITIS 06/02/2009   Migraines    ONYCHOMYCOSIS 11/02/2008   PERSONAL HX COLONIC POLYPS 12/20/2009   Sudden visual loss 12/25/2008   TRANSIENT ISCHEMIC ATTACK 01/25/2009   Past Surgical History:  Procedure Laterality Date   CARDIAC VALVE REPLACEMENT     CARDIOVERSION N/A 10/05/2022   Procedure:  CARDIOVERSION;  Surgeon: Maisie Fus, MD;  Location: [facility];  Service: Cardiovascular;  Laterality: N/A;   CYSTECTOMY     from abdominal wall   MYRINGOTOMY WITH TUBE PLACEMENT Left    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 He has never used smokeless tobacco. He reports that he does not currently use alcohol. He reports that he does not use drugs. family history includes Diabetes in his mother; Heart disease in his father. Allergies  Allergen Reactions   Shellfish Allergy Anaphylaxis   Penicillins     REACTION: childhood, reaction unknown    Review of Systems  Constitutional:  Negative for malaise/fatigue and weight loss.  Eyes:  Negative for blurred vision.  Respiratory:  Negative for shortness of breath.   Cardiovascular:  Negative for chest pain.  Neurological:  Negative for dizziness, weakness and headaches.      Objective:     BP (!) 144/90 (BP Location: Left Arm, Patient Position: Sitting, Cuff Size: Normal)   Pulse 75   Temp 98.2 F (36.8 C) (Oral)   Ht 6\' 1"  (1.854 m)   Wt 237 lb 11.2 oz (107.8 kg)   SpO2 97%   BMI 31.36 kg/m  BP Readings from Last 3 Encounters:  12/13/22 (!) 144/90  11/21/22 138/88  11/10/22 (!) 164/96   Wt Readings from Last 3  Encounters:  12/13/22 237 lb 11.2 oz (107.8 kg)  11/21/22 236 lb 12.8 oz (107.4 kg)  11/10/22 237 lb 9.6 oz (107.8 kg)      Physical Exam Vitals reviewed.  Constitutional:      General: He is not in acute distress.    Appearance: Normal appearance. He is not ill-appearing.  Cardiovascular:     Rate and Rhythm: Normal rate and regular rhythm.     Comments: Prominent aortic click related to his prior aortic valve surgery Pulmonary:     Effort: Pulmonary effort is normal.     Breath sounds: Normal breath sounds. No wheezing or rales.  Musculoskeletal:     Right lower leg: No edema.     Left lower leg: No edema.  Neurological:      Mental Status: He is alert.      No results found for any visits on 12/13/22.  Last CBC Lab Results  Component Value Date   WBC 6.9 06/21/2022   HGB 15.0 10/05/2022   HCT 44.0 10/05/2022   MCV 91.3 06/21/2022   RDW 13.3 06/21/2022   PLT 286.0 06/21/2022   Last metabolic panel Lab Results  Component Value Date   GLUCOSE 109 (H) 10/05/2022   NA 141 10/05/2022   K 4.0 10/05/2022   CL 100 10/05/2022   CO2 33 (H) 06/21/2022   BUN 19 10/05/2022   CREATININE 0.80 10/05/2022   GFR 83.49 06/21/2022   CALCIUM 9.4 06/21/2022   PROT 7.0 10/01/2020   ALBUMIN 4.7 10/01/2020   BILITOT 1.1 10/01/2020   ALKPHOS 62 10/01/2020   AST 34 10/01/2020   ALT 40 10/01/2020   Last lipids Lab Results  Component Value Date   CHOL 171 07/26/2020   HDL 44 07/26/2020   LDLCALC 88 07/26/2020   LDLDIRECT 137.9 06/21/2012   TRIG 232 (H) 07/26/2020   CHOLHDL 3.9 07/26/2020   Last hemoglobin A1c Lab Results  Component Value Date   HGBA1C  02/23/2009    5.8 (NOTE) The ADA recommends the following therapeutic goal for glycemic control related to Hgb A1c measurement: Goal of therapy: <6.5 Hgb A1c  Reference: American Diabetes Association: Clinical Practice Recommendations 2010, Diabetes Care, 2010, 33: (Suppl  1).      The 10-year ASCVD risk score (Arnett DK, et al., 2019) is: 32%    Assessment & Plan:   #1 elevated blood pressure.  He had several recent elevated readings including here today.  We had a long discussion regarding various blood pressure medication options.  He prefers to try some lifestyle management first to see if we can get this down.  We discussed the following  -Stressed importance of weight loss and especially regular aerobic exercise -Stressed importance of low-sodium diet.  DASH Diet handout given -Discussed importance of limiting alcohol consumption -Challenged him to try to lose a few pounds which should help his blood pressure as well -Set up follow-up in a  couple months.  If not consistently less than 140 systolic at that point consider additional medication-possibly ARB  #2 reported bilateral renal cysts incidentally noted on recent MRI back.  We have asked that he try to get a copy of that MRI for [place] to review.  [name], MD

## 2022-12-13 NOTE — Patient Instructions (Signed)
Let's plan on 2 month follow up.   If BP up at that time will consider medication options  In the meantime, focus on exercise, weight loss, sodium reduction, alcohol reduction.

## 2023-01-01 ENCOUNTER — Ambulatory Visit (INDEPENDENT_AMBULATORY_CARE_PROVIDER_SITE_OTHER): Payer: Medicare Other

## 2023-01-01 DIAGNOSIS — Z7901 Long term (current) use of anticoagulants: Secondary | ICD-10-CM

## 2023-01-01 LAB — POCT INR: INR: 2.3 (ref 2.0–3.0)

## 2023-01-01 NOTE — Patient Instructions (Addendum)
Pre visit review using our clinic review tool, if applicable. No additional management support is needed unless otherwise documented below in the visit note.  Continue 2 tablets daily except take 2 1/2 tablets on Tuesday and Saturday. Recheck in 7 weeks.

## 2023-01-01 NOTE — Progress Notes (Signed)
Continue 2 tablets daily except take 2 1/2 tablets on Tuesday and Saturday. Recheck in 7 weeks.

## 2023-01-11 DIAGNOSIS — M79676 Pain in unspecified toe(s): Secondary | ICD-10-CM | POA: Diagnosis not present

## 2023-01-11 DIAGNOSIS — B351 Tinea unguium: Secondary | ICD-10-CM | POA: Diagnosis not present

## 2023-01-25 ENCOUNTER — Encounter (INDEPENDENT_AMBULATORY_CARE_PROVIDER_SITE_OTHER): Payer: Self-pay

## 2023-01-31 ENCOUNTER — Other Ambulatory Visit: Payer: Self-pay | Admitting: Family Medicine

## 2023-02-01 DIAGNOSIS — M5136 Other intervertebral disc degeneration, lumbar region: Secondary | ICD-10-CM | POA: Diagnosis not present

## 2023-02-01 DIAGNOSIS — M5416 Radiculopathy, lumbar region: Secondary | ICD-10-CM | POA: Diagnosis not present

## 2023-02-01 DIAGNOSIS — M5030 Other cervical disc degeneration, unspecified cervical region: Secondary | ICD-10-CM | POA: Diagnosis not present

## 2023-02-01 DIAGNOSIS — Z79899 Other long term (current) drug therapy: Secondary | ICD-10-CM | POA: Diagnosis not present

## 2023-02-01 DIAGNOSIS — G894 Chronic pain syndrome: Secondary | ICD-10-CM | POA: Diagnosis not present

## 2023-02-01 DIAGNOSIS — M47896 Other spondylosis, lumbar region: Secondary | ICD-10-CM | POA: Diagnosis not present

## 2023-02-01 DIAGNOSIS — Z5181 Encounter for therapeutic drug level monitoring: Secondary | ICD-10-CM | POA: Diagnosis not present

## 2023-02-01 DIAGNOSIS — M47812 Spondylosis without myelopathy or radiculopathy, cervical region: Secondary | ICD-10-CM | POA: Diagnosis not present

## 2023-02-01 DIAGNOSIS — M5412 Radiculopathy, cervical region: Secondary | ICD-10-CM | POA: Diagnosis not present

## 2023-02-01 MED ORDER — AMPHETAMINE-DEXTROAMPHETAMINE 10 MG PO TABS: 10.0000 mg | ORAL_TABLET | Freq: Two times a day (BID) | ORAL | 0 refills | Status: DC

## 2023-02-06 ENCOUNTER — Ambulatory Visit (INDEPENDENT_AMBULATORY_CARE_PROVIDER_SITE_OTHER): Payer: Medicare Other | Admitting: Family Medicine

## 2023-02-06 ENCOUNTER — Encounter: Payer: Self-pay | Admitting: Family Medicine

## 2023-02-06 VITALS — BP 138/78 | HR 70 | Temp 97.9°F | Ht 73.0 in | Wt 233.4 lb

## 2023-02-06 DIAGNOSIS — R229 Localized swelling, mass and lump, unspecified: Secondary | ICD-10-CM | POA: Diagnosis not present

## 2023-02-06 MED ORDER — TRIAMCINOLONE ACETONIDE 0.1 % EX CREA: 1.0000 | TOPICAL_CREAM | Freq: Two times a day (BID) | CUTANEOUS | 1 refills | Status: DC

## 2023-02-06 NOTE — Progress Notes (Signed)
Established Patient Office Visit  Subjective   Patient ID: Samuel [name], male    DOB: 1950/10/10  Age: 72 y.o. MRN: [id number]  Chief Complaint  Patient presents with   Rash    Patient complains of rash on right leg, x3 weeks     HPI   Samuel [name] has a couple of small erythematous nodules right lateral leg which he first noticed about 3 weeks ago.  He thought these may be related to some sort of bite.  Somewhat of a stinging quality.  He tried over-the-counter type of cream of some sort without much improvement.  No generalized rash.  No fever.  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  He is on chronic Coumadin.  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   Dyslipidemia    Gout    Hemorrhoids    HEMORRHOIDS-INTERNAL 12/20/2009   History of diverticulitis 03/2018   HYPERLIPIDEMIA-MIXED 04/01/2009   HYPOTHYROIDISM 08/26/2008   INSOMNIA, TRANSIENT 04/27/2009   Kidney stones    KNEE PAIN 11/02/2008   LATERAL EPICONDYLITIS 06/02/2009   Migraines    ONYCHOMYCOSIS 11/02/2008   PERSONAL HX COLONIC POLYPS 12/20/2009   Sudden visual loss 12/25/2008   TRANSIENT ISCHEMIC ATTACK 01/25/2009   Past Surgical History:  Procedure Laterality Date   CARDIAC VALVE REPLACEMENT     CARDIOVERSION N/A 10/05/2022   Procedure: CARDIOVERSION;  Surgeon: Maisie Fus, MD;  Location: [facility];  Service: Cardiovascular;  Laterality: N/A;   CYSTECTOMY     from abdominal wall   MYRINGOTOMY WITH TUBE PLACEMENT Left    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 He has never used smokeless tobacco. He reports that he does not currently use alcohol. He reports that he does not use drugs. family history includes Diabetes in his mother; Heart disease in his father. Allergies   Allergen Reactions   Shellfish Allergy Anaphylaxis   Penicillins     REACTION: childhood, reaction unknown    Review of Systems  Constitutional:  Negative for chills and fever.  Skin:  Positive for rash.      Objective:     BP 138/78 (BP Location: Left Arm, Cuff Size: Normal)   Pulse 70   Temp 97.9 F (36.6 C) (Oral)   Ht 6\' 1"  (1.854 m)   Wt 233 lb 6.4 oz (105.9 kg)   SpO2 98%   BMI 30.79 kg/m    Physical Exam Vitals reviewed.  Constitutional:      Appearance: Normal appearance.  Cardiovascular:     Rate and Rhythm: Normal rate and regular rhythm.  Pulmonary:     Effort: Pulmonary effort is normal.     Breath sounds: Normal breath sounds.  Skin:    Comments: He has a couple of small skin nodules approximately 9 to 10 mm diameter right lower lateral leg.  No pustular center.  No vesiculation.  Minimally tender to palpation.  Nonfluctuant  Neurological:     Mental Status: He is alert.      No results found for any visits on 02/06/23.    The 10-year ASCVD risk score (Arnett DK, et al., 2019) is: 26.2%    Assessment & Plan:   He has a couple small erythematous nodules right lower  leg.  Question reaction to some sort of bite.  He has some associated pruritus.  Recommend trial of triamcinolone 0.1% cream to use twice daily.  Be in touch if these are not improving over the next couple weeks   No follow-ups on file.    [name], MD

## 2023-02-09 ENCOUNTER — Other Ambulatory Visit: Payer: Self-pay | Admitting: Family Medicine

## 2023-02-09 DIAGNOSIS — Z7901 Long term (current) use of anticoagulants: Secondary | ICD-10-CM

## 2023-02-16 NOTE — Telephone Encounter (Signed)
Pt is compliant with warfarin management and PCP apts.  Sent in refill of warfarin to requested pharmacy.      

## 2023-02-19 ENCOUNTER — Ambulatory Visit: Payer: Medicare Other

## 2023-02-21 ENCOUNTER — Ambulatory Visit (INDEPENDENT_AMBULATORY_CARE_PROVIDER_SITE_OTHER): Payer: Medicare Other

## 2023-02-21 DIAGNOSIS — Z7901 Long term (current) use of anticoagulants: Secondary | ICD-10-CM | POA: Diagnosis not present

## 2023-02-21 LAB — POCT INR: INR: 3.6 — AB (ref 2.0–3.0)

## 2023-02-21 NOTE — Progress Notes (Signed)
Pt has had some recent stress due to trying to buy a house. Stress can increase INR.  Hold dose today and then change 2 tablets daily except take 2 1/2 tablets on Saturday. Recheck in 2 weeks.

## 2023-02-21 NOTE — Patient Instructions (Addendum)
Pre visit review using our clinic review tool, if applicable. No additional management support is needed unless otherwise documented below in the visit note.  Hold dose today and then change 2 tablets daily except take 2 1/2 tablets on Saturday. Recheck in 2 weeks.

## 2023-03-07 ENCOUNTER — Ambulatory Visit: Payer: Medicare Other

## 2023-03-14 ENCOUNTER — Ambulatory Visit (INDEPENDENT_AMBULATORY_CARE_PROVIDER_SITE_OTHER): Payer: Medicare Other

## 2023-03-14 DIAGNOSIS — Z7901 Long term (current) use of anticoagulants: Secondary | ICD-10-CM

## 2023-03-14 LAB — POCT INR: INR: 3.3 — AB (ref 2.0–3.0)

## 2023-03-14 NOTE — Patient Instructions (Addendum)
Pre visit review using our clinic review tool, if applicable. No additional management support is needed unless otherwise documented below in the visit note.  Hold dose today and then change weekly dosing to take 2 tablets daily. Recheck in 3 weeks.

## 2023-03-14 NOTE — Progress Notes (Signed)
Pt has not been eating salads he normally does in the last week.  Hold dose today and then change weekly dosing to take 2 tablets daily. Recheck in 3 weeks.  Pt refuses printed AVS and uses mychart for instructions.

## 2023-03-20 DIAGNOSIS — M79676 Pain in unspecified toe(s): Secondary | ICD-10-CM | POA: Diagnosis not present

## 2023-03-20 DIAGNOSIS — B351 Tinea unguium: Secondary | ICD-10-CM | POA: Diagnosis not present

## 2023-03-25 ENCOUNTER — Encounter: Payer: Self-pay | Admitting: Cardiovascular Disease

## 2023-03-26 ENCOUNTER — Ambulatory Visit: Payer: Medicare Other | Admitting: Family Medicine

## 2023-03-26 ENCOUNTER — Encounter: Payer: Self-pay | Admitting: Family Medicine

## 2023-03-26 VITALS — BP 150/70 | HR 70 | Temp 98.9°F | Resp 16 | Ht 73.0 in | Wt 226.5 lb

## 2023-03-26 DIAGNOSIS — R059 Cough, unspecified: Secondary | ICD-10-CM

## 2023-03-26 DIAGNOSIS — H9202 Otalgia, left ear: Secondary | ICD-10-CM | POA: Diagnosis not present

## 2023-03-26 DIAGNOSIS — H1033 Unspecified acute conjunctivitis, bilateral: Secondary | ICD-10-CM

## 2023-03-26 DIAGNOSIS — I10 Essential (primary) hypertension: Secondary | ICD-10-CM

## 2023-03-26 DIAGNOSIS — J069 Acute upper respiratory infection, unspecified: Secondary | ICD-10-CM | POA: Diagnosis not present

## 2023-03-26 LAB — POC COVID19 BINAXNOW: SARS Coronavirus 2 Ag: NEGATIVE

## 2023-03-26 MED ORDER — OLOPATADINE HCL 0.1 % OP SOLN
1.0000 [drp] | Freq: Two times a day (BID) | OPHTHALMIC | 0 refills | Status: DC
Start: 2023-03-26 — End: 2023-08-24

## 2023-03-26 MED ORDER — BENZONATATE 100 MG PO CAPS
200.0000 mg | ORAL_CAPSULE | Freq: Two times a day (BID) | ORAL | 0 refills | Status: AC | PRN
Start: 2023-03-26 — End: 2023-04-05

## 2023-03-26 NOTE — Progress Notes (Signed)
ACUTE VISIT Chief Complaint  Patient presents with   Cough    Started on Friday, became worse on Saturday & Sunday; using OTC cough syrup. Productive cough, no fever.    HPI: [name] is a 72 y.o. male atrial fibrillation on Coumadin, hypothyroidism, seasonal allergies, ADD, HLD, gout, insomnia, and chronic pain here today complaining of cough for 3 days.  She complains of a cough that began 10/11 and worsened over the weekend.   Cough This is a new problem. The current episode started in the past 7 days. The problem has been gradually worsening. The cough is Productive of sputum. Associated symptoms include ear congestion, ear pain, nasal congestion, postnasal drip and rhinorrhea. Pertinent negatives include no chest pain, eye redness, headaches, heartburn, hemoptysis, rash, shortness of breath, weight loss or wheezing. Nothing aggravates the symptoms. He has tried OTC cough suppressant for the symptoms. The treatment provided mild relief. His past medical history is significant for environmental allergies.   He says the cough is productive but denies blood in the sputum.  He cannot cough sputum up.  He endorses associated sore throat, chills, body aches, decrease in appetite, eye drainage and pruritus, and left ear drainage and pain. + Watery eyes.  He denies fever, wheezing, SOB, abdominal pain, nausea, vomiting, acid reflux, or hearing changes.   He notes his wife was sick for about 3 weeks recently.  He has a history of seasonal allergies.  He has been taking robitussin dm for his cough.   Hearing loss, he wears hearing aids. He sees audiology every few years.  Reports that he has left ear tube placement.  His blood pressure was slightly elevated today. He says he does not normally check it at home.  He is on Metoprolol Succinate 25 mg daily  Review of Systems  Constitutional:  Positive for appetite change and fatigue. Negative for activity change and weight loss.   HENT:  Positive for ear pain, postnasal drip and rhinorrhea.   Eyes:  Positive for discharge. Negative for pain, redness and visual disturbance.  Respiratory:  Positive for cough. Negative for hemoptysis, shortness of breath and wheezing.   Cardiovascular:  Negative for chest pain.  Gastrointestinal:  Negative for abdominal pain, heartburn, nausea and vomiting.  Genitourinary:  Negative for decreased urine volume, dysuria and hematuria.  Skin:  Negative for rash.  Allergic/Immunologic: Positive for environmental allergies.  Neurological:  Negative for syncope and headaches.  See other pertinent positives and negatives in HPI.  Current Outpatient Medications on File Prior to Visit  Medication Sig Dispense Refill   allopurinol (ZYLOPRIM) 300 MG tablet TAKE 1 TABLET BY MOUTH EVERY DAY 90 tablet 0   amphetamine-dextroamphetamine (ADDERALL) 10 MG tablet Take 1 tablet (10 mg total) by mouth 2 (two) times daily. 60 tablet 0   amphetamine-dextroamphetamine (ADDERALL) 10 MG tablet Take 1 tablet (10 mg total) by mouth 2 (two) times daily. 60 tablet 0   amphetamine-dextroamphetamine (ADDERALL) 10 MG tablet Take 1 tablet (10 mg total) by mouth 2 (two) times daily. 60 tablet 0   b complex vitamins capsule Take 1 capsule by mouth daily. Homocysteine Resit     levothyroxine (SYNTHROID) 137 MCG tablet TAKE 1 TABLET BY MOUTH EVERY DAY BEFORE BREAKFAST 90 tablet 3   metoprolol succinate (TOPROL-XL) 25 MG 24 hr tablet TAKE 1 TABLET (25 MG TOTAL) BY MOUTH DAILY. 90 tablet 3   oxyCODONE (OXY IR/ROXICODONE) 5 MG immediate release tablet Take 5 mg by mouth 2 (two) times  daily as needed for severe pain or moderate pain.     sildenafil (VIAGRA) 100 MG tablet Take 0.5-1 tablets (50-100 mg total) by mouth daily as needed for erectile dysfunction. 30 tablet 1   triamcinolone cream (KENALOG) 0.1 % Apply 1 Application topically 2 (two) times daily. 30 g 1   warfarin (COUMADIN) 5 MG tablet TAKE 2 TABLETS BY MOUTH DAILY  EXCEPT TAKE 2.5 TABLETS ON TUESDAYS AND SATURDAYS OR AS DIRECTED BY ANTICOAGULATION CLINIC 210 tablet 1   No current facility-administered medications on file prior to visit.    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   Dyslipidemia    Gout    Hemorrhoids    HEMORRHOIDS-INTERNAL 12/20/2009   History of diverticulitis 03/2018   HYPERLIPIDEMIA-MIXED 04/01/2009   HYPOTHYROIDISM 08/26/2008   INSOMNIA, TRANSIENT 04/27/2009   Kidney stones    KNEE PAIN 11/02/2008   LATERAL EPICONDYLITIS 06/02/2009   Migraines    ONYCHOMYCOSIS 11/02/2008   PERSONAL HX COLONIC POLYPS 12/20/2009   Sudden visual loss 12/25/2008   TRANSIENT ISCHEMIC ATTACK 01/25/2009   Allergies  Allergen Reactions   Shellfish Allergy Anaphylaxis   Penicillins     REACTION: childhood, reaction unknown    Social History   Socioeconomic History   Marital status: Married    Spouse name: Not on file   Number of children: 3   Years of education: Not on file   Highest education level: Not on file  Occupational History   Not on file  Tobacco Use   Smoking status: Former   Smokeless tobacco: Never   Tobacco comments:    Quit smoking 1994  Vaping Use   Vaping status: Never Used  Substance and Sexual Activity   Alcohol use: Not Currently    Comment: pt states he hasnt really drank this year   Drug use: No   Sexual activity: Not on file  Other Topics Concern   Not on file  Social History Narrative   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   Social Determinants of Health   Financial Resource Strain: Low Risk  (12/28/2020)   Overall Financial Resource Strain (CARDIA)    Difficulty of Paying Living Expenses: Not hard at all  Food Insecurity: No Food Insecurity (03/25/2021)   Hunger Vital Sign    Worried About Radiation protection practitioner of Food in the Last Year: Never true    Ran Out of Food in the Last Year: Never true  Transportation Needs: No  Transportation Needs (03/25/2021)   PRAPARE - Administrator, Civil Service (Medical): No    Lack of Transportation (Non-Medical): No  Physical Activity: Insufficiently Active (03/25/2021)   Exercise Vital Sign    Days of Exercise per Week: 3 days    Minutes of Exercise per Session: 40 min  Stress: No Stress Concern Present (03/25/2021)   [employer] of Occupational Health - Occupational Stress Questionnaire    Feeling of Stress : Only a little  Social Connections: Unknown (10/25/2021)   Received from [facility]   Social Network    Social Network: Not on file    Vitals:   03/26/23 1133 03/26/23 1152  BP: (!) 146/80 (!) 150/70  Pulse: 70   Resp: 16   Temp: 98.9 F (37.2 C)   SpO2: 99%    Body mass index is 29.88 kg/m.  Physical Exam Vitals and nursing note reviewed.  Constitutional:  General: He is not in acute distress.    Appearance: He is well-developed.  HENT:     Head: Normocephalic and atraumatic.     Right Ear: Tympanic membrane, ear canal and external ear normal.     Left Ear: External ear normal. No PE tube. Tympanic membrane is not injected or erythematous.     Ears:     Comments: Left ear canal with no erythema, no drainage appreciated, ? Small TM perforation. I do not appreciate tube in ear canal.    Nose: Congestion and rhinorrhea present.     Mouth/Throat:     Mouth: Mucous membranes are moist.  Eyes:     Conjunctiva/sclera: Conjunctivae normal.     Right eye: Right conjunctiva is not injected. No exudate or hemorrhage.    Left eye: Left conjunctiva is not injected. No exudate or hemorrhage.    Comments: Eye lid edges with scaly areas and minimal erythema, no edema, L>R.  Cardiovascular:     Rate and Rhythm: Normal rate and regular rhythm.     Heart sounds: No murmur heard.    Comments:   Pulmonary:     Effort: Pulmonary effort is normal. No respiratory distress.     Breath sounds: Normal breath sounds.   Lymphadenopathy:     Cervical: No cervical adenopathy.  Skin:    General: Skin is warm.     Findings: No erythema or rash.  Neurological:     General: No focal deficit present.     Mental Status: He is alert and oriented to person, place, and time.     Gait: Gait normal.  Psychiatric:        Mood and Affect: Mood and affect normal.   ASSESSMENT AND PLAN:  Mr. Gharibian was seen today for cough.    URI, acute Symptoms suggests a viral etiology, I explained patient that symptomatic treatment is recommended in this case, I do not think abx is needed at this time. COVID 19 test negative today. Systemic steroids, which he is requesting, will not help to shorten symptoms and not appropriate at this time.  Instructed to monitor for signs of complications, including new onset of fever among some, clearly instructed about warning signs.  F/U as needed.  Cough, unspecified type X 2-3 days, lung auscultation negative today. I do not think CXR is needed at this time. I also explained that cough and nasal congestion can last a few days and sometimes weeks.  -     POC COVID-19 BinaxNow -     Benzonatate; Take 2 capsules (200 mg total) by mouth 2 (two) times daily as needed for up to 10 days.  Dispense: 40 capsule; Refill: 0  Primary hypertension BP mildly elevated today. Recommend monitoring BP at home. Continue Metoprolol succinate 25 mg daily. Avoid OTC cold medications.  Acute conjunctivitis of both eyes, unspecified acute conjunctivitis type Vs viral. Symptoms are not suggestive of bacterial conjunctivitis. Recommend trying Olopatadine eye drops.  -     Olopatadine HCl; Place 1 drop into both eyes 2 (two) times daily.  Dispense: 5 mL; Refill: 0  Earache, left With associated clear drainage. On examination no TM erythema or drainage, no tube appreciated. Question of small TM perforation, reports no changes in hearing. Explained that most TM perforation can heal w/o surgical  intervention.  Recommend keeping next appt with PCP. Instructed about warning signs.  Return if symptoms worsen or fail to improve, for keep next appointment.  IRolla Etienne [name], acting  as a scribe for Trisha Morandi [place], MD., have documented all relevant documentation on the behalf of Samuel [name] [place], MD, as directed by  Samuel [name] [place], MD while in the presence of Samuel [name] [place], MD.   I, Samuel [name] [place], MD, have reviewed all documentation for this visit. The documentation on 03/26/23 for the exam, diagnosis, procedures, and orders are all accurate and complete.  Samuel Maggard G. [place], MD  [facility]. [place] office.

## 2023-03-26 NOTE — Patient Instructions (Addendum)
A few things to remember from today's visit:  URI, acute  Cough, unspecified type - Plan: POC COVID-19, benzonatate (TESSALON) 100 MG capsule  Primary hypertension  Acute conjunctivitis of both eyes, unspecified acute conjunctivitis type - Plan: olopatadine (PATANOL) 0.1 % ophthalmic solution  viral infections are self-limited and we treat each symptom depending of severity.  Over the counter medications as decongestants and cold medications usually help, they need to be taken with caution if there is a history of high blood pressure or palpitations. Tylenol and/or Ibuprofen also helps with most symptoms (headache, muscle aching, fever,etc) Plenty of fluids. Honey helps with cough. Steam inhalations helps with runny nose, nasal congestion, and may prevent sinus infections. Cough and nasal congestion could last a few days and sometimes weeks. Please follow in not any better in 1-2 weeks or if symptoms get worse.  Have your ears checked by your audiologist or PCP next visit for possible ear drum perforation, left. Monitor blood pressure at home. Avoid cold meds.  Do not use My Chart to request refills or for acute issues that need immediate attention. If you send a my chart message, it may take a few days to be addressed, specially if I am not in the office.  Please be sure medication list is accurate. If a new problem present, please set up appointment sooner than planned today.

## 2023-03-27 ENCOUNTER — Ambulatory Visit: Payer: Medicare Other | Admitting: Internal Medicine

## 2023-03-28 ENCOUNTER — Encounter: Payer: Self-pay | Admitting: Family Medicine

## 2023-03-28 ENCOUNTER — Ambulatory Visit (INDEPENDENT_AMBULATORY_CARE_PROVIDER_SITE_OTHER): Payer: Medicare Other | Admitting: Family Medicine

## 2023-03-28 VITALS — BP 170/70 | HR 100 | Temp 99.5°F | Ht 73.0 in | Wt 224.2 lb

## 2023-03-28 DIAGNOSIS — R059 Cough, unspecified: Secondary | ICD-10-CM

## 2023-03-28 DIAGNOSIS — H109 Unspecified conjunctivitis: Secondary | ICD-10-CM

## 2023-03-28 DIAGNOSIS — R030 Elevated blood-pressure reading, without diagnosis of hypertension: Secondary | ICD-10-CM | POA: Diagnosis not present

## 2023-03-28 DIAGNOSIS — H7292 Unspecified perforation of tympanic membrane, left ear: Secondary | ICD-10-CM | POA: Diagnosis not present

## 2023-03-28 MED ORDER — HYDROCODONE BIT-HOMATROP MBR 5-1.5 MG/5ML PO SOLN: 5.0000 mL | Freq: Three times a day (TID) | ORAL | 0 refills | Status: DC | PRN

## 2023-03-28 MED ORDER — POLYMYXIN B-TRIMETHOPRIM 10000-0.1 UNIT/ML-% OP SOLN: 2.0000 [drp] | OPHTHALMIC | 0 refills | Status: DC

## 2023-03-28 MED ORDER — DOXYCYCLINE HYCLATE 100 MG PO CAPS
100.0000 mg | ORAL_CAPSULE | Freq: Two times a day (BID) | ORAL | 0 refills | Status: DC
Start: 2023-03-28 — End: 2023-04-13

## 2023-03-28 MED ORDER — OFLOXACIN 0.3 % OT SOLN: 10.0000 [drp] | Freq: Two times a day (BID) | OTIC | 0 refills | Status: DC

## 2023-03-28 NOTE — Progress Notes (Signed)
Established Patient Office Visit  Subjective   Patient ID: Samuel [name], male    DOB: 01-13-1951  Age: 72 y.o. MRN: [id number]  Chief Complaint  Patient presents with   Cough    Patient complains of cough, x5 days   Chills    Patient complains of chills. X5 days     HPI   Samuel [name] is seen today with multiple symptoms as below.  He has chronic problems including history of atrial fibrillation, prior aortic valve replacement on chronic Coumadin, hypothyroidism, hyperlipidemia.  He was seen here October 14 with predominant symptom of cough. Was felt to have viral process.  Prescribed Tessalon which has not helped much with cough.  Cough interfering with sleep.  He had COVID test then which was negative and again negative today.  Also did home COVID test last week which also came back negative.  Past history of tympanostomy tubes.  He relates some yellowish drainage left ear canal past several days.  Reportedly normal exam couple days ago.  Intermittent left ear pain.  Initially had some more clear watery eye discharge but now thick matting and he states his eyes were "glued shut "early morning.  Cough has been productive.  He had some chills.  No thermometer to check temperature.  Allergy to penicillin.  Denies any dyspnea.  Is on chronic Coumadin secondary to aortic valve replacement and compliant with therapy there.  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   Dyslipidemia    Gout    Hemorrhoids    HEMORRHOIDS-INTERNAL 12/20/2009   History of diverticulitis 03/2018   HYPERLIPIDEMIA-MIXED 04/01/2009   HYPOTHYROIDISM 08/26/2008   INSOMNIA, TRANSIENT 04/27/2009   Kidney stones    KNEE PAIN 11/02/2008   LATERAL EPICONDYLITIS 06/02/2009   Migraines    ONYCHOMYCOSIS 11/02/2008   PERSONAL HX COLONIC POLYPS 12/20/2009   Sudden visual loss 12/25/2008   TRANSIENT ISCHEMIC ATTACK 01/25/2009   Past Surgical History:  Procedure Laterality  Date   CARDIAC VALVE REPLACEMENT     CARDIOVERSION N/A 10/05/2022   Procedure: CARDIOVERSION;  Surgeon: Maisie Fus, MD;  Location: [facility];  Service: Cardiovascular;  Laterality: N/A;   CYSTECTOMY     from abdominal wall   MYRINGOTOMY WITH TUBE PLACEMENT Left    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 He has never used smokeless tobacco. He reports that he does not currently use alcohol. He reports that he does not use drugs. family history includes Diabetes in his mother; Heart disease in his father. Allergies  Allergen Reactions   Shellfish Allergy Anaphylaxis   Penicillins     REACTION: childhood, reaction unknown    Review of Systems  Constitutional:  Positive for chills and weight loss.  HENT:  Positive for ear discharge.   Eyes:  Positive for discharge and redness.  Respiratory:  Positive for cough and sputum production. Negative for hemoptysis and shortness of breath.   Cardiovascular:  Negative for chest pain.      Objective:     BP (!) 170/70 (BP Location: Left Arm, Patient Position: Sitting, Cuff Size: Normal)   Pulse 100   Temp 99.5 F (37.5 C) (Oral)   Ht 6\' 1"  (1.854 m)   Wt 224 lb 3.2 oz (101.7 kg)   SpO2 98%   BMI 29.58 kg/m  BP  Readings from Last 3 Encounters:  03/28/23 (!) 170/70  03/26/23 (!) 150/70  02/06/23 138/78   Wt Readings from Last 3 Encounters:  03/28/23 224 lb 3.2 oz (101.7 kg)  03/26/23 226 lb 8 oz (102.7 kg)  02/06/23 233 lb 6.4 oz (105.9 kg)      Physical Exam Vitals reviewed.  Constitutional:      General: He is not in acute distress.    Appearance: Normal appearance.  HENT:     Head: Normocephalic.     Ears:     Comments: Right eardrum is normal with normal landmarks.  Left reveals fairly large perforation about 6 x 8 mm near the central zone.  There is some yellowish discharge in the canal. Eyes:     Comments: Conjunctive erythematous  bilaterally.  Cardiovascular:     Rate and Rhythm: Normal rate.  Pulmonary:     Effort: Pulmonary effort is normal.     Breath sounds: Normal breath sounds. No wheezing or rales.  Musculoskeletal:     Cervical back: Neck supple.  Lymphadenopathy:     Cervical: No cervical adenopathy.  Neurological:     Mental Status: He is alert.      No results found for any visits on 03/28/23.  Last CBC Lab Results  Component Value Date   WBC 6.9 06/21/2022   HGB 15.0 10/05/2022   HCT 44.0 10/05/2022   MCV 91.3 06/21/2022   RDW 13.3 06/21/2022   PLT 286.0 06/21/2022   Last metabolic panel Lab Results  Component Value Date   GLUCOSE 109 (H) 10/05/2022   NA 141 10/05/2022   K 4.0 10/05/2022   CL 100 10/05/2022   CO2 33 (H) 06/21/2022   BUN 19 10/05/2022   CREATININE 0.80 10/05/2022   GFR 83.49 06/21/2022   CALCIUM 9.4 06/21/2022   PROT 7.0 10/01/2020   ALBUMIN 4.7 10/01/2020   BILITOT 1.1 10/01/2020   ALKPHOS 62 10/01/2020   AST 34 10/01/2020   ALT 40 10/01/2020   Last lipids Lab Results  Component Value Date   CHOL 171 07/26/2020   HDL 44 07/26/2020   LDLCALC 88 07/26/2020   LDLDIRECT 137.9 06/21/2012   TRIG 232 (H) 07/26/2020   CHOLHDL 3.9 07/26/2020   Last hemoglobin A1c Lab Results  Component Value Date   HGBA1C  02/23/2009    5.8 (NOTE) The ADA recommends the following therapeutic goal for glycemic control related to Hgb A1c measurement: Goal of therapy: <6.5 Hgb A1c  Reference: American Diabetes Association: Clinical Practice Recommendations 2010, Diabetes Care, 2010, 33: (Suppl  1).      The 10-year ASCVD risk score (Arnett DK, et al., 2019) is: 35.8%    Assessment & Plan:   #1 probable bacterial conjunctivitis bilaterally.  Symptoms have progressed from 2 days ago.  Recommend warm compresses.  Start Polytrim ophthalmic drops every 4 hours while awake.  Reassess in 1 week  #2 perforation left TM.  Fairly large perforation visible eardrum.  He has some  serous to purulent drainage in the ear canal which is fairly minimal.  We recommended keeping water out as much as possible.  Ofloxacin eardrops 10 drops left ear twice daily and reassess in 1 week.  May need ENT follow-up.  Patient had planned to go to [place] in a month and do some scuba diving and will likely have to put that on hold  #3 cough.  May still be viral.  He feels like his symptoms are worsening.  We decided to  go and cover with doxycycline 100 mg twice daily for 7 days.  Continue close follow-up with Coumadin clinic.  He is getting follow-up INR in 1 week.  Cough severe at night and not relieved with Tessalon.  Sent in limited Hycodan cough syrup 1 teaspoon nightly.  #4 elevated blood pressure.  This is atypical for him.  Repeat after rest came down considerably but still not normal.  Reassess at follow-up in 1 week.  If still up at that point consider additional medication  [name], MD

## 2023-03-29 ENCOUNTER — Encounter: Payer: Self-pay | Admitting: Family Medicine

## 2023-04-01 ENCOUNTER — Other Ambulatory Visit: Payer: Self-pay | Admitting: Family Medicine

## 2023-04-01 DIAGNOSIS — M109 Gout, unspecified: Secondary | ICD-10-CM

## 2023-04-04 ENCOUNTER — Ambulatory Visit (INDEPENDENT_AMBULATORY_CARE_PROVIDER_SITE_OTHER): Payer: Medicare Other

## 2023-04-04 DIAGNOSIS — Z7901 Long term (current) use of anticoagulants: Secondary | ICD-10-CM

## 2023-04-04 LAB — POCT INR: INR: 2.4 (ref 2.0–3.0)

## 2023-04-04 NOTE — Progress Notes (Signed)
Pt was prescribed benzonatate on 10/14 and then on 10/16 he was prescribed doxycycline, hycodan and ear drops, Ofloxacin. Pt reported he was prescribed clindamycin but it was doxycycline. This does interact with warfarin, increasing INR. Pt is in range today and has finished abx yesterday. Will not change warfarin dosing.  Continue 2 tablets daily. Recheck in 3 weeks per pt request due to going out of town. Pt refuses printed AVS and uses mychart for instructions.

## 2023-04-04 NOTE — Patient Instructions (Addendum)
Pre visit review using our clinic review tool, if applicable. No additional management support is needed unless otherwise documented below in the visit note.  Continue 2 tablets daily. Recheck in 3 weeks.

## 2023-04-10 ENCOUNTER — Telehealth: Payer: Self-pay | Admitting: Family Medicine

## 2023-04-10 MED ORDER — HYDROCODONE BIT-HOMATROP MBR 5-1.5 MG/5ML PO SOLN: 5.0000 mL | Freq: Three times a day (TID) | ORAL | 0 refills | Status: DC | PRN

## 2023-04-10 NOTE — Telephone Encounter (Signed)
Pt was just seen on 03/28/23. Pt states he is still coughing and has already finished the  HYDROcodone bit-homatropine (HYCODAN) 5-1.5 MG/5ML syrup  Pt would like to request a refill, if possible.  Pt was also scheduled for a F/U for Friday, 04/13/23... If MD deems necessary.  Please advise.

## 2023-04-10 NOTE — Telephone Encounter (Signed)
Refilled cough medication once.  Keep follow-up as scheduled  Kristian Covey MD [facility] at [facility]

## 2023-04-11 DIAGNOSIS — H7292 Unspecified perforation of tympanic membrane, left ear: Secondary | ICD-10-CM | POA: Diagnosis not present

## 2023-04-11 NOTE — Telephone Encounter (Signed)
Patient informed of the message below and voiced understanding  

## 2023-04-13 ENCOUNTER — Ambulatory Visit (INDEPENDENT_AMBULATORY_CARE_PROVIDER_SITE_OTHER): Payer: Medicare Other | Admitting: Family Medicine

## 2023-04-13 ENCOUNTER — Encounter: Payer: Self-pay | Admitting: Family Medicine

## 2023-04-13 VITALS — BP 142/70 | HR 71 | Temp 98.5°F | Ht 73.0 in | Wt 227.3 lb

## 2023-04-13 DIAGNOSIS — R030 Elevated blood-pressure reading, without diagnosis of hypertension: Secondary | ICD-10-CM

## 2023-04-13 DIAGNOSIS — R059 Cough, unspecified: Secondary | ICD-10-CM | POA: Diagnosis not present

## 2023-04-13 NOTE — Patient Instructions (Signed)
Consider home BP monitor ([employer] brand should be OK).  Get arm and not wrist monitor.

## 2023-04-13 NOTE — Progress Notes (Unsigned)
Established Patient Office Visit  Subjective   Patient ID: Samuel [name], male    DOB: [birth year]  Age: 72 y.o. MRN: [id number]  No chief complaint on file.   HPI  {History (Optional):23778} Samuel [name] is seen for medical follow-up.  He was seen here recently with several issues including bacterial conjunctivitis, perforated left TM, and cough.  Conjunctivitis symptoms of clear.  His cough is much better now after second course of cough medication.  No fever.  No productive cough.  No dyspnea or wheezing.  We referred to ENT and they are proposing surgery for his left TM.  He has been on some antibiotic drops and trying to keep water out of the ears much as possible.  Elevated blood pressure last visit.  Does not have a blood pressure monitor at home.  No recent headaches.  He has history of aortic valve replacement on chronic Coumadin.    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   Dyslipidemia    Gout    Hemorrhoids    HEMORRHOIDS-INTERNAL 12/20/2009   History of diverticulitis 03/2018   HYPERLIPIDEMIA-MIXED 04/01/2009   HYPOTHYROIDISM 08/26/2008   INSOMNIA, TRANSIENT 04/27/2009   Kidney stones    KNEE PAIN 11/02/2008   LATERAL EPICONDYLITIS 06/02/2009   Migraines    ONYCHOMYCOSIS 11/02/2008   PERSONAL HX COLONIC POLYPS 12/20/2009   Sudden visual loss 12/25/2008   TRANSIENT ISCHEMIC ATTACK 01/25/2009   Past Surgical History:  Procedure Laterality Date   CARDIAC VALVE REPLACEMENT     CARDIOVERSION N/A 10/05/2022   Procedure: CARDIOVERSION;  Surgeon: Maisie Fus, MD;  Location: [facility] INVAS[facility];  Service: Cardiovascular;  Laterality: N/A;   CYSTECTOMY     from abdominal wall   MYRINGOTOMY WITH TUBE PLACEMENT Left    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 He has never used smokeless tobacco. He reports that he does not currently use alcohol. He reports that  he does not use drugs. family history includes Diabetes in his mother; Heart disease in his father. Allergies  Allergen Reactions   Shellfish Allergy Anaphylaxis   Penicillins     REACTION: childhood, reaction unknown    Review of Systems  Constitutional:  Negative for chills, fever and malaise/fatigue.  Eyes:  Negative for blurred vision.  Respiratory:  Positive for cough. Negative for shortness of breath.   Cardiovascular:  Negative for chest pain.  Neurological:  Negative for dizziness, weakness and headaches.      Objective:     BP (!) 142/70 (BP Location: Left Arm, Cuff Size: Normal)   Pulse 71   Temp 98.5 F (36.9 C) (Oral)   Ht 6\' 1"  (1.854 m)   Wt 227 lb 4.8 oz (103.1 kg)   SpO2 99%   BMI 29.99 kg/m  BP Readings from Last 3 Encounters:  04/13/23 (!) 142/70  03/30/23 (!) 170/70  03/26/23 (!) 150/70   Wt Readings from Last 3 Encounters:  04/13/23 227 lb 4.8 oz (103.1 kg)  03/28/23 224 lb 3.2 oz (101.7 kg)  03/26/23 226 lb 8 oz (102.7 kg)      Physical Exam Vitals reviewed.  Constitutional:      Appearance: He is well-developed.  HENT:     Ears:     Comments: Fairly large left TM perforation.  No drainage noted at this time.  Eyes:     Pupils: Pupils are equal, round, and reactive to light.  Neck:     Thyroid: No thyromegaly.  Cardiovascular:     Rate and Rhythm: Normal rate and regular rhythm.  Pulmonary:     Effort: Pulmonary effort is normal. No respiratory distress.     Breath sounds: Normal breath sounds. No wheezing or rales.  Musculoskeletal:     Cervical back: Neck supple.  Neurological:     Mental Status: He is alert and oriented to person, place, and time.      No results found for any visits on 04/13/23.  Last CBC Lab Results  Component Value Date   WBC 6.9 06/21/2022   HGB 15.0 10/05/2022   HCT 44.0 10/05/2022   MCV 91.3 06/21/2022   RDW 13.3 06/21/2022   PLT 286.0 06/21/2022   Last metabolic panel Lab Results  Component  Value Date   GLUCOSE 109 (H) 10/05/2022   NA 141 10/05/2022   K 4.0 10/05/2022   CL 100 10/05/2022   CO2 33 (H) 06/21/2022   BUN 19 10/05/2022   CREATININE 0.80 10/05/2022   GFR 83.49 06/21/2022   CALCIUM 9.4 06/21/2022   PROT 7.0 10/01/2020   ALBUMIN 4.7 10/01/2020   BILITOT 1.1 10/01/2020   ALKPHOS 62 10/01/2020   AST 34 10/01/2020   ALT 40 10/01/2020   Last lipids Lab Results  Component Value Date   CHOL 171 07/26/2020   HDL 44 07/26/2020   LDLCALC 88 07/26/2020   LDLDIRECT 137.9 06/21/2012   TRIG 232 (H) 07/26/2020   CHOLHDL 3.9 07/26/2020      The 10-year ASCVD risk score (Arnett DK, et al., 2019) is: 27.4%    Assessment & Plan:   #1 cough.  Probably related to recent acute bronchitis.  Improved.  Clear lung exam.  Follow-up for any fever or increased shortness of breath  #2 elevated blood pressure.  Repeat after rest 142/70.  Recommend close home monitoring.  Be in touch if consistently greater than 140/90.   [name], MD

## 2023-04-15 ENCOUNTER — Other Ambulatory Visit: Payer: Self-pay | Admitting: Family Medicine

## 2023-04-16 ENCOUNTER — Encounter: Payer: Self-pay | Admitting: Family Medicine

## 2023-04-16 MED ORDER — POLYMYXIN B-TRIMETHOPRIM 10000-0.1 UNIT/ML-% OP SOLN: 2.0000 [drp] | OPHTHALMIC | 0 refills | Status: DC

## 2023-04-16 NOTE — Progress Notes (Deleted)
Cardiology Office Note:    Date:  04/16/2023   ID:  Samuel [name], DOB Apr 22, 1951, MRN [id number]  PCP:  Kristian Covey, MD  Cardiologist:  Dietrich Pates, MD Electrophysiologist:  Maurice Small, MD { Click to update primary MD,subspecialty MD or APP then REFRESH:1}    Referring MD: Kristian Covey, MD   Chief Complaint: follow-up of atrial fibrillation  History of Present Illness:    [name] is a 72 y.o. 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 Tenny Craw and Dr. Nelly Laurence and presents today for routine follow-up of atrial fibrillation.  Patient has a history of a bicuspid aortic valve with severe aortic stenosis and ascending aortic aneurysm.  He underwent a mechanical AVR with root replacement in 2010.  He has been on anticoagulation with Coumadin since that time.  More recently, he was found to have atrial fibrillation in early 2024.  Outpatient monitor in 06/2022 showed 100% atrial fibrillation with average rate of 72 bpm as well as short runs of NSVT and frequent PVCs.  Echo in 08/2022 showed LVEF of 55-60% with normal wall motion and mild LVH, low normal RV function, severe biatrial enlargement, normal structure and function of mechanical aortic valve prosthesis with mean gradient of 7.8 mmHg, and borderline dilatation of the ascending aorta measuring 39 mm.  He was referred to EP and ultimately underwent successful DCCV in 09/2022 with restoration of sinus rhythm.  He was last seen by Robin Searing, NP, in 11/2022 at which time he was doing well from a cardiac standpoint.  Patient presents today for follow-up. ***  Persistent Atrial Fibrillation Diagnosed in 06/2022. Monitor at that time showed 100%  atrial fibrillation with average ventricular rate of 72 bpm as well as short runs of NSVT and frequent PVCs.  Echo showed normal LV function and severe biatrial enlargement.  He underwent successful DCCV in 09/2022 with restoration of sinus rhythm. -Maintaining sinus rhythm on exam. -Continue Toprol XL 25 mg daily. -Continue chronic anticoagulation with Coumadin.  Followed in our Coumadin clinic.  Bicuspid Aortic Valve s/p Mechanical AVR Patient has a history of a bicuspid aortic valve with severe aortic stenosis and ascending aortic aneurysm s/p St. Jude mechanical AVR with ascending aortic root replacement in 2010.  Most recent echo in 08/2022 showed normal structure and function of mechanical aortic valve prosthesis with mean gradient of 7.8 mmHg. -Continue Coumadin. -Continue SBE prophylaxis prior to dental procedures.  Hypertension BP well controlled. *** - Continue Toprol-XL as above.  Hyperlipidemia History of hyperlipidemia listed in chart but he does not appear to be on any medications. Last lipid panel in ***: LDL goal <70 given TIA.  - ***  EKGs/Labs/Other Studies Reviewed:    The following studies were reviewed:  Monitor 07/05/2022 to 07/16/2022: 7 Ventricular Tachycardia runs occurred, the run with the fastest interval lasting 5 beats with a max rate of 210 bpm, the longest lasting 5 beats with an avg rate of 121 bpm. Atrial Fibrillation occurred continuously (100% burden), ranging from 37-158 bpm (avg of 72 bpm). 3 Pauses occurred, the longest lasting 3.1 secs (19 bpm). Isolated VEs were frequent (5.5%, 63535), VE Couplets were rare (<1.0%, 1599), and VE Triplets were rare (<1.0%, 165). Ventricular Bigeminy and  Trigeminy were present.   Impression: Atrial fibrillation:  rates 37 to 210 bpm  Average HR 72 bpm Longest pause 3.1 seconds Frquent PVCs.   Short bursts V, fastest at 5 beats at 210 bpm   Longest 5 beats. Symptoms did not correlate with significant  arrhythmia _______________  Echocardiogram 08/30/2022: Impressions: 1. Left ventricular ejection fraction, by estimation, is 55 to 60%. The  left ventricle has normal function. The left ventricle has no regional  wall motion abnormalities. There is mild concentric left ventricular  hypertrophy. Left ventricular diastolic  function could not be evaluated.   2. Right ventricular systolic function is low normal. The right  ventricular size is normal. There is normal pulmonary artery systolic  pressure. The estimated right ventricular systolic pressure is 24.2 mmHg.   3. Left atrial size was severely dilated.   4. Right atrial size was severely dilated.   5. The mitral valve is normal in structure. Trivial mitral valve  regurgitation. No evidence of mitral stenosis.   6. The aortic valve has been repaired/replaced. Aortic valve  regurgitation is trivial. There is a mechanical valve present in the  aortic position. Procedure Date: 2010. Echo findings are consistent with  normal structure and function of the aortic valve  prosthesis. Aortic valve mean gradient measures 7.8 mmHg. Aortic valve  Vmax measures 1.81 m/s. Aortic valve acceleration time measures 60 msec.   7. Aortic dilatation noted. There is borderline dilatation of the  ascending aorta, measuring 39 mm.   8. The inferior vena cava is dilated in size with >50% respiratory  variability, suggesting right atrial pressure of 8 mmHg.    EKG:  EKG not ordered today.   Recent Labs: 06/21/2022: Magnesium 2.3; Platelets 286.0; TSH 1.23 10/05/2022: BUN 19; Creatinine, Ser 0.80; Hemoglobin 15.0; Potassium 4.0; Sodium 141  Recent Lipid Panel    Component Value Date/Time   CHOL 171 07/26/2020 1650   TRIG 232 (H) 07/26/2020 1650   HDL 44 07/26/2020 1650   CHOLHDL 3.9 07/26/2020 1650   CHOLHDL 5 11/11/2014 0923   VLDL 19.2 11/11/2014 0923   LDLCALC 88 07/26/2020 1650   LDLDIRECT 137.9 06/21/2012 0851    Physical Exam:    Vital  Signs: There were no vitals taken for this visit.    Wt Readings from Last 3 Encounters:  04/13/23 227 lb 4.8 oz (103.1 kg)  03/28/23 224 lb 3.2 oz (101.7 kg)  03/26/23 226 lb 8 oz (102.7 kg)     General: 72 y.o. male in no acute distress. HEENT: Normocephalic and atraumatic. Sclera clear.  Neck: Supple. No carotid bruits. No JVD. Heart: *** RRR. Distinct S1 and S2. No murmurs, gallops, or rubs.  Lungs: No increased work of breathing. Clear to ausculation bilaterally. No wheezes, rhonchi, or rales.  Abdomen: Soft, non-distended, and non-tender to palpation.  Extremities: No lower extremity edema.  Radial and distal pedal pulses 2+ and equal bilaterally. Skin: Warm and dry. Neuro: No focal deficits. Psych: Normal affect. Responds appropriately.   Assessment:    No diagnosis found.  Plan:     Disposition: Follow up in ***   Signed, Corrin Parker, PA-C  04/16/2023 1:19 PM    [facility]

## 2023-04-17 MED ORDER — AMPHETAMINE-DEXTROAMPHETAMINE 10 MG PO TABS: 10.0000 mg | ORAL_TABLET | Freq: Two times a day (BID) | ORAL | 0 refills | Status: DC

## 2023-04-19 ENCOUNTER — Encounter: Payer: Self-pay | Admitting: Family Medicine

## 2023-04-19 ENCOUNTER — Ambulatory Visit: Payer: Medicare Other | Admitting: Family Medicine

## 2023-04-19 VITALS — BP 142/70 | HR 70 | Temp 98.4°F | Wt 228.0 lb

## 2023-04-19 DIAGNOSIS — H1033 Unspecified acute conjunctivitis, bilateral: Secondary | ICD-10-CM | POA: Diagnosis not present

## 2023-04-19 MED ORDER — TOBRAMYCIN-DEXAMETHASONE 0.3-0.1 % OP SUSP: 2.0000 [drp] | OPHTHALMIC | 0 refills | Status: DC

## 2023-04-19 NOTE — Progress Notes (Signed)
   Subjective:    Patient ID: Samuel [name], male    DOB: 1951-01-22, 72 y.o.   MRN: [id number]  HPI Here to follow up on conjunctivitis. He saw Dr. Caryl Never on 03-28-23 for a cough, ear pain, and burning itchy eyes. He was treated with 7 days of Doxycycline, Ofloxacin ear drops, and Polytrim eye drops. He was also noted to have a left TM perforation. He then saw Dr. Caryl Never on 04-13-23 and he felt much better. The ear pain was gone, his eyes felt fine, and the cough had improved quite a bit. He was referred to [facility], and he saw Scot Jun PA on 04-11-23. He was started on Ciprodex ear drops, and they began plans to repair the TM. Then yesterday [name]'s eyes flared up again with burning and itching. His lids were stuck together when he woke up yesterday morning. He does not wear contact lenses.    Review of Systems  Constitutional: Negative.   HENT:  Negative for congestion, ear pain, postnasal drip, sinus pain and sore throat.   Eyes:  Positive for discharge, redness and itching. Negative for photophobia and visual disturbance.  Respiratory:  Positive for cough. Negative for shortness of breath and wheezing.        Objective:   Physical Exam Constitutional:      Appearance: Normal appearance. He is not ill-appearing.  HENT:     Nose: Nose normal.     Mouth/Throat:     Pharynx: Oropharynx is clear.  Eyes:     Comments: Both conjunctivae are pink, no DC is seen   Pulmonary:     Effort: Pulmonary effort is normal.     Breath sounds: Normal breath sounds.  Neurological:     Mental Status: He is alert.           Assessment & Plan:  His bronchitis seems to be resolved. He will follow up with ENT for her ears. The conjunctivitis has flared up again however, so we will treat this with Tobradex drops. Follow up as needed. He leaves on a trip to [place] on 05-01-23.  Gershon Crane, MD

## 2023-04-24 ENCOUNTER — Ambulatory Visit (INDEPENDENT_AMBULATORY_CARE_PROVIDER_SITE_OTHER): Payer: Medicare Other | Admitting: Cardiovascular Disease

## 2023-04-24 DIAGNOSIS — I4819 Other persistent atrial fibrillation: Secondary | ICD-10-CM

## 2023-04-25 ENCOUNTER — Ambulatory Visit (INDEPENDENT_AMBULATORY_CARE_PROVIDER_SITE_OTHER): Payer: Medicare Other

## 2023-04-25 DIAGNOSIS — Z7901 Long term (current) use of anticoagulants: Secondary | ICD-10-CM | POA: Diagnosis not present

## 2023-04-25 LAB — POCT INR: INR: 4.3 — AB (ref 2.0–3.0)

## 2023-04-25 NOTE — Progress Notes (Signed)
Hold dose today and then reduce dose tomorrow to take 1 tablet and then change weekly dose to take 2 tablets daily except take 1 tablet on Mondays and Thursdays. Recheck in 4 weeks, per pt request due to going out of town. Pt refuses printed AVS and uses mychart for instructions. Pt denies any changes to cause supratherapeutic INR. Pt denies any s/s of abnormal bruising or bleeding. Advised if any s/s to go to ER. Pt verbalized understanding.

## 2023-04-25 NOTE — Patient Instructions (Addendum)
Pre visit review using our clinic review tool, if applicable. No additional management support is needed unless otherwise documented below in the visit note.  Hold dose today and then reduce dose tomorrow to take 1 tablet and then change weekly dose to take 2 tablets daily except take 1 tablet on Mondays and Thursdays. Recheck in 4 weeks

## 2023-04-26 ENCOUNTER — Ambulatory Visit: Payer: Medicare Other | Attending: Internal Medicine | Admitting: Student

## 2023-04-30 NOTE — Progress Notes (Signed)
Appointment canceled.

## 2023-05-22 DIAGNOSIS — H7292 Unspecified perforation of tympanic membrane, left ear: Secondary | ICD-10-CM | POA: Diagnosis not present

## 2023-05-22 DIAGNOSIS — H9113 Presbycusis, bilateral: Secondary | ICD-10-CM | POA: Diagnosis not present

## 2023-05-23 ENCOUNTER — Ambulatory Visit: Payer: Medicare Other

## 2023-05-25 ENCOUNTER — Other Ambulatory Visit: Payer: Self-pay | Admitting: Family Medicine

## 2023-05-25 MED ORDER — AMPHETAMINE-DEXTROAMPHETAMINE 10 MG PO TABS: 10.0000 mg | ORAL_TABLET | Freq: Two times a day (BID) | ORAL | 0 refills | Status: DC

## 2023-05-28 ENCOUNTER — Ambulatory Visit (INDEPENDENT_AMBULATORY_CARE_PROVIDER_SITE_OTHER): Payer: Medicare Other

## 2023-05-28 DIAGNOSIS — Z7901 Long term (current) use of anticoagulants: Secondary | ICD-10-CM | POA: Diagnosis not present

## 2023-05-28 LAB — POCT INR: INR: 2.3 (ref 2.0–3.0)

## 2023-05-28 NOTE — Patient Instructions (Addendum)
Pre visit review using our clinic review tool, if applicable. No additional management support is needed unless otherwise documented below in the visit note.  Continue 2 tablets daily except take 1 tablet on Mondays and Thursdays. Recheck in 6 weeks.

## 2023-05-28 NOTE — Progress Notes (Signed)
Continue 2 tablets daily except take 1 tablet on Mondays and Thursdays. Recheck in 6 weeks. Pt refuses printed AVS and uses mychart for instructions.

## 2023-05-29 ENCOUNTER — Emergency Department (HOSPITAL_COMMUNITY)
Admission: EM | Admit: 2023-05-29 | Discharge: 2023-05-29 | Discharge status: Against Medical Advice | Payer: Medicare Other | Attending: Emergency Medicine | Admitting: Emergency Medicine

## 2023-05-29 ENCOUNTER — Emergency Department (HOSPITAL_COMMUNITY): Payer: Medicare Other

## 2023-05-29 ENCOUNTER — Other Ambulatory Visit: Payer: Self-pay

## 2023-05-29 ENCOUNTER — Encounter (HOSPITAL_COMMUNITY): Payer: Self-pay | Admitting: *Deleted

## 2023-05-29 DIAGNOSIS — R079 Chest pain, unspecified: Secondary | ICD-10-CM | POA: Insufficient documentation

## 2023-05-29 DIAGNOSIS — R42 Dizziness and giddiness: Secondary | ICD-10-CM | POA: Insufficient documentation

## 2023-05-29 DIAGNOSIS — Z5321 Procedure and treatment not carried out due to patient leaving prior to being seen by health care provider: Secondary | ICD-10-CM | POA: Diagnosis not present

## 2023-05-29 LAB — CBC
HCT: 46.2 % (ref 39.0–52.0)
Hemoglobin: 15.9 g/dL (ref 13.0–17.0)
MCH: 32.1 pg (ref 26.0–34.0)
MCHC: 34.4 g/dL (ref 30.0–36.0)
MCV: 93.3 fL (ref 80.0–100.0)
Platelets: 188 10*3/uL (ref 150–400)
RBC: 4.95 MIL/uL (ref 4.22–5.81)
RDW: 13.2 % (ref 11.5–15.5)
WBC: 4.6 10*3/uL (ref 4.0–10.5)
nRBC: 0 % (ref 0.0–0.2)

## 2023-05-29 LAB — BASIC METABOLIC PANEL WITH GFR
Anion gap: 6 (ref 5–15)
BUN: 13 mg/dL (ref 8–23)
CO2: 27 mmol/L (ref 22–32)
Calcium: 9 mg/dL (ref 8.9–10.3)
Chloride: 102 mmol/L (ref 98–111)
Creatinine, Ser: 0.77 mg/dL (ref 0.61–1.24)
GFR, Estimated: >60 mL/min
Glucose, Bld: 117 mg/dL — ABNORMAL HIGH (ref 70–99)
Potassium: 4.7 mmol/L (ref 3.5–5.1)
Sodium: 135 mmol/L (ref 135–145)

## 2023-05-29 LAB — TROPONIN I (HIGH SENSITIVITY)
Troponin I (High Sensitivity): 7 ng/L (ref <18)
Troponin I (High Sensitivity): 8 ng/L (ref <18)

## 2023-05-29 NOTE — ED Triage Notes (Signed)
Pt woke up this am with chest pain that radiates to his back  Pt has hx of aortic valve replacement and is being followed by cardiology  Pt only c/o some dizziness with the pain

## 2023-06-18 DIAGNOSIS — M5030 Other cervical disc degeneration, unspecified cervical region: Secondary | ICD-10-CM | POA: Diagnosis not present

## 2023-06-18 DIAGNOSIS — G894 Chronic pain syndrome: Secondary | ICD-10-CM | POA: Diagnosis not present

## 2023-06-18 DIAGNOSIS — M47896 Other spondylosis, lumbar region: Secondary | ICD-10-CM | POA: Diagnosis not present

## 2023-06-18 DIAGNOSIS — M51362 Other intervertebral disc degeneration, lumbar region with discogenic back pain and lower extremity pain: Secondary | ICD-10-CM | POA: Diagnosis not present

## 2023-06-18 DIAGNOSIS — M5416 Radiculopathy, lumbar region: Secondary | ICD-10-CM | POA: Diagnosis not present

## 2023-06-18 DIAGNOSIS — M5412 Radiculopathy, cervical region: Secondary | ICD-10-CM | POA: Diagnosis not present

## 2023-06-18 DIAGNOSIS — M47812 Spondylosis without myelopathy or radiculopathy, cervical region: Secondary | ICD-10-CM | POA: Diagnosis not present

## 2023-06-25 ENCOUNTER — Other Ambulatory Visit: Payer: Self-pay | Admitting: Family Medicine

## 2023-06-25 ENCOUNTER — Encounter: Payer: Self-pay | Admitting: Family Medicine

## 2023-06-25 MED ORDER — SILDENAFIL CITRATE 100 MG PO TABS: 50.0000 mg | ORAL_TABLET | Freq: Every day | ORAL | 1 refills | Status: AC | PRN

## 2023-06-27 MED ORDER — LEVOTHYROXINE SODIUM 137 MCG PO TABS: ORAL_TABLET | ORAL | 0 refills | Status: DC

## 2023-07-02 ENCOUNTER — Ambulatory Visit: Payer: Medicare Other

## 2023-07-09 ENCOUNTER — Ambulatory Visit (INDEPENDENT_AMBULATORY_CARE_PROVIDER_SITE_OTHER): Payer: Medicare Other

## 2023-07-09 DIAGNOSIS — Z7901 Long term (current) use of anticoagulants: Secondary | ICD-10-CM | POA: Diagnosis not present

## 2023-07-09 LAB — POCT INR: INR: 2.4 (ref 2.0–3.0)

## 2023-07-09 NOTE — Patient Instructions (Addendum)
Pre visit review using our clinic review tool, if applicable. No additional management support is needed unless otherwise documented below in the visit note.  Continue 2 tablets daily except take 1 tablet on Mondays and Thursdays. Recheck in 6 weeks.

## 2023-07-09 NOTE — Progress Notes (Signed)
Continue 2 tablets daily except take 1 tablet on Mondays and Thursdays. Recheck in 6 weeks. Pt refuses printed AVS and uses mychart for instructions.

## 2023-07-12 DIAGNOSIS — B351 Tinea unguium: Secondary | ICD-10-CM | POA: Diagnosis not present

## 2023-07-26 ENCOUNTER — Other Ambulatory Visit: Payer: Self-pay | Admitting: Family Medicine

## 2023-07-26 DIAGNOSIS — M109 Gout, unspecified: Secondary | ICD-10-CM

## 2023-08-01 DIAGNOSIS — M25652 Stiffness of left hip, not elsewhere classified: Secondary | ICD-10-CM | POA: Diagnosis not present

## 2023-08-01 DIAGNOSIS — M6281 Muscle weakness (generalized): Secondary | ICD-10-CM | POA: Diagnosis not present

## 2023-08-01 DIAGNOSIS — M545 Low back pain, unspecified: Secondary | ICD-10-CM | POA: Diagnosis not present

## 2023-08-10 ENCOUNTER — Telehealth: Payer: Self-pay

## 2023-08-10 NOTE — Telephone Encounter (Signed)
 Copied from CRM [id number]. Topic: Clinical - Medical Advice >> Aug 10, 2023  3:11 PM [place] E wrote: Reason for CRM: Patient called in stating that is wife tested positive for COVID and he was wondering if there is anything he can do in order to not catch it. He is not symptomatic and he relayed to agent that he is keeping his distance and washing his hands. Patient wants to know if he should be doing anything else. >> Aug 10, 2023  3:30 PM Isabell A wrote: Patient will self test to check if he has Covid and will communicate with Dr.Burchette through MyChart.

## 2023-08-10 NOTE — Telephone Encounter (Signed)
 Copied from CRM [id number]. Topic: Clinical - Medical Advice >> Aug 10, 2023  3:11 PM [place] E wrote: Reason for CRM: Patient called in stating that is wife tested positive for COVID and he was wondering if there is anything he can do in order to not catch it. He is not symptomatic and he relayed to agent that he is keeping his distance and washing his hands. Patient wants to know if he should be doing anything else.

## 2023-08-10 NOTE — Telephone Encounter (Signed)
 Patient informed of the message below.

## 2023-08-20 ENCOUNTER — Ambulatory Visit: Payer: Medicare Other

## 2023-08-21 ENCOUNTER — Telehealth: Payer: Self-pay

## 2023-08-21 NOTE — Telephone Encounter (Signed)
 Pt NS coumadin clinic apt yesterday. Contacted pt and he reports he forgot about the apt. He reports he had COVID 11 days ago.  Advised ok to come to office now. RS coumadin clinic apt for tomorrow at [facility]. Pt verbalized understanding.

## 2023-08-22 ENCOUNTER — Ambulatory Visit (INDEPENDENT_AMBULATORY_CARE_PROVIDER_SITE_OTHER)

## 2023-08-22 DIAGNOSIS — Z7901 Long term (current) use of anticoagulants: Secondary | ICD-10-CM

## 2023-08-22 LAB — POCT INR: INR: 2.6 (ref 2.0–3.0)

## 2023-08-22 NOTE — Progress Notes (Signed)
 Pt reports he had COVID 11 days ago. Nothing was prescribed. Pt reported he has been taking 1 tablet on Tuesday and Friday and not Monday and Thursday. Updated calendar to reflect this. Continue 2 tablets daily except take 1 tablet on Tuesday and Friday. Recheck in 6 weeks. Pt refuses printed AVS and uses mychart for instructions.

## 2023-08-22 NOTE — Patient Instructions (Addendum)
 Pre visit review using our clinic review tool, if applicable. No additional management support is needed unless otherwise documented below in the visit note.  Continue 2 tablets daily except take 1 tablet on Tuesday and Friday. Recheck in 6 weeks.

## 2023-08-23 ENCOUNTER — Ambulatory Visit: Payer: Self-pay | Admitting: Family Medicine

## 2023-08-23 NOTE — Telephone Encounter (Signed)
 Chief Complaint: itching Symptoms: itching to hands and feet, redness Frequency: "couple days" but states this is an intermittent, ongoing problem Pertinent Negatives: Patient denies fever, swelling, SOB, CP, weakness, wounds, bleeding Disposition: [] ED /[] Urgent Care (no appt availability in office) / [x] Appointment(In office/virtual)/ []  [facility]/ [] Home Care/ [] Refused Recommended Disposition /[] [facility]/ []  Follow-up with PCP Additional Notes: Pt endorses 5/10 itching to his hands and feet and thighs. States there is splotchy redness there but no rash or raised skin. No blisters, no pustules, no drainage, no signs of infection. No fever, no swelling, no airway compromise or CP. Pt states he has had this problem many times before. Does not know what is causing it, states it might be the pollen. Is using hydrocortisone cream at home. Denies scratching to the point of bleeding. Per protocol pt scheduled tomorrow in the office at 1000. Pt agreeable. RN advised pt if he develops swelling or has difficulty breathing or swallowing he needs to go to the ED and he verbalized understanding.   Copied from CRM [id number]. Topic: Clinical - Red Word Triage >> Aug 23, 2023 11:09 AM Ernst Spell wrote: Red Word that prompted transfer to Nurse Triage: had covid/ allergic reaction on hands and feet, gradually swelling. Reason for Disposition  [1] MODERATE-SEVERE widespread itching (i.e., interferes with sleep, normal activities or school) AND [2] not improved after 24 hours of itching Care Advice  Answer Assessment - Initial Assessment Questions 1. DESCRIPTION: "Describe the itching you are having."     Itching to hands and feet, inside of his thighs, places with chafing and contact - states he has had this problem for many years 2. SEVERITY: "How bad is it?"    - MILD: Doesn't interfere with normal activities.   - MODERATE-SEVERE: Interferes with work, school, sleep, or other  activities.      5/10  3. SCRATCHING: "Are there any scratch marks? Bleeding?"     Forcing himself not to scratch 4. ONSET: "When did this begin?"      "Couple days" - using cortisone cream  5. CAUSE: "What do you think is causing the itching?" (ask about swimming pools, pollen, animals, soaps, etc.)     Not sure - states he has this problem intermittently, states it could be the pollen 6. OTHER SYMPTOMS: "Do you have any other symptoms?"      No difficulty breathing or swallowing. Endorses redness with itching. No rash or raised skin. Splotchy redness. No pain. No swelling. No N/V.  Protocols used: Itching - Widespread-A-AH

## 2023-08-23 NOTE — Telephone Encounter (Signed)
 Noted- agree with appt  Samuel Covey MD [facility] at [facility]

## 2023-08-24 ENCOUNTER — Ambulatory Visit: Admitting: Family Medicine

## 2023-08-24 ENCOUNTER — Encounter: Payer: Self-pay | Admitting: Family Medicine

## 2023-08-24 ENCOUNTER — Ambulatory Visit (INDEPENDENT_AMBULATORY_CARE_PROVIDER_SITE_OTHER): Admitting: Family Medicine

## 2023-08-24 VITALS — BP 120/78 | HR 75 | Temp 98.2°F | Wt 228.0 lb

## 2023-08-24 DIAGNOSIS — L509 Urticaria, unspecified: Secondary | ICD-10-CM | POA: Diagnosis not present

## 2023-08-24 MED ORDER — PREDNISONE 10 MG PO TABS
ORAL_TABLET | ORAL | 0 refills | Status: DC
Start: 2023-08-24 — End: 2023-10-02

## 2023-08-24 NOTE — Progress Notes (Signed)
   Subjective:    Patient ID: Samuel [name], male    DOB: January 30, 1951, 73 y.o.   MRN: [id number]  HPI Here for a recurrent itchy rash that started yesterday. He has had this several over the years. It begins in the palms and soles, then spreads to the arms, legs ,and trunk. It consists of redness and whelps that come up and go down. No SOB or tongue swelling. No recent medication changes. No new foods recently, etc. In the past he has always taken antihistamines and Prednisone for this.    Review of Systems  Constitutional: Negative.   Respiratory: Negative.    Cardiovascular: Negative.   Gastrointestinal: Negative.   Skin:  Positive for rash.       Objective:   Physical Exam Constitutional:      Appearance: Normal appearance.  Cardiovascular:     Rate and Rhythm: Normal rate and regular rhythm.     Pulses: Normal pulses.     Heart sounds: Normal heart sounds.  Pulmonary:     Effort: Pulmonary effort is normal.     Breath sounds: Normal breath sounds.  Skin:    Comments: His palms have patches of macular erythema, and there is a red whelp on the inner left thigh  Neurological:     Mental Status: He is alert.           Assessment & Plan:  This is hives. We will treat with a 24 day taper of Prednisone beginning with 40 mg a day. We will add Pepcid 20 mg BID and Zyrtec 10 mg BID. Recheck as needed.  Gershon Crane, MD

## 2023-09-04 ENCOUNTER — Other Ambulatory Visit: Payer: Self-pay | Admitting: Internal Medicine

## 2023-09-04 ENCOUNTER — Other Ambulatory Visit: Payer: Self-pay | Admitting: Family Medicine

## 2023-09-04 DIAGNOSIS — M109 Gout, unspecified: Secondary | ICD-10-CM

## 2023-09-05 MED ORDER — AMPHETAMINE-DEXTROAMPHETAMINE 10 MG PO TABS
10.0000 mg | ORAL_TABLET | Freq: Two times a day (BID) | ORAL | 0 refills | Status: DC
Start: 2023-09-05 — End: 2023-12-31

## 2023-09-05 MED ORDER — AMPHETAMINE-DEXTROAMPHETAMINE 10 MG PO TABS: 10.0000 mg | ORAL_TABLET | Freq: Two times a day (BID) | ORAL | 0 refills | Status: DC

## 2023-09-20 DIAGNOSIS — B351 Tinea unguium: Secondary | ICD-10-CM | POA: Diagnosis not present

## 2023-09-20 DIAGNOSIS — M79674 Pain in right toe(s): Secondary | ICD-10-CM | POA: Diagnosis not present

## 2023-09-20 DIAGNOSIS — M79675 Pain in left toe(s): Secondary | ICD-10-CM | POA: Diagnosis not present

## 2023-10-02 ENCOUNTER — Other Ambulatory Visit

## 2023-10-02 ENCOUNTER — Ambulatory Visit (INDEPENDENT_AMBULATORY_CARE_PROVIDER_SITE_OTHER): Admitting: Family Medicine

## 2023-10-02 ENCOUNTER — Encounter: Payer: Self-pay | Admitting: Family Medicine

## 2023-10-02 ENCOUNTER — Other Ambulatory Visit: Payer: Self-pay | Admitting: Family Medicine

## 2023-10-02 VITALS — BP 215/110 | HR 76 | Temp 98.3°F | Ht 73.0 in | Wt 226.5 lb

## 2023-10-02 DIAGNOSIS — R450 Nervousness: Secondary | ICD-10-CM | POA: Diagnosis not present

## 2023-10-02 DIAGNOSIS — E039 Hypothyroidism, unspecified: Secondary | ICD-10-CM

## 2023-10-02 DIAGNOSIS — R630 Anorexia: Secondary | ICD-10-CM

## 2023-10-02 DIAGNOSIS — I10 Essential (primary) hypertension: Secondary | ICD-10-CM | POA: Diagnosis not present

## 2023-10-02 LAB — COMPREHENSIVE METABOLIC PANEL WITH GFR
ALT: 32 U/L (ref 0–53)
AST: 34 U/L (ref 0–37)
Albumin: 5.1 g/dL (ref 3.5–5.2)
Alkaline Phosphatase: 62 U/L (ref 39–117)
BUN: 9 mg/dL (ref 6–23)
CO2: 31 meq/L (ref 19–32)
Calcium: 9.5 mg/dL (ref 8.4–10.5)
Chloride: 98 meq/L (ref 96–112)
Creatinine, Ser: 0.84 mg/dL (ref 0.40–1.50)
GFR: 86.75 mL/min (ref >60.00)
Glucose, Bld: 122 mg/dL — ABNORMAL HIGH (ref 70–99)
Potassium: 4.5 meq/L (ref 3.5–5.1)
Sodium: 138 meq/L (ref 135–145)
Total Bilirubin: 1.6 mg/dL — ABNORMAL HIGH (ref 0.2–1.2)
Total Protein: 7.5 g/dL (ref 6.0–8.3)

## 2023-10-02 LAB — CBC WITH DIFFERENTIAL/PLATELET
Basophils Absolute: 0 10*3/uL (ref 0.0–0.1)
Basophils Relative: 0.6 % (ref 0.0–3.0)
Eosinophils Absolute: 0 10*3/uL (ref 0.0–0.7)
Eosinophils Relative: 0.9 % (ref 0.0–5.0)
HCT: 50.4 % (ref 39.0–52.0)
Hemoglobin: 16.8 g/dL (ref 13.0–17.0)
Lymphocytes Relative: 21.5 % (ref 12.0–46.0)
Lymphs Abs: 1.1 10*3/uL (ref 0.7–4.0)
MCHC: 33.4 g/dL (ref 30.0–36.0)
MCV: 93.6 fl (ref 78.0–100.0)
Monocytes Absolute: 0.7 10*3/uL (ref 0.1–1.0)
Monocytes Relative: 13.5 % — ABNORMAL HIGH (ref 3.0–12.0)
Neutro Abs: 3.2 10*3/uL (ref 1.4–7.7)
Neutrophils Relative %: 63.5 % (ref 43.0–77.0)
Platelets: 207 10*3/uL (ref 150.0–400.0)
RBC: 5.38 Mil/uL (ref 4.22–5.81)
RDW: 14.1 % (ref 11.5–15.5)
WBC: 5 10*3/uL (ref 4.0–10.5)

## 2023-10-02 LAB — TSH: TSH: 0.35 u[IU]/mL (ref 0.35–5.50)

## 2023-10-02 MED ORDER — AMLODIPINE BESYLATE 5 MG PO TABS: 5.0000 mg | ORAL_TABLET | Freq: Every day | ORAL | 3 refills | Status: DC

## 2023-10-02 NOTE — Patient Instructions (Signed)
 Set up 2 week follow up to reassess BP.

## 2023-10-02 NOTE — Progress Notes (Signed)
 Established Patient Office Visit  Subjective   Patient ID: Samuel [name], male    DOB: 03-Oct-1950  Age: 73 y.o. MRN: [id number]  Chief Complaint  Patient presents with   Medical Management of Chronic Issues    Started 4 days ago     HPI   Patient has history of bicuspid aortic valve with prior replacement, atrial fibrillation, hypothyroidism, ADD.  Complains of generally feeling poorly past 4 days or so.  Frequently feels jittery.  He describes almost like a "adrenaline rush ".  He is also noted poor appetite.  He reduced his Adderall to 5 mg but to no avail.  He does take levothyroxine  137 mcg daily and is due for follow-up labs to monitor that.  He is on chronic Coumadin .  Blood pressure is very high today which is atypical for him.  He denies any recent headaches.  He had some chronic atypical chest pains over several years which are unchanged.  No recent exertional chest pain.  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   Dyslipidemia    Gout    Hemorrhoids    HEMORRHOIDS-INTERNAL 12/20/2009   History of diverticulitis 03/2018   HYPERLIPIDEMIA-MIXED 04/01/2009   HYPOTHYROIDISM 08/26/2008   INSOMNIA, TRANSIENT 04/27/2009   Kidney stones    KNEE PAIN 11/02/2008   LATERAL EPICONDYLITIS 06/02/2009   Migraines    ONYCHOMYCOSIS 11/02/2008   PERSONAL HX COLONIC POLYPS 12/20/2009   Sudden visual loss 12/25/2008   TRANSIENT ISCHEMIC ATTACK 01/25/2009   Past Surgical History:  Procedure Laterality Date   CARDIAC VALVE REPLACEMENT     CARDIOVERSION N/A 10/05/2022   Procedure: CARDIOVERSION;  Surgeon: Bridgette Campus, MD;  Location: [facility];  Service: Cardiovascular;  Laterality: N/A;   CYSTECTOMY     from abdominal wall   MYRINGOTOMY WITH TUBE PLACEMENT Left    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 He has never used smokeless tobacco. He reports that  he does not currently use alcohol. He reports that he does not use drugs. family history includes Diabetes in his mother; Heart disease in his father. Allergies  Allergen Reactions   Shellfish Allergy Anaphylaxis   Penicillins     REACTION: childhood, reaction unknown    Review of Systems  Constitutional:  Positive for malaise/fatigue.  Eyes:  Negative for blurred vision.  Respiratory:  Negative for cough and shortness of breath.   Cardiovascular:  Negative for palpitations and leg swelling.  Gastrointestinal:  Negative for abdominal pain.  Genitourinary:  Negative for dysuria.  Neurological:  Negative for dizziness, weakness and headaches.      Objective:     BP (!) 215/110 (BP Location: Left Arm, Patient Position: Sitting, Cuff Size: Normal) Comment: Dr. Dorethia Ganser bp  Pulse 76   Temp 98.3 F (36.8 C) (Oral)   Ht 6\' 1"  (1.854 m)   Wt 226 lb 8 oz (102.7 kg)   SpO2 97%   BMI 29.88 kg/m  BP Readings from Last 3 Encounters:  10/02/23 (!) 215/110  08/24/23 120/78  05/29/23 (!) 169/99   Wt Readings from Last 3 Encounters:  10/02/23 226 lb 8 oz (102.7 kg)  08/24/23 228 lb (103.4 kg)  05/29/23 230 lb (104.3 kg)      Physical Exam Vitals reviewed.  Constitutional:      General: He  is not in acute distress.    Appearance: He is not ill-appearing.  Cardiovascular:     Rate and Rhythm: Normal rate and regular rhythm.     Comments: Only occasional premature beat Pulmonary:     Effort: Pulmonary effort is normal.     Breath sounds: Normal breath sounds. No wheezing or rales.  Musculoskeletal:     Cervical back: Neck supple.     Right lower leg: No edema.     Left lower leg: No edema.  Lymphadenopathy:     Cervical: No cervical adenopathy.  Neurological:     General: No focal deficit present.     Mental Status: He is alert.     Cranial Nerves: No cranial nerve deficit.      No results found for any visits on 10/02/23.  Last CBC Lab Results  Component Value Date    WBC 4.6 05/29/2023   HGB 15.9 05/29/2023   HCT 46.2 05/29/2023   MCV 93.3 05/29/2023   MCH 32.1 05/29/2023   RDW 13.2 05/29/2023   PLT 188 05/29/2023   Last metabolic panel Lab Results  Component Value Date   GLUCOSE 117 (H) 05/29/2023   NA 135 05/29/2023   K 4.7 05/29/2023   CL 102 05/29/2023   CO2 27 05/29/2023   BUN 13 05/29/2023   CREATININE 0.77 05/29/2023   GFRNONAA >60 05/29/2023   CALCIUM 9.0 05/29/2023   PROT 7.0 10/01/2020   ALBUMIN 4.7 10/01/2020   BILITOT 1.1 10/01/2020   ALKPHOS 62 10/01/2020   AST 34 10/01/2020   ALT 40 10/01/2020   ANIONGAP 6 05/29/2023   Last lipids Lab Results  Component Value Date   CHOL 171 07/26/2020   HDL 44 07/26/2020   LDLCALC 88 07/26/2020   LDLDIRECT 137.9 06/21/2012   TRIG 232 (H) 07/26/2020   CHOLHDL 3.9 07/26/2020   Last thyroid  functions Lab Results  Component Value Date   TSH 1.23 06/21/2022      The ASCVD Risk score (Arnett DK, et al., 2019) failed to calculate for the following reasons:   The valid systolic blood pressure range is 90 to 200 mmHg   Cannot find a previous HDL lab   Cannot find a previous total cholesterol lab    Assessment & Plan:   Problem List Items Addressed This Visit       Unprioritized   Hypothyroidism   Relevant Orders   TSH   Other Visit Diagnoses       Severe hypertension    -  Primary   Relevant Medications   amLODipine  (NORVASC ) 5 MG tablet     Poor appetite       Relevant Orders   CBC with Differential/Platelet   CMP     Jittery feeling       Relevant Orders   TSH     73 year old male with chronic history as above.  Presents today with 4-day history of poor appetite and "jittery "feeling of uncertain etiology.  His blood pressure is quite high with reading after rest left arm seated 215/110.  -Recommend checking some labs with TSH, CBC, CMP - Reduce alcohol.  Currently usually 2 drinks of vodka per day.  He will plan to discontinue this altogether until  follow-up in a couple weeks - Try to keep daily sodium intake less than 2000 mg - Start amlodipine  5 mg once daily - Set up 2-week follow-up follow-up sooner as needed - Need to reassess thyroid  with jittery sensation to make sure  not over replaced - If blood pressure not significantly responding to above consider looking for secondary causes  Return in about 2 weeks (around 10/16/2023).    Glean Lamy, MD

## 2023-10-03 ENCOUNTER — Ambulatory Visit (INDEPENDENT_AMBULATORY_CARE_PROVIDER_SITE_OTHER)

## 2023-10-03 VITALS — BP 152/78 | HR 82

## 2023-10-03 DIAGNOSIS — Z7901 Long term (current) use of anticoagulants: Secondary | ICD-10-CM | POA: Diagnosis not present

## 2023-10-03 LAB — POCT INR: INR: 2.6 (ref 2.0–3.0)

## 2023-10-03 MED ORDER — LEVOTHYROXINE SODIUM 125 MCG PO TABS: 125.0000 ug | ORAL_TABLET | Freq: Every day | ORAL | 0 refills | Status: DC

## 2023-10-03 NOTE — Progress Notes (Signed)
 Amlodipine  added yesterday. Pt requested BP check while at visit today due to concern over severely elevated BP reading in office yesterday. Note is being sent to PCP for signature.  BP 152/78 HR  82 O2sat  96% Continue 2 tablets daily except take 1 tablet on Tuesday and Friday. Recheck in 6 weeks. Pt refuses printed AVS and uses mychart for instructions.

## 2023-10-03 NOTE — Addendum Note (Signed)
 Addended by: Aurelio Leer on: 10/03/2023 08:37 AM   Modules accepted: Orders

## 2023-10-03 NOTE — Patient Instructions (Addendum)
 Pre visit review using our clinic review tool, if applicable. No additional management support is needed unless otherwise documented below in the visit note.  Continue 2 tablets daily except take 1 tablet on Tuesday and Friday. Recheck in 6 weeks.

## 2023-10-16 ENCOUNTER — Ambulatory Visit (INDEPENDENT_AMBULATORY_CARE_PROVIDER_SITE_OTHER): Admitting: Family Medicine

## 2023-10-16 ENCOUNTER — Encounter: Payer: Self-pay | Admitting: Family Medicine

## 2023-10-16 VITALS — BP 132/80 | HR 81 | Temp 97.8°F | Ht 73.0 in | Wt 220.8 lb

## 2023-10-16 DIAGNOSIS — M5030 Other cervical disc degeneration, unspecified cervical region: Secondary | ICD-10-CM | POA: Diagnosis not present

## 2023-10-16 DIAGNOSIS — G894 Chronic pain syndrome: Secondary | ICD-10-CM | POA: Diagnosis not present

## 2023-10-16 DIAGNOSIS — I10 Essential (primary) hypertension: Secondary | ICD-10-CM

## 2023-10-16 DIAGNOSIS — E039 Hypothyroidism, unspecified: Secondary | ICD-10-CM

## 2023-10-16 DIAGNOSIS — M51362 Other intervertebral disc degeneration, lumbar region with discogenic back pain and lower extremity pain: Secondary | ICD-10-CM | POA: Diagnosis not present

## 2023-10-16 DIAGNOSIS — Z7901 Long term (current) use of anticoagulants: Secondary | ICD-10-CM

## 2023-10-16 DIAGNOSIS — M47812 Spondylosis without myelopathy or radiculopathy, cervical region: Secondary | ICD-10-CM | POA: Diagnosis not present

## 2023-10-16 DIAGNOSIS — M47896 Other spondylosis, lumbar region: Secondary | ICD-10-CM | POA: Diagnosis not present

## 2023-10-16 DIAGNOSIS — M5412 Radiculopathy, cervical region: Secondary | ICD-10-CM | POA: Diagnosis not present

## 2023-10-16 DIAGNOSIS — M5416 Radiculopathy, lumbar region: Secondary | ICD-10-CM | POA: Diagnosis not present

## 2023-10-16 NOTE — Progress Notes (Signed)
 Established Patient Office Visit  Subjective   Patient ID: Samuel [name], male    DOB: 1951-04-14  Age: 73 y.o. MRN: [id number]  Chief Complaint  Patient presents with   Follow-up    Patient came in today for 2 week follow-up for Blood pressure     HPI   [name] is seen for follow-up regarding severely elevated blood pressure.  Refer to last note for details.  He came in with blood pressure of 215/110.  Had had several readings up in this range.  Felt very poorly overall.  We added amlodipine  5 mg daily.  He is also walking 2 and half miles per day and has eliminated vodka.  Blood pressure past few days has been consistently 120s systolic and occasionally even lower with diastolics 70s.  No chest pain.  Intermittent fluttering which he has had with A-fib in the past.  Also remains on low-dose Toprol  succinate 25 mg daily.  He has not seen any side effects from amlodipine  such as headache or peripheral edema.  Brings in his cuff today to compare with ours  He has lost additional 6 pounds but feels like some of this may be related to his increased walking and giving up vodka  We obtained recent labs.  CBC unremarkable.  Chemistries essentially normal.  TSH borderline over replaced at 0.35.  We reduced his levothyroxine  from 137 to 125 mcg daily.  We recommend a follow-up TSH in about 2 months  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   Dyslipidemia    Gout    Hemorrhoids    HEMORRHOIDS-INTERNAL 12/20/2009   History of diverticulitis 03/2018   HYPERLIPIDEMIA-MIXED 04/01/2009   HYPOTHYROIDISM 08/26/2008   INSOMNIA, TRANSIENT 04/27/2009   Kidney stones    KNEE PAIN 11/02/2008   LATERAL EPICONDYLITIS 06/02/2009   Migraines    ONYCHOMYCOSIS 11/02/2008   PERSONAL HX COLONIC POLYPS 12/20/2009   Sudden visual loss 12/25/2008   TRANSIENT ISCHEMIC ATTACK 01/25/2009   Past Surgical History:  Procedure Laterality Date   CARDIAC VALVE  REPLACEMENT     CARDIOVERSION N/A 10/05/2022   Procedure: CARDIOVERSION;  Surgeon: Bridgette Campus, MD;  Location: [facility];  Service: Cardiovascular;  Laterality: N/A;   CYSTECTOMY     from abdominal wall   MYRINGOTOMY WITH TUBE PLACEMENT Left    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 He has never used smokeless tobacco. He reports that he does not currently use alcohol. He reports that he does not use drugs. family history includes Diabetes in his mother; Heart disease in his father. Allergies  Allergen Reactions   Shellfish Allergy Anaphylaxis   Penicillins     REACTION: childhood, reaction unknown    Review of Systems  Constitutional:  Negative for malaise/fatigue.  Eyes:  Negative for blurred vision.  Respiratory:  Negative for shortness of breath.   Cardiovascular:  Negative for chest pain.  Neurological:  Negative for dizziness, weakness and headaches.      Objective:     BP 132/80 (BP Location: Left Arm, Patient Position: Sitting, Cuff Size: Normal)   Pulse 81   Temp 97.8 F (36.6 C) (Oral)   Ht 6\' 1"  (1.854 m)   Wt 220 lb 12.8 oz (100.2 kg)   SpO2 90%   BMI 29.13 kg/m  BP Readings from  Last 3 Encounters:  10/16/23 132/80  10/03/23 (!) 152/78  10/02/23 (!) 215/110   Wt Readings from Last 3 Encounters:  10/16/23 220 lb 12.8 oz (100.2 kg)  10/02/23 226 lb 8 oz (102.7 kg)  08/24/23 228 lb (103.4 kg)      Physical Exam Vitals reviewed.  Constitutional:      General: He is not in acute distress.    Appearance: He is not ill-appearing.  Cardiovascular:     Rate and Rhythm: Normal rate and regular rhythm.  Pulmonary:     Effort: Pulmonary effort is normal.     Breath sounds: Normal breath sounds. No wheezing or rales.  Musculoskeletal:     Right lower leg: No edema.     Left lower leg: No edema.  Neurological:     Mental Status: He is alert.      No results found for any  visits on 10/16/23.  Last CBC Lab Results  Component Value Date   WBC 5.0 10/02/2023   HGB 16.8 10/02/2023   HCT 50.4 10/02/2023   MCV 93.6 10/02/2023   MCH 32.1 05/29/2023   RDW 14.1 10/02/2023   PLT 207.0 10/02/2023   Last metabolic panel Lab Results  Component Value Date   GLUCOSE 122 (H) 10/02/2023   NA 138 10/02/2023   K 4.5 10/02/2023   CL 98 10/02/2023   CO2 31 10/02/2023   BUN 9 10/02/2023   CREATININE 0.84 10/02/2023   GFR 86.75 10/02/2023   CALCIUM 9.5 10/02/2023   PROT 7.5 10/02/2023   ALBUMIN 5.1 10/02/2023   BILITOT 1.6 (H) 10/02/2023   ALKPHOS 62 10/02/2023   AST 34 10/02/2023   ALT 32 10/02/2023   ANIONGAP 6 05/29/2023   Last lipids Lab Results  Component Value Date   CHOL 171 07/26/2020   HDL 44 07/26/2020   LDLCALC 88 07/26/2020   LDLDIRECT 137.9 06/21/2012   TRIG 232 (H) 07/26/2020   CHOLHDL 3.9 07/26/2020   Last hemoglobin A1c Lab Results  Component Value Date   HGBA1C  02/23/2009    5.8 (NOTE) The ADA recommends the following therapeutic goal for glycemic control related to Hgb A1c measurement: Goal of therapy: <6.5 Hgb A1c  Reference: American Diabetes Association: Clinical Practice Recommendations 2010, Diabetes Care, 2010, 33: (Suppl  1).   Last thyroid  functions Lab Results  Component Value Date   TSH 0.35 10/02/2023      The ASCVD Risk score (Arnett DK, et al., 2019) failed to calculate for the following reasons:   Cannot find a previous HDL lab   Cannot find a previous total cholesterol lab    Assessment & Plan:   #1 recent severe hypertension.  Tremendously improved today after recent addition of amlodipine .  He has made some positive lifestyle changes with giving up vodka and increased walking.  Continue periodic monitoring at home.  His cuff today measured reading 126/79 and with ours 132/80.  Continue to watch sodium intake  #2 hypothyroidism.  Slightly over replaced by recent labs.  We adjusted levothyroxine  to 125 mcg  daily.  Recheck TSH in 2 to 3 months  #3 history of aortic valve replacement.  Patient on chronic Coumadin .  Continue close follow-up with Coumadin  clinic    Return in about 3 months (around 01/16/2024).    Glean Lamy, MD

## 2023-11-07 ENCOUNTER — Ambulatory Visit (INDEPENDENT_AMBULATORY_CARE_PROVIDER_SITE_OTHER): Admitting: Family Medicine

## 2023-11-07 VITALS — BP 122/78 | HR 72 | Temp 97.9°F | Wt 221.0 lb

## 2023-11-07 DIAGNOSIS — K5900 Constipation, unspecified: Secondary | ICD-10-CM

## 2023-11-07 DIAGNOSIS — M109 Gout, unspecified: Secondary | ICD-10-CM | POA: Diagnosis not present

## 2023-11-07 DIAGNOSIS — I10 Essential (primary) hypertension: Secondary | ICD-10-CM | POA: Diagnosis not present

## 2023-11-07 NOTE — Patient Instructions (Signed)
 Consider trial of Miralax daily as needed for constipation.  Could also try OTC stool softener such as Colace 100 mg two daily.

## 2023-11-07 NOTE — Progress Notes (Signed)
 Established Patient Office Visit  Subjective   Patient ID: Samuel [name], male    DOB: [birth year]  Age: 73 y.o. MRN: [id number]  Chief Complaint  Patient presents with   Constipation    Patient complains of constipation, x3 weeks    HPI   Samuel [name] is seen today to discuss constipation issues.  He has recently been going only about every 3 days or so.  Some straining when he goes as well.  He has taken Dulcolax with some success.  He recently started walking about 2 to 3 miles per day and is drinking, he feels, plenty of fluids.  Also eats a lot of raw vegetables and salads and he thinks he is probably getting adequate fiber.  Only recent new medication is amlodipine .  He does take low-dose oxycodone per orthopedics with 5 mg once or twice daily.  Has been on this though for 5 years.  We recently reduced dosage of his thyroid  replacement medication as he had borderline low TSH.  He also recently reduced vodka consumption.  Home blood pressures remain well-controlled on amlodipine  with mostly 120s systolic and 70s or so diastolic.  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   Dyslipidemia    Gout    Hemorrhoids    HEMORRHOIDS-INTERNAL 12/20/2009   History of diverticulitis 03/2018   HYPERLIPIDEMIA-MIXED 04/01/2009   HYPOTHYROIDISM 08/26/2008   INSOMNIA, TRANSIENT 04/27/2009   Kidney stones    KNEE PAIN 11/02/2008   LATERAL EPICONDYLITIS 06/02/2009   Migraines    ONYCHOMYCOSIS 11/02/2008   PERSONAL HX COLONIC POLYPS 12/20/2009   Sudden visual loss 12/25/2008   TRANSIENT ISCHEMIC ATTACK 01/25/2009   Past Surgical History:  Procedure Laterality Date   CARDIAC VALVE REPLACEMENT     CARDIOVERSION N/A 10/05/2022   Procedure: CARDIOVERSION;  Surgeon: Bridgette Campus, MD;  Location: [facility];  Service: Cardiovascular;  Laterality: N/A;   CYSTECTOMY     from abdominal wall   MYRINGOTOMY WITH TUBE PLACEMENT Left    S/P AVR      with aortic root replacement 02/2009   TONSILLECTOMY      reports that he has quit smoking. He has never used smokeless tobacco. He reports that he does not currently use alcohol. He reports that he does not use drugs. family history includes Diabetes in his mother; Heart disease in his father. Allergies  Allergen Reactions   Shellfish Allergy Anaphylaxis   Penicillins     REACTION: childhood, reaction unknown    Review of Systems  Constitutional:  Negative for chills and fever.  Respiratory:  Negative for shortness of breath.   Cardiovascular:  Negative for chest pain.  Gastrointestinal:  Positive for constipation. Negative for abdominal pain and blood in stool.      Objective:     BP 122/78 (BP Location: Left Arm, Cuff Size: Normal)   Pulse 72   Temp 97.9 F (36.6 C) (Oral)   Wt 221 lb (100.2 kg)   SpO2 96%   BMI 29.16 kg/m  BP Readings from Last 3 Encounters:  11/07/23 122/78  10/16/23 132/80  10/03/23 (!) 152/78   Wt Readings from Last 3 Encounters:  11/07/23 221 lb (100.2 kg)  10/16/23 220 lb 12.8 oz (100.2 kg)  10/02/23 226 lb 8 oz (102.7 kg)      Physical Exam Vitals reviewed.  Constitutional:      General: He is not in acute distress.  Appearance: He is not ill-appearing.  Cardiovascular:     Rate and Rhythm: Normal rate.     Comments: Prominent aortic click from his previous aortic valve surgery Pulmonary:     Effort: Pulmonary effort is normal.     Breath sounds: Normal breath sounds.  Musculoskeletal:     Right lower leg: No edema.     Left lower leg: No edema.  Neurological:     Mental Status: He is alert.      No results found for any visits on 11/07/23.  Last CBC Lab Results  Component Value Date   WBC 5.0 10/02/2023   HGB 16.8 10/02/2023   HCT 50.4 10/02/2023   MCV 93.6 10/02/2023   MCH 32.1 05/29/2023   RDW 14.1 10/02/2023   PLT 207.0 10/02/2023   Last metabolic panel Lab Results  Component Value Date   GLUCOSE 122 (H)  10/02/2023   NA 138 10/02/2023   K 4.5 10/02/2023   CL 98 10/02/2023   CO2 31 10/02/2023   BUN 9 10/02/2023   CREATININE 0.84 10/02/2023   GFR 86.75 10/02/2023   CALCIUM 9.5 10/02/2023   PROT 7.5 10/02/2023   ALBUMIN 5.1 10/02/2023   BILITOT 1.6 (H) 10/02/2023   ALKPHOS 62 10/02/2023   AST 34 10/02/2023   ALT 32 10/02/2023   ANIONGAP 6 05/29/2023   Last lipids Lab Results  Component Value Date   CHOL 171 07/26/2020   HDL 44 07/26/2020   LDLCALC 88 07/26/2020   LDLDIRECT 137.9 06/21/2012   TRIG 232 (H) 07/26/2020   CHOLHDL 3.9 07/26/2020   Last thyroid  functions Lab Results  Component Value Date   TSH 0.35 10/02/2023      The ASCVD Risk score (Arnett DK, et al., 2019) failed to calculate for the following reasons:   Cannot find a previous HDL lab   Cannot find a previous total cholesterol lab    Assessment & Plan:   #1 constipation.  Patient on chronic opioid with oxycodone 5 mg twice daily but is recently developed constipation even though has been on oxycodone for 5 years.  Did recently start amlodipine  but this would  not be a common side effect from amlodipine  but certainly possible  -Continue regular walking - Continue good fluid consumption - Recommend at least 30 g of fiber per day - Consider over-the-counter stool softener such as Colace 100 mg 2 daily - Consider as needed use of MiraLAX 17 g once or twice daily as needed - Avoid regular use of stimulant laxatives - He may discuss with orthopedist possible alternatives to getting off opioid  #2 hypertension.  Initial reading was up but significantly improved after rest and at goal.  Continue recent healthy lifestyle changes with walking, weight loss and continue amlodipine .  We did mention that he may possibly be able to come off medication at some point with his recent positive lifestyle changes  #3 history of gout.  He inquired about coming off allopurinol .  We discussed potential risk of coming off with  more frequent gout flareups and even potential destructive changes of gout with erosive arthritis.  He is considering reducing allopurinol  to half dose to see if he has any flareups as a first step  Glean Lamy, MD

## 2023-11-14 ENCOUNTER — Ambulatory Visit (INDEPENDENT_AMBULATORY_CARE_PROVIDER_SITE_OTHER)

## 2023-11-14 DIAGNOSIS — Z7901 Long term (current) use of anticoagulants: Secondary | ICD-10-CM | POA: Diagnosis not present

## 2023-11-14 LAB — POCT INR: INR: 2 (ref 2.0–3.0)

## 2023-11-14 NOTE — Progress Notes (Signed)
 Pt has been eating more salads. Continue 2 tablets daily except take 1 tablet on Tuesday and Friday. Recheck in 6 weeks. Pt refuses printed AVS and uses mychart for instructions.

## 2023-11-14 NOTE — Patient Instructions (Addendum)
 Pre visit review using our clinic review tool, if applicable. No additional management support is needed unless otherwise documented below in the visit note.  Continue 2 tablets daily except take 1 tablet on Tuesday and Friday. Recheck in 6 weeks.

## 2023-11-18 ENCOUNTER — Other Ambulatory Visit: Payer: Self-pay | Admitting: Family Medicine

## 2023-11-18 DIAGNOSIS — Z7901 Long term (current) use of anticoagulants: Secondary | ICD-10-CM

## 2023-11-19 NOTE — Telephone Encounter (Signed)
 Pt is compliant with warfarin management and PCP apts.  Sent in refill of warfarin to requested pharmacy.

## 2023-11-22 ENCOUNTER — Ambulatory Visit: Attending: Cardiovascular Disease | Admitting: Cardiovascular Disease

## 2023-11-22 ENCOUNTER — Encounter: Payer: Self-pay | Admitting: Cardiovascular Disease

## 2023-11-22 VITALS — BP 140/72 | HR 58 | Ht 73.0 in | Wt 221.0 lb

## 2023-11-22 DIAGNOSIS — I4819 Other persistent atrial fibrillation: Secondary | ICD-10-CM | POA: Diagnosis not present

## 2023-11-22 NOTE — Patient Instructions (Signed)
 Medication Instructions:  Your physician recommends that you continue on your current medications as directed. Please refer to the Current Medication list given to you today. *If you need a refill on your cardiac medications before your next appointment, please call your pharmacy*  Follow-Up: At [facility], you and your health needs are our priority.  As part of our continuing mission to provide you with exceptional heart care, our providers are all part of one team.  This team includes your primary Cardiologist (physician) and Advanced Practice Providers or APPs (Physician Assistants and Nurse Practitioners) who all work together to provide you with the care you need, when you need it.  Your next appointment:   1 year(s)  Provider:   You will see one of the following Advanced Practice Providers on your designated Care Team:   Mertha Abrahams, PA-C Michael Andy Tillery, PA-C Suzann Riddle, NP Creighton Doffing, NP  Other Instructions Please schedule a follow up with your general cardiologist (Dr Avanell Bob) in 6 months

## 2023-11-22 NOTE — Progress Notes (Signed)
 Electrophysiology Office Note:    Date:  11/22/2023   ID:  Samuel [name], DOB May 16, 1951, MRN [id number]  PCP:  Marquetta Sit, MD   [facility] Providers Cardiologist:  Ola Berger, MD Electrophysiologist:  Efraim Grange, MD     Referring MD: Marquetta Sit, MD   History of Present Illness:    Samuel [name] is a 73 y.o. male with a hx listed below, significant for bicuspid aorta s/p [place] Jude mechanical AVR and ascending aortic root replacement in 2010, TIA, hypothyroidism, chronic use of coumadin  referred for arrhythmia management.  New onset AF diagnosed in January, 2024.  He noticed that his heart rates were very elevated while playing golf.  He has had slowly progressive fatigue over the past year.  He underwent DC cardioversion on October 05, 2022. He didn't feel particularly different after the cardioversion. His fatigue is essentially unchanged.  He is very aware of his A-fib because he can sense the irregularity of his rhythm in his artificial mitral valve.  He reports that he really has not had any A-fib since the last visit though he does notice an irregular beat occasionally.   EKGs/Labs/Other Studies Reviewed Today:    Echocardiogram:  TTE 08/30/2022  1. Left ventricular ejection fraction, by estimation, is 55 to 60%. The  left ventricle has normal function. The left ventricle has no regional  wall motion abnormalities. There is mild concentric left ventricular  hypertrophy. Left ventricular diastolic function could not be evaluated.   2. Right ventricular systolic function is low normal. The right  ventricular size is normal. There is normal pulmonary artery systolic  pressure. The estimated right ventricular systolic pressure is 24.2 mmHg.   3. Left atrial size was severely dilated.   4. Right atrial size was severely dilated.   5. The mitral valve is normal in structure. Trivial mitral valve  regurgitation. No evidence of mitral stenosis.   6.  The aortic valve has been repaired/replaced. Aortic valve  regurgitation is trivial. There is a mechanical valve present in the  aortic position. Procedure Date: 2010. Echo findings are consistent with  normal structure and function of the aortic valve  prosthesis. Aortic valve mean gradient measures 7.8 mmHg. Aortic valve Vmax measures 1.81 m/s. Aortic valve acceleration time measures 60 msec.   7. Aortic dilatation noted. There is borderline dilatation of the  ascending aorta, measuring 39 mm.   8. The inferior vena cava is dilated in size with >50% respiratory  variability, suggesting right atrial pressure of 8 mmHg.    Monitors:  ZioPatch 07/05/2022 100% AF HR 37-210 bpm, avg 72 5.5% PVCs Triggered events are associated with PVCs  Stress testing:   Advanced imaging:     EKG:   EKG Interpretation Date/Time:  Thursday November 22 2023 10:28:59 EDT Ventricular Rate:  58 PR Interval:  248 QRS Duration:  96 QT Interval:  436 QTC Calculation: 428 R Axis:   31  Text Interpretation: Sinus bradycardia with sinus arrhythmia with 1st degree A-V block When compared with ECG of 29-May-2023 10:00, No significant change was found Confirmed by Marlane Silver [phone] on 11/22/2023 10:42:44 AM     Recent Labs: 10/02/2023: ALT 32; BUN 9; Creatinine, Ser 0.84; Hemoglobin 16.8; Platelets 207.0; Potassium 4.5; Sodium 138; TSH 0.35     Physical Exam:    VS:  BP (!) 140/72   Pulse (!) 58   Ht 6' 1 (1.854 m)   Wt 221 lb (100.2 kg)  SpO2 98%   BMI 29.16 kg/m     Wt Readings from Last 3 Encounters:  11/22/23 221 lb (100.2 kg)  11/07/23 221 lb (100.2 kg)  10/16/23 220 lb 12.8 oz (100.2 kg)     GEN: Well nourished, well developed in no acute distress CARDIAC: RRR, no murmurs, rubs, gallops RESPIRATORY:  Normal work of breathing MUSCULOSKELETAL: no edema    ASSESSMENT & PLAN:    Atrial fibrillation Persistent Bilateral atria are severely dilated Continue warfarin for atrial  fibrillation and presence of a mechanical aortic valve By symptoms, his atrial fibrillation burden is very low No significant change in symptoms after DCCV. Will not aggressively pursue rhythm control at this time -- antiarrhythmic drugs do not decrease risk of adverse outcomes, ablation would be more difficult/risky with presence of artificial mitral valve Continue metoprolol  XL 25 daily  St. Jude mechanical aortic valve in place Continue warfarin        Medication Adjustments/Labs and Tests Ordered: Current medicines are reviewed at length with the patient today.  Concerns regarding medicines are outlined above.  Orders Placed This Encounter  Procedures   EKG 12-Lead   No orders of the defined types were placed in this encounter.    Signed, Efraim Grange, MD  11/22/2023 10:32 AM    [facility]

## 2023-11-26 ENCOUNTER — Encounter: Payer: Self-pay | Admitting: Nurse Practitioner

## 2023-11-27 DIAGNOSIS — B351 Tinea unguium: Secondary | ICD-10-CM | POA: Diagnosis not present

## 2023-11-27 DIAGNOSIS — M79675 Pain in left toe(s): Secondary | ICD-10-CM | POA: Diagnosis not present

## 2023-11-27 DIAGNOSIS — M79674 Pain in right toe(s): Secondary | ICD-10-CM | POA: Diagnosis not present

## 2023-11-29 ENCOUNTER — Encounter: Payer: Self-pay | Admitting: Family Medicine

## 2023-11-29 ENCOUNTER — Ambulatory Visit: Admitting: Family Medicine

## 2023-11-29 VITALS — BP 117/74

## 2023-11-29 DIAGNOSIS — Z0000 Encounter for general adult medical examination without abnormal findings: Secondary | ICD-10-CM

## 2023-11-29 NOTE — Progress Notes (Signed)
 PATIENT CHECK-IN and HEALTH RISK ASSESSMENT QUESTIONNAIRE:  -completed by phone/video for upcoming Medicare Preventive Visit  Pre-Visit Check-in: 1)Vitals (height, wt, BP, etc) - record in vitals section for visit on day of visit Request home vitals (wt, BP, etc.) and enter into vitals, THEN update Vital Signs SmartPhrase below at the top of the HPI. See below.  2)Review and Update Medications, Allergies PMH, Surgeries, Social history in Epic 3)Hospitalizations in the last year with date/reason? no  4)Review and Update Care Team (patient's specialists) in Epic 5) Complete PHQ9 in Epic  6) Complete Fall Screening in Epic 7)Review all Health Maintenance Due and order under PCP if not done.  Medicare Wellness Patient Questionnaire:  Answer theses question about your habits: How often do you have a drink containing alcohol?n/a How many drinks containing alcohol do you have on a typical day when you are drinking?n/a How often do you have six or more drinks on one occasion?n/a Have you ever smoked? Yes  Quit date if applicable? 30 years   How many packs a day do/did you smoke? 1 1/2 Do you use smokeless tobacco? no Do you use an illicit drugs?no On average, how many days per week do you engage in moderate to strenuous exercise (like a brisk walk)?7 days  On average, how many minutes do you engage in exercise at this level? 45-55 Are you sexually active?  Yes Typical breakfast: Coffee Typical lunch: Varies  Typical dinner: Varies, lots of veggies, mostly lean proteins Typical snacks: Ice cream   Beverages:  Juice   Answer theses question about your everyday activities: Can you perform most household chores? Yes  Are you deaf or have significant trouble hearing? no Do you feel that you have a problem with memory? Sometimes forgets actor's name or what he came in a room to get - usually comes to him Do you feel safe at home? Yes  Last dentist visit? 2 months  8. Do you have any  difficulty performing your everyday activities?no Are you having any difficulty walking, taking medications on your own, and or difficulty managing daily home needs? no Do you have difficulty walking or climbing stairs?no Do you have difficulty dressing or bathing?no Do you have difficulty doing errands alone such as visiting a doctor's office or shopping? no Do you currently have any difficulty preparing food and eating? no Do you currently have any difficulty using the toilet?no Do you have any difficulty managing your finances?no Do you have any difficulties with housekeeping of managing your housekeeping?no   Do you have Advanced Directives in place (Living Will, Healthcare Power or Attorney)?  Yes    Last eye Exam and location? 2 years ago    Do you currently use prescribed or non-prescribed narcotic or opioid pain medications? Yes   Do you have a history or close family history of breast, ovarian, tubal or peritoneal cancer or a family member with BRCA (breast cancer susceptibility 1 and 2) gene mutations? No    Nurse/Assistant Credentials/time stamp: MG 3:41 PM    ----------------------------------------------------------------------------------------------------------------------------------------------------------------------------------------------------------------------  Because this visit was a virtual/telehealth visit, some criteria may be missing or patient reported. Any vitals not documented were not able to be obtained and vitals that have been documented are patient reported.    MEDICARE ANNUAL PREVENTIVE CARE VISIT WITH PROVIDER (Welcome to Medicare, initial annual wellness or annual wellness exam)  Virtual Visit via Phone Note  I connected with Samuel [name] on 11/29/23  by phone  and verified that I  am speaking with the correct person using two identifiers. Prefers phone visit.   Location patient: home Location provider:work or home office Persons  participating in the virtual visit: patient, provider  Concerns and/or follow up today: stable for the most part. Walking 3 miles a day and feeling better after a spell with elevated BP earlier this year.    See HM section in Epic for other details of completed HM.    ROS: negative for report of fevers, unintentional weight loss, vision changes from baseline, vision loss, hearing loss or change, chest pain, sob, hemoptysis, melena, hematochezia, hematuria, falls, bleeding or bruising, thoughts of suicide or self harm, memory loss  Patient-completed extensive health risk assessment - reviewed and discussed with the patient: See Health Risk Assessment completed with patient prior to the visit either above or in recent phone note. This was reviewed in detailed with the patient today and appropriate recommendations, orders and referrals were placed as needed per Summary below and patient instructions.   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   Dyslipidemia    Gout    Hemorrhoids    HEMORRHOIDS-INTERNAL 12/20/2009   History of diverticulitis 03/2018   HYPERLIPIDEMIA-MIXED 04/01/2009   HYPOTHYROIDISM 08/26/2008   INSOMNIA, TRANSIENT 04/27/2009   Kidney stones    KNEE PAIN 11/02/2008   LATERAL EPICONDYLITIS 06/02/2009   Migraines    ONYCHOMYCOSIS 11/02/2008   PERSONAL HX COLONIC POLYPS 12/20/2009   Sudden visual loss 12/25/2008   TRANSIENT ISCHEMIC ATTACK 01/25/2009    Past Surgical History:  Procedure Laterality Date   CARDIAC VALVE REPLACEMENT     CARDIOVERSION  N/A 10/05/2022   Procedure: CARDIOVERSION;  Surgeon: Bridgette Campus, MD;  Location: [facility] INVASIVE CV LAB;  Service: Cardiovascular;  Laterality: N/A;   CYSTECTOMY     from abdominal wall   MYRINGOTOMY WITH TUBE PLACEMENT Left    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   TONSILLECTOMY      Social History   Socioeconomic History   Marital status: Married    Spouse name: Not on file   Number of children: 3   Years of education: Not on file   Highest education level: Not on file  Occupational History   Not on file  Tobacco Use   Smoking status: Former   Smokeless tobacco: Never   Tobacco comments:    Quit smoking 1994  Vaping Use   Vaping status: Never Used  Substance and Sexual Activity   Alcohol use: Not Currently    Comment: pt states he hasnt really drank this year   Drug use: No   Sexual activity: Not on file  Other Topics Concern   Not on file  Social History Narrative   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   Social Drivers of Health   Financial Resource Strain: Low Risk  (11/29/2023)   Overall Financial Resource Strain (CARDIA)    Difficulty of Paying Living Expenses: Not hard at all  Food Insecurity: No Food Insecurity (11/29/2023)   Hunger Vital Sign  Worried About Programme researcher, broadcasting/film/video in the Last Year: Never true    Ran Out of Food in the Last Year: Never true  Transportation Needs: No Transportation Needs (11/29/2023)   PRAPARE - Administrator, Civil Service (Medical): No    Lack of Transportation (Non-Medical): No  Physical Activity: Sufficiently Active (11/29/2023)   Exercise Vital Sign    Days of Exercise per Week: 7 days    Minutes of Exercise per Session: 50 min  Stress: Stress Concern Present (11/29/2023)   [employer] of Occupational Health - Occupational Stress Questionnaire    Feeling of Stress: To some extent  Social Connections: Moderately Integrated (11/29/2023)   Social Connection and Isolation Panel     Frequency of Communication with Friends and Family: More than three times a week    Frequency of Social Gatherings with Friends and Family: More than three times a week    Attends Religious Services: More than 4 times per year    Active Member of [employer] or Organizations: No    Attends Banker Meetings: Never    Marital Status: Married  Catering manager Violence: Not At Risk (11/29/2023)   Humiliation, Afraid, Rape, and Kick questionnaire    Fear of Current or Ex-Partner: No    Emotionally Abused: No    Physically Abused: No    Sexually Abused: No    Family History  Problem Relation Age of Onset   Diabetes Mother    Heart disease Father        valve problem    Current Outpatient Medications on File Prior to Visit  Medication Sig Dispense Refill   allopurinol  (ZYLOPRIM ) 300 MG tablet TAKE 1 TABLET BY MOUTH EVERY DAY 90 tablet 0   amLODipine  (NORVASC ) 5 MG tablet Take 1 tablet (5 mg total) by mouth daily. 30 tablet 3   amphetamine -dextroamphetamine  (ADDERALL) 10 MG tablet Take 1 tablet (10 mg total) by mouth 2 (two) times daily. 60 tablet 0   amphetamine -dextroamphetamine  (ADDERALL) 10 MG tablet Take 1 tablet (10 mg total) by mouth 2 (two) times daily. 60 tablet 0   amphetamine -dextroamphetamine  (ADDERALL) 10 MG tablet Take 1 tablet (10 mg total) by mouth 2 (two) times daily. 60 tablet 0   levothyroxine  (SYNTHROID ) 125 MCG tablet Take 1 tablet (125 mcg total) by mouth daily. 90 tablet 0   metoprolol  succinate (TOPROL -XL) 25 MG 24 hr tablet TAKE 1 TABLET (25 MG TOTAL) BY MOUTH DAILY. 90 tablet 0   oxyCODONE (OXY IR/ROXICODONE) 5 MG immediate release tablet Take 5 mg by mouth 2 (two) times daily as needed for severe pain or moderate pain.     sildenafil  (VIAGRA ) 100 MG tablet Take 0.5-1 tablets (50-100 mg total) by mouth daily as needed for erectile dysfunction. 30 tablet 1   warfarin (COUMADIN ) 5 MG tablet TAKE 2 TABLETS BY MOUTH DAILY EXCEPT TAKE 1 TABLET ON TUESDAYS AND  FRIDAYS OR AS DIRECTED BY ANTICOAGULATION CLINIC 200 tablet 1   No current facility-administered medications on file prior to visit.    Allergies  Allergen Reactions   Shellfish Allergy Anaphylaxis   Penicillins     REACTION: childhood, reaction unknown       Physical Exam Vitals requested from patient and listed below if patient had equipment and was able to obtain at home for this virtual visit: Vitals:   11/29/23 1624  BP: 117/74   Estimated body mass index is 29.16 kg/m as calculated from the following:   Height as  of 11/22/23: 6' 1 (1.854 m).   Weight as of 11/22/23: 221 lb (100.2 kg).  EKG (optional): deferred due to virtual visit  GENERAL: alert, oriented, no acute distress detected; full vision exam deferred due to pandemic and/or virtual encounter  PSYCH/NEURO: pleasant and cooperative, no obvious depression or anxiety, speech and thought processing grossly intact, Cognitive function grossly intact  Flowsheet Row Clinical Support from 11/29/2023 in [facility] [place]  PHQ-9 Total Score 2        11/29/2023    3:30 PM 10/02/2023   10:33 AM 09/11/2022    4:19 PM 06/16/2022    2:58 PM 06/06/2022   10:29 AM  Depression screen PHQ 2/9  Decreased Interest 0 2 0 0 0  Down, Depressed, Hopeless 0 2 0 0 0  PHQ - 2 Score 0 4 0 0 0  Altered sleeping 0 2 1    Tired, decreased energy 0 2 1    Change in appetite 0 2 0    Feeling bad or failure about yourself  0 2 0    Trouble concentrating 2 1 0    Moving slowly or fidgety/restless 0 1 0    Suicidal thoughts 0 0 0    PHQ-9 Score 2 14 2     Difficult doing work/chores Not difficult at all  Not difficult at all         06/06/2022   10:29 AM 06/16/2022    2:58 PM 09/11/2022    4:19 PM 10/02/2023   10:33 AM 11/29/2023    3:30 PM  Fall Risk  Falls in the past year? 0 0 0 0 0  Was there an injury with Fall? 0 0 0 0 0  Fall Risk Category Calculator 0 0 0 0 0  Fall Risk Category (Retired) Low  Low       (RETIRED) Patient Fall Risk Level Low fall risk  Low fall risk      Patient at Risk for Falls Due to No Fall Risks No Fall Risks No Fall Risks No Fall Risks No Fall Risks  Fall risk Follow up Falls evaluation completed  Falls evaluation completed  Falls evaluation completed Falls evaluation completed Falls evaluation completed     Data saved with a previous flowsheet row definition     SUMMARY AND PLAN:  Medicare annual wellness visit, subsequent   Discussed applicable health maintenance/preventive health measures and advised and referred or ordered per patient preferences: -discussed vaccine risks and recs, advised can get at the pharmacy  -reports he is on the fence about doing a colonoscopy, does [facility] and reports is due. reports has discussed with his cardiologist and gastroenterologist and has increased risks due to anticoagulation. He is considering and agrees to discuss further with his specialists.  Health Maintenance  Topic Date Due   Hepatitis C Screening  Never done   Pneumococcal Vaccine: 50+ Years (1 of 2 - PCV) Never done   Zoster Vaccines- Shingrix (1 of 2) Never done   COVID-19 Vaccine (4 - 2024-25 season) 02/11/2023   DTaP/Tdap/Td (2 - Td or Tdap) 11/06/2023   INFLUENZA VACCINE  01/11/2024   Colonoscopy  10/21/2024   Medicare Annual Wellness (AWV)  11/28/2024   HPV VACCINES  Aged Out   Meningococcal B Vaccine  Aged [employer] and counseling on the following was provided based on the above review of health and a plan/checklist for the patient, along with additional information discussed, was provided  for the patient in the patient instructions :   -Advised and counseled on a healthy lifestyle - including the importance of a healthy diet, regular physical activity, social connections and stress management. -Reviewed patient's current diet. Advised and counseled on a whole foods based healthy diet. A summary of a healthy diet was provided in the Patient  Instructions.  -reviewed patient's current physical activity level and discussed exercise guidelines for adults. Congratulated on current walking. Further community resources and ideas for safe exercise at home to assist in meeting exercise guideline recommendations in a safe and healthy way provided in patient instructions.  -Advise yearly dental visits at minimum and regular eye exams   Follow up: see patient instructions   Patient Instructions  I really enjoyed getting to talk with you today! I am available on Tuesdays and Thursdays for virtual visits if you have any questions or concerns, or if I can be of any further assistance.   CHECKLIST FROM ANNUAL WELLNESS VISIT:  -Follow up (please call to schedule if not scheduled after visit):   -yearly for annual wellness visit with primary care office  Here is a list of your preventive care/health maintenance measures and the plan for each if any are due:  PLAN For any measures below that may be due:    1. Please talk further with your specialists about the colonoscopy. Let us  know if there is anything we can do to assist.    2. Can get vaccines at the pharmacy. If you do, please let us  know so that we can update your record.   Health Maintenance  Topic Date Due   Hepatitis C Screening  Never done   Pneumococcal Vaccine: 50+ Years (1 of 2 - PCV) Never done   Zoster Vaccines- Shingrix (1 of 2) Never done   COVID-19 Vaccine (4 - 2024-25 season) 02/11/2023   DTaP/Tdap/Td (2 - Td or Tdap) 11/06/2023   INFLUENZA VACCINE  01/11/2024   Colonoscopy  10/21/2024   Medicare Annual Wellness (AWV)  11/28/2024   HPV VACCINES  Aged Out   Meningococcal B Vaccine  Aged Out    -See a dentist at least yearly  -Get your eyes checked and then per your eye specialist's recommendations  -Other issues addressed today:   -I have included below further information regarding a healthy whole foods based diet, physical activity guidelines for adults,  stress management and opportunities for social connections. I hope you find this information useful.   -----------------------------------------------------------------------------------------------------------------------------------------------------------------------------------------------------------------------------------------------------------    NUTRITION: -eat real food: lots of colorful vegetables (half the plate) and fruits -5-7 servings of vegetables and fruits per day (fresh or steamed is best), exp. 2 servings of vegetables with lunch and dinner and 2 servings of fruit per day. Berries and greens such as kale and collards are great choices.  -consume on a regular basis:  fresh fruits, fresh veggies, fish, nuts, seeds, healthy oils (such as olive oil, avocado oil), whole grains (make sure for bread/pasta/crackers/etc., that the first ingredient on label contains the word whole), legumes. -can eat small amounts of dairy and lean meat (no larger than the palm of your hand), but avoid processed meats such as ham, bacon, lunch meat, etc. -drink water -try to avoid fast food and pre-packaged foods, processed meat, ultra processed foods/beverages (donuts, candy, etc.) -most experts advise limiting sodium to < 2300mg  per day, should limit further is any chronic conditions such as high blood pressure, heart disease, diabetes, etc. The American Heart Association advised that <  1500mg  is is ideal -try to avoid foods/beverages that contain any ingredients with names you do not recognize  -try to avoid foods/beverages  with added sugar or sweeteners/sweets  -try to avoid sweet drinks (including diet drinks): soda, juice, Gatorade, sweet tea, power drinks, diet drinks -try to avoid white rice, white bread, pasta (unless whole grain)  EXERCISE GUIDELINES FOR ADULTS: -if you wish to increase your physical activity, do so gradually and with the approval of your doctor -STOP and seek medical  care immediately if you have any chest pain, chest discomfort or trouble breathing when starting or increasing exercise  -move and stretch your body, legs, feet and arms when sitting for long periods -Physical activity guidelines for optimal health in adults: -get at least 150 minutes per week of moderate exercise (can talk, but not sing); this is about 20-30 minutes of sustained activity 5-7 days per week or two 10-15 minute episodes of sustained activity 5-7 days per week -do some muscle building/resistance training/strength training at least 2 days per week  -balance exercises 3+ days per week:   Stand somewhere where you have something sturdy to hold onto if you lose balance    1) lift up on toes, then back down, start with 5x per day and work up to 20x   2) stand and lift one leg straight out to the side so that foot is a few inches of the floor, start with 5x each side and work up to 20x each side   3) stand on one foot, start with 5 seconds each side and work up to 20 seconds on each side  If you need ideas or help with getting more active:  -Silver sneakers https://tools.silversneakers.com  -Walk with a Doc: http://www.duncan-williams.com/  -try to include resistance (weight lifting/strength building) and balance exercises twice per week: or the following link for ideas: http://castillo-powell.com/  BuyDucts.dk  STRESS MANAGEMENT: -can try meditating, or just sitting quietly with deep breathing while intentionally relaxing all parts of your body for 5 minutes daily -if you need further help with stress, anxiety or depression please follow up with your primary doctor or contact the wonderful folks at [facility]: [phone]  SOCIAL CONNECTIONS: -options in [place] if you wish to engage in more social and exercise related activities:  -Silver  sneakers https://tools.silversneakers.com  -Walk with a Doc: http://www.duncan-williams.com/  -Check out the [facility] Active Adults 50+ section on the [employer] [employer] (hiking clubs, book clubs, cards and games, chess, exercise classes, aquatic classes and much more) - see the website for details: https://www.[place].gov/departments/parks-recreation/active-adults50  -YouTube has lots of exercise videos for different ages and abilities as well  -[facility] (a variety of indoor and outdoor inperson activities for adults). (819)768-8742. [address]  -Virtual Online Classes (a variety of topics): see seniorplanet.org or call 907-740-4129  -consider volunteering at a school, hospice center, church, senior center or elsewhere            Maurie Southern, DO

## 2023-11-29 NOTE — Progress Notes (Signed)
 Patient unable to obtain vital signs due to telehealth visit

## 2023-11-29 NOTE — Patient Instructions (Signed)
 I really enjoyed getting to talk with you today! I am available on Tuesdays and Thursdays for virtual visits if you have any questions or concerns, or if I can be of any further assistance.   CHECKLIST FROM ANNUAL WELLNESS VISIT:  -Follow up (please call to schedule if not scheduled after visit):   -yearly for annual wellness visit with primary care office  Here is a list of your preventive care/health maintenance measures and the plan for each if any are due:  PLAN For any measures below that may be due:    1. Please talk further with your specialists about the colonoscopy. Let us  know if there is anything we can do to assist.    2. Can get vaccines at the pharmacy. If you do, please let us  know so that we can update your record.   Health Maintenance  Topic Date Due   Hepatitis C Screening  Never done   Pneumococcal Vaccine: 50+ Years (1 of 2 - PCV) Never done   Zoster Vaccines- Shingrix (1 of 2) Never done   COVID-19 Vaccine (4 - 2024-25 season) 02/11/2023   DTaP/Tdap/Td (2 - Td or Tdap) 11/06/2023   INFLUENZA VACCINE  01/11/2024   Colonoscopy  10/21/2024   Medicare Annual Wellness (AWV)  11/28/2024   HPV VACCINES  Aged Out   Meningococcal B Vaccine  Aged Out    -See a dentist at least yearly  -Get your eyes checked and then per your eye specialist's recommendations  -Other issues addressed today:   -I have included below further information regarding a healthy whole foods based diet, physical activity guidelines for adults, stress management and opportunities for social connections. I hope you find this information useful.   -----------------------------------------------------------------------------------------------------------------------------------------------------------------------------------------------------------------------------------------------------------    NUTRITION: -eat real food: lots of colorful vegetables (half the plate) and fruits -5-7  servings of vegetables and fruits per day (fresh or steamed is best), exp. 2 servings of vegetables with lunch and dinner and 2 servings of fruit per day. Berries and greens such as kale and collards are great choices.  -consume on a regular basis:  fresh fruits, fresh veggies, fish, nuts, seeds, healthy oils (such as olive oil, avocado oil), whole grains (make sure for bread/pasta/crackers/etc., that the first ingredient on label contains the word whole), legumes. -can eat small amounts of dairy and lean meat (no larger than the palm of your hand), but avoid processed meats such as ham, bacon, lunch meat, etc. -drink water -try to avoid fast food and pre-packaged foods, processed meat, ultra processed foods/beverages (donuts, candy, etc.) -most experts advise limiting sodium to < 2300mg  per day, should limit further is any chronic conditions such as high blood pressure, heart disease, diabetes, etc. The American Heart Association advised that < 1500mg  is is ideal -try to avoid foods/beverages that contain any ingredients with names you do not recognize  -try to avoid foods/beverages  with added sugar or sweeteners/sweets  -try to avoid sweet drinks (including diet drinks): soda, juice, Gatorade, sweet tea, power drinks, diet drinks -try to avoid white rice, white bread, pasta (unless whole grain)  EXERCISE GUIDELINES FOR ADULTS: -if you wish to increase your physical activity, do so gradually and with the approval of your doctor -STOP and seek medical care immediately if you have any chest pain, chest discomfort or trouble breathing when starting or increasing exercise  -move and stretch your body, legs, feet and arms when sitting for long periods -Physical activity guidelines for optimal health in adults: -get  at least 150 minutes per week of moderate exercise (can talk, but not sing); this is about 20-30 minutes of sustained activity 5-7 days per week or two 10-15 minute episodes of sustained  activity 5-7 days per week -do some muscle building/resistance training/strength training at least 2 days per week  -balance exercises 3+ days per week:   Stand somewhere where you have something sturdy to hold onto if you lose balance    1) lift up on toes, then back down, start with 5x per day and work up to 20x   2) stand and lift one leg straight out to the side so that foot is a few inches of the floor, start with 5x each side and work up to 20x each side   3) stand on one foot, start with 5 seconds each side and work up to 20 seconds on each side  If you need ideas or help with getting more active:  -Silver sneakers https://tools.silversneakers.com  -Walk with a Doc: http://www.duncan-williams.com/  -try to include resistance (weight lifting/strength building) and balance exercises twice per week: or the following link for ideas: http://castillo-powell.com/  BuyDucts.dk  STRESS MANAGEMENT: -can try meditating, or just sitting quietly with deep breathing while intentionally relaxing all parts of your body for 5 minutes daily -if you need further help with stress, anxiety or depression please follow up with your primary doctor or contact the wonderful folks at [facility]: [phone]  SOCIAL CONNECTIONS: -options in [place] if you wish to engage in more social and exercise related activities:  -Silver sneakers https://tools.silversneakers.com  -Walk with a Doc: http://www.duncan-williams.com/  -Check out the [facility] Active Adults 50+ section on the [place] [employer] (hiking clubs, book clubs, cards and games, chess, exercise classes, aquatic classes and much more) - see the website for details: https://www.[place].gov/departments/parks-recreation/active-adults50  -YouTube has lots of exercise videos for different ages and abilities as well  -[facility] (a variety of indoor and outdoor inperson activities for adults). 7055455533. [address]  -Virtual Online Classes (a variety of topics): see seniorplanet.org or call [phone]  -consider volunteering at a school, hospice center, church, senior center or elsewhere

## 2023-12-05 ENCOUNTER — Ambulatory Visit: Admitting: Family Medicine

## 2023-12-18 NOTE — Progress Notes (Unsigned)
 Cardiology Office Note    Patient Name: Samuel [name] Date of Encounter: 12/19/2023  Primary Care Provider:  Micheal Wolm ORN, MD Primary Cardiologist:  None Primary Electrophysiologist: Eulas FORBES Furbish, MD   Past Medical History    Past Medical History:  Diagnosis Date   Aortic aneurysm [facility])    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   Dyslipidemia    Gout    Hemorrhoids    HEMORRHOIDS-INTERNAL 12/20/2009   History of diverticulitis 03/2018   HYPERLIPIDEMIA-MIXED 04/01/2009   HYPOTHYROIDISM 08/26/2008   INSOMNIA, TRANSIENT 04/27/2009   Kidney stones    KNEE PAIN 11/02/2008   LATERAL EPICONDYLITIS 06/02/2009   Migraines    ONYCHOMYCOSIS 11/02/2008   PERSONAL HX COLONIC POLYPS 12/20/2009   Sudden visual loss 12/25/2008   TRANSIENT ISCHEMIC ATTACK 01/25/2009    History of Present Illness  Samuel [name] is a 73 y.o. male with PMH of atrial fibrillation, bicuspid aortic valve s/p Saint Jude mechanical AVR with ascending aortic root replacement 2010, TIA, hypothyroidism, HLD who presents today for annual follow-up.  [name] was last seen on 11/21/2022 following DCCV for new onset atrial fibrillation.  He had adjustment made to his metoprolol  due to fatigue.  He was noted to have stable blood pressures and was last seen by Dr. Furbish on 11/22/2023 and noted no change to his fatigue.  He reported no significant episodes of A-fib since his last visit but notes occasional irregular beats.  He reported no significant change in symptoms overall and decision was made not to pursue rhythm control at that time with ablation found to be difficult due to artificial mitral valve.  [name] presents today for annual follow-up.  He reports doing well since his previous follow-up and has experienced episodes of atrial fibrillation characterized by increased fatigue and a fluttering sensation in his heart. These episodes occur sporadically, lasting for an hour or two, and resolve with standing and deep  breathing. He recalls an episode three to four weeks ago. He is currently on Toprol  XL for rate control. In April, he experienced a significant increase in blood pressure, recorded at 210/130-140 mmHg, which led to the initiation of amlodipine . Since then, his blood pressure has stabilized to around 120/70-75 mmHg. He notes swelling in one ankle. He monitors his blood pressure regularly and reports it is under control. He engages in regular physical activity, walking two and a half to three miles daily at a brisk pace. He experiences occasional dizziness, particularly when standing up quickly. He has also experienced wooziness during night walks, which he describes as random and not related to exertion. He has a history of valve disease and atrial dilation. He notes that his atrial dilation progressed from mild to significant over a year. He has ceased alcohol consumption since April, following a high blood pressure reading, and has not consumed alcohol since. He is currently taking warfarin without any bleeding issues and continues to take Adderall, typically once a day, occasionally twice if needed for alertness during late activities. Patient denies chest pain, palpitations, dyspnea, PND, orthopnea, nausea, vomiting, dizziness, syncope, edema, weight gain, or early satiety.  Discussed the use of AI scribe software for clinical note transcription with the patient, who gave verbal consent to proceed.  History of Present Illness   Review of Systems  Please see the history of present illness.    All other systems reviewed and are otherwise negative except as noted above.  Physical Exam    Wt Readings from Last  3 Encounters:  12/19/23 223 lb (101.2 kg)  11/22/23 221 lb (100.2 kg)  11/07/23 221 lb (100.2 kg)   VS: Vitals:   12/19/23 1401  BP: 134/70  Pulse: 74  SpO2: 95%  ,Body mass index is 29.42 kg/m. GEN: Well nourished, well developed in no acute distress Neck: No JVD; No carotid  bruits Pulmonary: Clear to auscultation without rales, wheezing or rhonchi  Cardiovascular: Normal rate. Regular rhythm. Normal S1. Normal S2.   Murmurs: 3/6 systolic murmur ABDOMEN: Soft, non-tender, non-distended EXTREMITIES:  No edema; No deformity   EKG/LABS/ Recent Cardiac Studies   ECG personally reviewed by me today -none completed today  Risk Assessment/Calculations:    CHA2DS2-VASc Score = 5   This indicates a 7.2% annual risk of stroke. The patient's score is based upon: CHF History: 0 HTN History: 1 Diabetes History: 0 Stroke History: 2 Vascular Disease History: 1 Age Score: 1 Gender Score: 0     STOP-Bang Score:         Lab Results  Component Value Date   WBC 5.0 10/02/2023   HGB 16.8 10/02/2023   HCT 50.4 10/02/2023   MCV 93.6 10/02/2023   PLT 207.0 10/02/2023   Lab Results  Component Value Date   CREATININE 0.84 10/02/2023   BUN 9 10/02/2023   NA 138 10/02/2023   K 4.5 10/02/2023   CL 98 10/02/2023   CO2 31 10/02/2023   Lab Results  Component Value Date   CHOL 171 07/26/2020   HDL 44 07/26/2020   LDLCALC 88 07/26/2020   LDLDIRECT 137.9 06/21/2012   TRIG 232 (H) 07/26/2020   CHOLHDL 3.9 07/26/2020    Lab Results  Component Value Date   HGBA1C  02/23/2009    5.8 (NOTE) The ADA recommends the following therapeutic goal for glycemic control related to Hgb A1c measurement: Goal of therapy: <6.5 Hgb A1c  Reference: American Diabetes Association: Clinical Practice Recommendations 2010, Diabetes Care, 2010, 33: (Suppl  1).   Assessment & Plan    Assessment & Plan   1.History of aortic stenosis with AVR repair: -s/p Saint Jude mechanical AVR repair in 2010 -SBE prophylaxis discussed -Continue Coumadin  as managed by Coumadin  clinic  2.  Persistent atrial fibrillation: -Intermittent atrial fibrillation managed with Toprol . Ablation not recommended due to artificial valve and atrial dilation. Alcohol cessation may reduce triggers. - Continue  Toprol  for rate control. - Advise on lifestyle modifications, including hydration and avoiding alcohol.  3.  Mixed HLD: - Patient's last LDL cholesterol was 88 - Continue treatment plan per PCP  4.  Dizziness: Intermittent dizziness possibly related to Toprol  and amlodipine . Occurs during night walking. - Advise standing up slowly. - Consider evening medication dosing. - Ensure adequate hydration, especially during travel.  5.  Primary hypertension: Previously uncontrolled hypertension now managed with amlodipine  5 mg. Current readings stable with minimal side effects. - Continue amlodipine  5 mg and Toprol -XL 25 mg for blood pressure control. - Discuss potential side effects, including ankle swelling.  Disposition: Follow-up with None or APP in 6 months    Signed, Wyn Raddle, Jackee Shove, NP 12/19/2023, 5:51 PM [facility]

## 2023-12-19 ENCOUNTER — Ambulatory Visit: Attending: Cardiovascular Disease | Admitting: Nurse Practitioner

## 2023-12-19 ENCOUNTER — Encounter: Payer: Self-pay | Admitting: Nurse Practitioner

## 2023-12-19 VITALS — BP 134/70 | HR 74 | Ht 73.0 in | Wt 223.0 lb

## 2023-12-19 DIAGNOSIS — E782 Mixed hyperlipidemia: Secondary | ICD-10-CM | POA: Insufficient documentation

## 2023-12-19 DIAGNOSIS — I10 Essential (primary) hypertension: Secondary | ICD-10-CM | POA: Insufficient documentation

## 2023-12-19 DIAGNOSIS — E785 Hyperlipidemia, unspecified: Secondary | ICD-10-CM | POA: Insufficient documentation

## 2023-12-19 DIAGNOSIS — I4819 Other persistent atrial fibrillation: Secondary | ICD-10-CM | POA: Diagnosis not present

## 2023-12-19 DIAGNOSIS — Z952 Presence of prosthetic heart valve: Secondary | ICD-10-CM | POA: Insufficient documentation

## 2023-12-19 DIAGNOSIS — I359 Nonrheumatic aortic valve disorder, unspecified: Secondary | ICD-10-CM | POA: Diagnosis not present

## 2023-12-19 DIAGNOSIS — E039 Hypothyroidism, unspecified: Secondary | ICD-10-CM

## 2023-12-19 MED ORDER — AMLODIPINE BESYLATE 5 MG PO TABS: 5.0000 mg | ORAL_TABLET | Freq: Every day | ORAL | 3 refills | Status: DC

## 2023-12-19 NOTE — Patient Instructions (Signed)
 Medication Instructions:  Your physician recommends that you continue on your current medications as directed. Please refer to the Current Medication list given to you today. *If you need a refill on your cardiac medications before your next appointment, please call your pharmacy*  Lab Work: None ordered If you have labs (blood work) drawn today and your tests are completely normal, you will receive your results only by: MyChart Message (if you have MyChart) OR A paper copy in the mail If you have any lab test that is abnormal or we need to change your treatment, we will call you to review the results.  Testing/Procedures: None ordered  Follow-Up: At [facility], you and your health needs are our priority.  As part of our continuing mission to provide you with exceptional heart care, our providers are all part of one team.  This team includes your primary Cardiologist (physician) and Advanced Practice Providers or APPs (Physician Assistants and Nurse Practitioners) who all work together to provide you with the care you need, when you need it.  Your next appointment:   6 month(s)  Provider:   Ola Berger, MD    We recommend signing up for the patient portal called "MyChart".  Sign up information is provided on this After Visit Summary.  MyChart is used to connect with patients for Virtual Visits (Telemedicine).  Patients are able to view lab/test results, encounter notes, upcoming appointments, etc.  Non-urgent messages can be sent to your provider as well.   To learn more about what you can do with MyChart, go to [email or web address].   Other Instructions

## 2023-12-26 ENCOUNTER — Ambulatory Visit (INDEPENDENT_AMBULATORY_CARE_PROVIDER_SITE_OTHER)

## 2023-12-26 DIAGNOSIS — Z7901 Long term (current) use of anticoagulants: Secondary | ICD-10-CM

## 2023-12-26 LAB — POCT INR: INR: 1.9 — AB (ref 2.0–3.0)

## 2023-12-26 NOTE — Progress Notes (Signed)
 Pt denies any changes.  Pt was at the very lowest of his INR range at 2.0 at last INR check. Due to no determined changes to cause subtherapeutic INR, a small change to weekly dose has been made. Advised pt if any s/s of abnormal bruising or bleeding to go to the ER. Pt verbalized understanding.  Increase dose today to take 3 tablets and then change weekly dose to take 2 tablets daily except take 1 tablet on Tuesday. Recheck in 3 weeks. Pt refuses printed AVS and uses mychart for instructions.

## 2023-12-26 NOTE — Patient Instructions (Addendum)
 Pre visit review using our clinic review tool, if applicable. No additional management support is needed unless otherwise documented below in the visit note.  Increase dose today to take 3 tablets and then change weekly dose to take 2 tablets daily except take 1 tablet on Tuesday. Recheck in 3 weeks.

## 2023-12-28 ENCOUNTER — Ambulatory Visit: Payer: Self-pay

## 2023-12-28 NOTE — Telephone Encounter (Signed)
 FYI Only or Action Required?: FYI only for provider.  Patient was last seen in primary care on 11/29/2023 by Luke Chiquita SAUNDERS, DO.  Called Nurse Triage reporting Hematuria.  Symptoms began x 3 days.  Interventions attempted: Nothing.  Symptoms are: unchanged.  Triage Disposition: See Within 3 Days in Office (overriding See Physician Within 24 Hours)  Patient/caregiver understands and will follow disposition?: Yes     Copied from CRM [id number]. Topic: Clinical - Red Word Triage >> Dec 28, 2023  4:50 PM Chasity T wrote: [facility] that prompted transfer to Nurse Triage: patient is mentioning blood in urine. Reason for Disposition  Pain or burning with passing urine    Small spots of blood found in urine x 3 days no pain or burning: pt only wanted to be scheduled with his PCP therefore appt in office on Tues 7/22.  Answer Assessment - Initial Assessment Questions 1. COLOR of URINE: Describe the color of the urine.  (e.g., tea-colored, pink, red, bloody) Do you have blood clots in your urine? (e.g., none, pea, grape, small coin)     Red/rink no clots 2. ONSET: When did the bleeding start?      X 3 days 3. EPISODES: How many times has there been blood in the urine? or How many times today?     Comes and goes 4. PAIN with URINATION: Is there any pain with passing your urine? If Yes, ask: How bad is the pain?  (Scale 1-10; or mild, moderate, severe)     no 5. FEVER: Do you have a fever? If Yes, ask: What is your temperature, how was it measured, and when did it start?     no 6. ASSOCIATED SYMPTOMS: Are you passing urine more frequently than usual?     no 7. OTHER SYMPTOMS: Do you have any other symptoms? (e.g., back/flank pain, abdomen pain, vomiting)     no 8. PREGNANCY: Is there any chance you are pregnant? When was your last menstrual period?     na  Protocols used: Urine - Blood In-A-AH

## 2023-12-29 ENCOUNTER — Other Ambulatory Visit: Payer: Self-pay | Admitting: Family Medicine

## 2023-12-30 ENCOUNTER — Encounter: Payer: Self-pay | Admitting: Family Medicine

## 2023-12-31 MED ORDER — LEVOTHYROXINE SODIUM 125 MCG PO TABS: 125.0000 ug | ORAL_TABLET | Freq: Every day | ORAL | 0 refills | Status: DC

## 2023-12-31 MED ORDER — AMLODIPINE BESYLATE 5 MG PO TABS: 5.0000 mg | ORAL_TABLET | Freq: Every day | ORAL | 3 refills | Status: AC

## 2023-12-31 MED ORDER — LEVOTHYROXINE SODIUM 125 MCG PO TABS: 125.0000 ug | ORAL_TABLET | Freq: Every day | ORAL | 3 refills | Status: DC

## 2023-12-31 MED ORDER — AMPHETAMINE-DEXTROAMPHETAMINE 10 MG PO TABS: 10.0000 mg | ORAL_TABLET | Freq: Two times a day (BID) | ORAL | 0 refills | Status: DC

## 2023-12-31 NOTE — Addendum Note (Signed)
 Addended by: MICHEAL WOLM ORN on: 12/31/2023 08:29 AM   Modules accepted: Orders

## 2024-01-01 ENCOUNTER — Ambulatory Visit (INDEPENDENT_AMBULATORY_CARE_PROVIDER_SITE_OTHER): Admitting: Family Medicine

## 2024-01-01 VITALS — BP 160/88 | HR 66 | Temp 98.0°F | Wt 220.6 lb

## 2024-01-01 DIAGNOSIS — I10 Essential (primary) hypertension: Secondary | ICD-10-CM

## 2024-01-01 DIAGNOSIS — E039 Hypothyroidism, unspecified: Secondary | ICD-10-CM | POA: Diagnosis not present

## 2024-01-01 DIAGNOSIS — R319 Hematuria, unspecified: Secondary | ICD-10-CM | POA: Diagnosis not present

## 2024-01-01 DIAGNOSIS — Z125 Encounter for screening for malignant neoplasm of prostate: Secondary | ICD-10-CM

## 2024-01-01 LAB — POC URINALSYSI DIPSTICK (AUTOMATED)
Bilirubin, UA: NEGATIVE
Blood, UA: POSITIVE
Glucose, UA: NEGATIVE
Ketones, UA: NEGATIVE
Nitrite, UA: NEGATIVE
Protein, UA: POSITIVE — AB
Spec Grav, UA: 1.01 (ref 1.010–1.025)
Urobilinogen, UA: 0.2 U/dL
pH, UA: 6 (ref 5.0–8.0)

## 2024-01-01 NOTE — Progress Notes (Signed)
 Established Patient Office Visit  Subjective   Patient ID: Samuel [name], male    DOB: [birth year]  Age: 73 y.o. MRN: [id number]  Chief Complaint  Patient presents with   Hematuria    HPI   Samuel [name] is seen with 6-day history of some pinkish colored urine.  He states last night this was even darker more like wine.  He recognized this as bloody urine.  Does take Coumadin  and had INR just 6 days ago 1.9.  Has had remote history of kidney stone but no recent flank pain or abdominal pain.  No burning with urination.  No fevers or chills.  Denies any past history of gross hematuria.  He has been doing excellent job with lifestyle change with regular walking and exercise and has noted increased capacity for exercise and overall feels better since starting his walking program.  Has not had any recent PSA.  He has hypothyroidism.  Had recent TSH 0.35 and we have suggested reducing his levothyroxine  to 125 mcg daily.  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   Dyslipidemia    Gout    Hemorrhoids    HEMORRHOIDS-INTERNAL 12/20/2009   History of diverticulitis 03/2018   HYPERLIPIDEMIA-MIXED 04/01/2009   HYPOTHYROIDISM 08/26/2008   INSOMNIA, TRANSIENT 04/27/2009   Kidney stones    KNEE PAIN 11/02/2008   LATERAL EPICONDYLITIS 06/02/2009   Migraines    ONYCHOMYCOSIS 11/02/2008   PERSONAL HX COLONIC POLYPS 12/20/2009   Sudden visual loss 12/25/2008   TRANSIENT ISCHEMIC ATTACK 01/25/2009   Past Surgical History:  Procedure Laterality Date   CARDIAC VALVE REPLACEMENT     CARDIOVERSION N/A 10/05/2022   Procedure: CARDIOVERSION;  Surgeon: Alvan Ronal BRAVO, MD;  Location: [facility] INVASIVE CV LAB;  Service: Cardiovascular;  Laterality: N/A;   CYSTECTOMY     from abdominal wall   MYRINGOTOMY WITH TUBE PLACEMENT Left    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 He has never used smokeless  tobacco. He reports that he does not currently use alcohol. He reports that he does not use drugs. family history includes Diabetes in his mother; Heart disease in his father. Allergies  Allergen Reactions   Shellfish Allergy Anaphylaxis   Penicillins     REACTION: childhood, reaction unknown    Review of Systems  Constitutional:  Negative for chills, fever and weight loss.  Respiratory:  Negative for shortness of breath.   Cardiovascular:  Negative for chest pain.  Gastrointestinal:  Negative for abdominal pain.  Genitourinary:  Positive for hematuria. Negative for flank pain and urgency.      Objective:     BP (!) 160/88 (BP Location: Left Arm, Patient Position: Sitting, Cuff Size: Normal)   Pulse 66   Temp 98 F (36.7 C) (Oral)   Wt 220 lb 9.6 oz (100.1 kg)   SpO2 95%   BMI 29.10 kg/m  BP Readings from Last 3 Encounters:  01/01/24 (!) 160/88  12/19/23 134/70  11/29/23 117/74   Wt Readings from Last 3 Encounters:  01/01/24 220 lb 9.6 oz (100.1 kg)  12/19/23 223 lb (101.2 kg)  11/22/23 221 lb (100.2 kg)      Physical Exam Vitals reviewed.  Constitutional:      General: He is not in acute distress.    Appearance: He is not ill-appearing.  Cardiovascular:  Rate and Rhythm: Normal rate.  Pulmonary:     Effort: Pulmonary effort is normal.     Breath sounds: Normal breath sounds. No wheezing or rales.  Neurological:     Mental Status: He is alert.      Results for orders placed or performed in visit on 01/01/24  POCT Urinalysis Dipstick (Automated)  Result Value Ref Range   Color, UA Red    Clarity, UA Cloudy    Glucose, UA Negative Negative   Bilirubin, UA Negative    Ketones, UA Negative    Spec Grav, UA 1.010 1.010 - 1.025   Blood, UA Positive    pH, UA 6.0 5.0 - 8.0   Protein, UA Positive (A) Negative   Urobilinogen, UA 0.2 0.2 or 1.0 E.U./dL   Nitrite, UA Negative    Leukocytes, UA Trace (A) Negative    Last CBC Lab Results  Component  Value Date   WBC 5.0 10/02/2023   HGB 16.8 10/02/2023   HCT 50.4 10/02/2023   MCV 93.6 10/02/2023   MCH 32.1 05/29/2023   RDW 14.1 10/02/2023   PLT 207.0 10/02/2023   Last metabolic panel Lab Results  Component Value Date   GLUCOSE 122 (H) 10/02/2023   NA 138 10/02/2023   K 4.5 10/02/2023   CL 98 10/02/2023   CO2 31 10/02/2023   BUN 9 10/02/2023   CREATININE 0.84 10/02/2023   GFR 86.75 10/02/2023   CALCIUM 9.5 10/02/2023   PROT 7.5 10/02/2023   ALBUMIN 5.1 10/02/2023   BILITOT 1.6 (H) 10/02/2023   ALKPHOS 62 10/02/2023   AST 34 10/02/2023   ALT 32 10/02/2023   ANIONGAP 6 05/29/2023   Last lipids Lab Results  Component Value Date   CHOL 171 07/26/2020   HDL 44 07/26/2020   LDLCALC 88 07/26/2020   LDLDIRECT 137.9 06/21/2012   TRIG 232 (H) 07/26/2020   CHOLHDL 3.9 07/26/2020   Last hemoglobin A1c Lab Results  Component Value Date   HGBA1C  02/23/2009    5.8 (NOTE) The ADA recommends the following therapeutic goal for glycemic control related to Hgb A1c measurement: Goal of therapy: <6.5 Hgb A1c  Reference: American Diabetes Association: Clinical Practice Recommendations 2010, Diabetes Care, 2010, 33: (Suppl  1).   Last thyroid  functions Lab Results  Component Value Date   TSH 0.35 10/02/2023      The ASCVD Risk score (Arnett DK, et al., 2019) failed to calculate for the following reasons:   Cannot find a previous HDL lab   Cannot find a previous total cholesterol lab    Assessment & Plan:   #1 gross hematuria past 6 days.  This has been painless hematuria.  Patient is on Coumadin  but had recent INR 1.9.  Urine dipstick positive for blood and trace leukocytes.  We sent urine for culture and microscopy.  Will set up urology referral for his recent gross hematuria.  #2 hypothyroidism.  Recent TSH borderline low 0.35.  We adjusted his thyroid  medication recheck TSH today  #3 hypertension.  Initial blood pressure up at 160/88 and improved dramatically after  rest 128/80.  Continue amlodipine  and metoprolol .   No follow-ups on file.    Wolm Scarlet, MD

## 2024-01-02 ENCOUNTER — Ambulatory Visit: Payer: Self-pay | Admitting: Family Medicine

## 2024-01-02 DIAGNOSIS — E039 Hypothyroidism, unspecified: Secondary | ICD-10-CM

## 2024-01-02 LAB — URINE CULTURE
MICRO NUMBER:: 16729955
Result:: NO GROWTH
SPECIMEN QUALITY:: ADEQUATE

## 2024-01-02 LAB — URINALYSIS, ROUTINE W REFLEX MICROSCOPIC
Bilirubin Urine: NEGATIVE
Ketones, ur: NEGATIVE
Leukocytes,Ua: NEGATIVE
Nitrite: NEGATIVE
Specific Gravity, Urine: 1.01 (ref 1.000–1.030)
Total Protein, Urine: 100 — AB
Urine Glucose: NEGATIVE
Urobilinogen, UA: 0.2 (ref 0.0–1.0)
pH: 6 (ref 5.0–8.0)

## 2024-01-02 LAB — PSA, MEDICARE: PSA: 1.12 ng/mL (ref 0.10–4.00)

## 2024-01-02 LAB — TSH: TSH: 0.12 u[IU]/mL — ABNORMAL LOW (ref 0.35–5.50)

## 2024-01-02 MED ORDER — LEVOTHYROXINE SODIUM 112 MCG PO TABS: 112.0000 ug | ORAL_TABLET | Freq: Every day | ORAL | 0 refills | Status: DC

## 2024-01-03 DIAGNOSIS — R310 Gross hematuria: Secondary | ICD-10-CM | POA: Diagnosis not present

## 2024-01-03 DIAGNOSIS — R8289 Other abnormal findings on cytological and histological examination of urine: Secondary | ICD-10-CM | POA: Diagnosis not present

## 2024-01-04 ENCOUNTER — Encounter (HOSPITAL_COMMUNITY): Payer: Self-pay | Admitting: Emergency Medicine

## 2024-01-04 ENCOUNTER — Emergency Department (HOSPITAL_COMMUNITY)

## 2024-01-04 ENCOUNTER — Emergency Department (HOSPITAL_COMMUNITY)
Admission: EM | Admit: 2024-01-04 | Discharge: 2024-01-04 | Disposition: A | Discharge status: Home / Self Care | Attending: Emergency Medicine | Admitting: Emergency Medicine

## 2024-01-04 ENCOUNTER — Other Ambulatory Visit: Payer: Self-pay

## 2024-01-04 DIAGNOSIS — Z7901 Long term (current) use of anticoagulants: Secondary | ICD-10-CM | POA: Insufficient documentation

## 2024-01-04 DIAGNOSIS — N390 Urinary tract infection, site not specified: Secondary | ICD-10-CM | POA: Diagnosis not present

## 2024-01-04 DIAGNOSIS — R109 Unspecified abdominal pain: Secondary | ICD-10-CM | POA: Diagnosis not present

## 2024-01-04 DIAGNOSIS — N2882 Megaloureter: Secondary | ICD-10-CM | POA: Diagnosis not present

## 2024-01-04 DIAGNOSIS — N200 Calculus of kidney: Secondary | ICD-10-CM | POA: Diagnosis not present

## 2024-01-04 LAB — CBC WITH DIFFERENTIAL/PLATELET
Abs Immature Granulocytes: 0.03 K/uL (ref 0.00–0.07)
Basophils Absolute: 0 K/uL (ref 0.0–0.1)
Basophils Relative: 1 %
Eosinophils Absolute: 0.1 K/uL (ref 0.0–0.5)
Eosinophils Relative: 1 %
HCT: 46.4 % (ref 39.0–52.0)
Hemoglobin: 15.2 g/dL (ref 13.0–17.0)
Immature Granulocytes: 0 %
Lymphocytes Relative: 19 %
Lymphs Abs: 1.4 K/uL (ref 0.7–4.0)
MCH: 29.7 pg (ref 26.0–34.0)
MCHC: 32.8 g/dL (ref 30.0–36.0)
MCV: 90.8 fL (ref 80.0–100.0)
Monocytes Absolute: 0.9 K/uL (ref 0.1–1.0)
Monocytes Relative: 13 %
Neutro Abs: 4.9 K/uL (ref 1.7–7.7)
Neutrophils Relative %: 66 %
Platelets: 217 K/uL (ref 150–400)
RBC: 5.11 MIL/uL (ref 4.22–5.81)
RDW: 12.3 % (ref 11.5–15.5)
WBC: 7.4 K/uL (ref 4.0–10.5)
nRBC: 0 % (ref 0.0–0.2)

## 2024-01-04 LAB — COMPREHENSIVE METABOLIC PANEL WITH GFR
ALT: 26 U/L (ref 0–44)
AST: 31 U/L (ref 15–41)
Albumin: 4.7 g/dL (ref 3.5–5.0)
Alkaline Phosphatase: 81 U/L (ref 38–126)
Anion gap: 11 (ref 5–15)
BUN: 23 mg/dL (ref 8–23)
CO2: 25 mmol/L (ref 22–32)
Calcium: 9.5 mg/dL (ref 8.9–10.3)
Chloride: 101 mmol/L (ref 98–111)
Creatinine, Ser: 0.99 mg/dL (ref 0.61–1.24)
GFR, Estimated: >60 mL/min (ref >60)
Glucose, Bld: 126 mg/dL — ABNORMAL HIGH (ref 70–99)
Potassium: 3.6 mmol/L (ref 3.5–5.1)
Sodium: 137 mmol/L (ref 135–145)
Total Bilirubin: 1 mg/dL (ref 0.0–1.2)
Total Protein: 7.5 g/dL (ref 6.5–8.1)

## 2024-01-04 LAB — URINALYSIS, ROUTINE W REFLEX MICROSCOPIC
Bacteria, UA: NONE SEEN
Bilirubin Urine: NEGATIVE
Glucose, UA: NEGATIVE mg/dL
Ketones, ur: NEGATIVE mg/dL
Nitrite: NEGATIVE
Protein, ur: NEGATIVE mg/dL
Specific Gravity, Urine: 1.017 (ref 1.005–1.030)
pH: 5 (ref 5.0–8.0)

## 2024-01-04 LAB — LIPASE, BLOOD: Lipase: 36 U/L (ref 11–51)

## 2024-01-04 MED ORDER — ONDANSETRON HCL 4 MG/2ML IJ SOLN
4.0000 mg | Freq: Once | INTRAMUSCULAR | Status: AC
  Administered 2024-01-04: 4 mg via INTRAVENOUS
  Filled 2024-01-04: qty 2

## 2024-01-04 MED ORDER — CEPHALEXIN 500 MG PO CAPS: 500.0000 mg | ORAL_CAPSULE | Freq: Two times a day (BID) | ORAL | 0 refills | Status: AC

## 2024-01-04 MED ORDER — IOHEXOL 300 MG/ML  SOLN
100.0000 mL | Freq: Once | INTRAMUSCULAR | Status: AC | PRN
  Administered 2024-01-04: 100 mL via INTRAVENOUS

## 2024-01-04 MED ORDER — SODIUM CHLORIDE 0.9 % IV SOLN
1.0000 g | Freq: Once | INTRAVENOUS | Status: AC
  Administered 2024-01-04: 1 g via INTRAVENOUS
  Filled 2024-01-04: qty 10

## 2024-01-04 MED ORDER — MORPHINE SULFATE (PF) 4 MG/ML IV SOLN
4.0000 mg | Freq: Once | INTRAVENOUS | Status: AC
  Administered 2024-01-04: 4 mg via INTRAVENOUS
  Filled 2024-01-04: qty 1

## 2024-01-04 NOTE — ED Provider Notes (Signed)
 [place] EMERGENCY DEPARTMENT AT [facility] Provider Note   CSN: [id number] Arrival date & time: 01/04/24  9381     Patient presents with: Flank Pain   EDIN [name] is a 73 y.o. male past medical history significant for kidney stone, diverticulitis, and aortic valve replacement presents today for left flank pain that began early this morning while he was sleeping.  Patient reports hematuria for 9 days and was seen by urology yesterday.  Patient reports that his hematuria has improved.  Patient reports feeling like he is unable to urinate.  Patient states his flank pain radiates around his abdomen and through to the tip of my [his] penis.  Patient denies fever, chills, nausea, vomiting, chest pain, or shortness of breath.    Flank Pain       Prior to Admission medications   Medication Sig Start Date End Date Taking? Authorizing Provider  cephALEXin  (KEFLEX ) 500 MG capsule Take 1 capsule (500 mg total) by mouth 2 (two) times daily. 01/04/24  Yes Danasia Baker N, PA-C  allopurinol  (ZYLOPRIM ) 300 MG tablet TAKE 1 TABLET BY MOUTH EVERY DAY 09/05/23   Burchette, Wolm ORN, MD  amLODipine  (NORVASC ) 5 MG tablet Take 1 tablet (5 mg total) by mouth daily. 12/31/23   Burchette, Wolm ORN, MD  amphetamine -dextroamphetamine  (ADDERALL) 10 MG tablet Take 1 tablet (10 mg total) by mouth 2 (two) times daily. 12/31/23   Burchette, Wolm ORN, MD  amphetamine -dextroamphetamine  (ADDERALL) 10 MG tablet Take 1 tablet (10 mg total) by mouth 2 (two) times daily. 12/31/23   Burchette, Wolm ORN, MD  levothyroxine  (SYNTHROID ) 112 MCG tablet Take 1 tablet (112 mcg total) by mouth daily. 01/02/24   Burchette, Wolm ORN, MD  metoprolol  succinate (TOPROL -XL) 25 MG 24 hr tablet TAKE 1 TABLET (25 MG TOTAL) BY MOUTH DAILY. 09/06/23   Okey Vina GAILS, MD  oxyCODONE (OXY IR/ROXICODONE) 5 MG immediate release tablet Take 5 mg by mouth 2 (two) times daily as needed for severe pain or moderate pain.    [provider]   sildenafil  (VIAGRA ) 100 MG tablet Take 0.5-1 tablets (50-100 mg total) by mouth daily as needed for erectile dysfunction. 06/25/23   Burchette, Wolm ORN, MD  warfarin (COUMADIN ) 5 MG tablet TAKE 2 TABLETS BY MOUTH DAILY EXCEPT TAKE 1 TABLET ON TUESDAYS AND FRIDAYS OR AS DIRECTED BY ANTICOAGULATION CLINIC 11/19/23   Burchette, Wolm ORN, MD    Allergies: Shellfish allergy and Penicillins    Review of Systems  Genitourinary:  Positive for difficulty urinating, flank pain and urgency.    Updated Vital Signs BP (!) 153/102 (BP Location: Left Arm)   Pulse 69   Temp (!) 97.4 F (36.3 C) (Oral)   Resp 18   Ht 6' 1 (1.854 m)   Wt 99.8 kg   SpO2 99%   BMI 29.03 kg/m   Physical Exam Vitals and nursing note reviewed.  Constitutional:      General: He is not in acute distress.    Appearance: Normal appearance. He is well-developed. He is not ill-appearing or toxic-appearing.  HENT:     Head: Normocephalic and atraumatic.     Right Ear: External ear normal.     Left Ear: External ear normal.  Eyes:     Extraocular Movements: Extraocular movements intact.     Conjunctiva/sclera: Conjunctivae normal.     Pupils: Pupils are equal, round, and reactive to light.  Cardiovascular:     Rate and Rhythm: Normal rate and regular rhythm.  Pulses: Normal pulses.     Heart sounds: No murmur heard.    Comments: Clicking of mechanical aortic valve heard on auscultation Pulmonary:     Effort: Pulmonary effort is normal. No respiratory distress.     Breath sounds: Normal breath sounds.  Abdominal:     General: There is no distension.     Palpations: Abdomen is soft.     Tenderness: There is no abdominal tenderness. There is left CVA tenderness.  Musculoskeletal:        General: No swelling.     Cervical back: Neck supple.  Skin:    General: Skin is warm and dry.     Capillary Refill: Capillary refill takes less than 2 seconds.  Neurological:     General: No focal deficit present.     Mental  Status: He is alert and oriented to person, place, and time.     Cranial Nerves: No cranial nerve deficit.     Sensory: No sensory deficit.  Psychiatric:        Mood and Affect: Mood normal.     (all labs ordered are listed, but only abnormal results are displayed) Labs Reviewed  COMPREHENSIVE METABOLIC PANEL WITH GFR - Abnormal; Notable for the following components:      Result Value   Glucose, Bld 126 (*)    All other components within normal limits  URINALYSIS, ROUTINE W REFLEX MICROSCOPIC - Abnormal; Notable for the following components:   Hgb urine dipstick LARGE (*)    Leukocytes,Ua TRACE (*)    All other components within normal limits  URINE CULTURE  LIPASE, BLOOD  CBC WITH DIFFERENTIAL/PLATELET    EKG: None  Radiology: CT ABDOMEN PELVIS W CONTRAST Result Date: 01/04/2024 CLINICAL DATA:  Abdominal and flank pain.  Stone suspected EXAM: CT ABDOMEN AND PELVIS WITH CONTRAST TECHNIQUE: Multidetector CT imaging of the abdomen and pelvis was performed using the standard protocol following bolus administration of intravenous contrast. RADIATION DOSE REDUCTION: This exam was performed according to the departmental dose-optimization program which includes automated exposure control, adjustment of the mA and/or kV according to patient size and/or use of iterative reconstruction technique. CONTRAST:  OMNIPAQUE  IOHEXOL  300 MG/ML  SOLN COMPARISON:  None Available. FINDINGS: Lower chest: Lung bases are clear. Hepatobiliary: No focal hepatic lesion. Normal gallbladder. No biliary duct dilatation. Common bile duct is normal. Pancreas: Pancreas is normal. No ductal dilatation. No pancreatic inflammation. Spleen: Normal spleen Adrenals/urinary tract: Adrenal glands normal. Simple fluid attenuation cyst in the cortex of the LEFT kidney measures 17 mm. There is mild asymmetric perinephric stranding on the LEFT compared to the RIGHT. There is mild enhancement of the urothelium of the LEFT  ureter. There is mild dilatation of the LEFT ureter. There is no obstructing calculus identified in the LEFT ureter. Single tiny 1 mm calculus in the LEFT kidney on image 71/8) no hydronephrosis. No pyelonephritis identified within the LEFT kidney. RIGHT kidney is normal. Delayed imaging demonstrates no renal obstruction. Stomach/Bowel: Stomach, small bowel, appendix, and cecum are normal. The colon and rectosigmoid colon are normal. Vascular/Lymphatic: Abdominal aorta is normal caliber. No periportal or retroperitoneal adenopathy. No pelvic adenopathy. Reproductive: Prostate unremarkable Other: No free fluid. Musculoskeletal: No aggressive osseous lesion. IMPRESSION: 1. Mild asymmetric perinephric stranding on the LEFT compared to the RIGHT. Mild enhancement of the urothelium of the LEFT ureter. No obstructing calculus identified. Findings suggestive of ascending urinary tract infection versus passed ureteral stone. Single tiny nonobstructing LEFT renal calculi. 2. No evidence of pyelonephritis.  3. Normal RIGHT kidney. 4. Normal appendix. Electronically Signed   By: Jackquline Boxer M.D.   On: 01/04/2024 09:22     Procedures   Medications Ordered in the ED  cefTRIAXone  (ROCEPHIN ) 1 g in sodium chloride  0.9 % 100 mL IVPB (has no administration in time range)  morphine  (PF) 4 MG/ML injection 4 mg (4 mg Intravenous Given 01/04/24 9287)  ondansetron  (ZOFRAN ) injection 4 mg (4 mg Intravenous Given 01/04/24 0717)  iohexol  (OMNIPAQUE ) 300 MG/ML solution 100 mL (100 mLs Intravenous Contrast Given 01/04/24 0836)                                    Medical Decision Making Amount and/or Complexity of Data Reviewed Labs: ordered. Radiology: ordered.  Risk Prescription drug management.   This patient presents to the ED for concern of left flank pain differential diagnosis includes diverticulitis, kidney stone, pyelonephritis, urinary retention    Additional history obtained   Additional history  obtained from Electronic Medical Record External records from outside source obtained and reviewed including family medicine notes   Lab Tests:  I Ordered, and personally interpreted labs.  The pertinent results include: CBC WNL, mildly elevated glucose at 126, lipase 36, urine with large hemoglobin, trace leukocytes, 21-50 RBCs, 6-10 WBCs   Imaging Studies ordered:  I ordered imaging studies including CT abdomen pelvis with contrast I independently visualized and interpreted imaging which showed mild asymmetric perinephric stranding on the left kidney.  Mild enhancement of the urothelium of the left ureter.  Findings suggestive of ascending UTI versus passed ureteral stone.  Single tiny nonobstructing left renal calculi.  No evidence of pyelonephritis. I agree with the radiologist interpretation   Medicines ordered and prescription drug management:  I ordered medication including morphine  and Zofran     I have reviewed the patients home medicines and have made adjustments as needed   Problem List / ED Course:  Bladder scan showed approximately 3 mL in the bladder. Patient given 1 gram of Rocephin  while in the ED Considered for admission or further workup however patient's vital signs, physical exam, labs, imaging are reassuring.  Patient has no signs or symptoms concerning for septic stone, sepsis, or urinary retention.  Patient also being treated for possible UTI given UA and CT.  Patient to follow-up with urology for further evaluation and workup.  Patient given return precautions.  I feel patient safe for discharge at this time.        Final diagnoses:  Flank pain  Lower urinary tract infectious disease    ED Discharge Orders          Ordered    cephALEXin  (KEFLEX ) 500 MG capsule  2 times daily        01/04/24 1007               Marjani Kobel N, PA-C 01/04/24 1008    Suzette Pac, MD 01/06/24 1032

## 2024-01-04 NOTE — ED Triage Notes (Signed)
 Patient c/o left flank pain started this morning. Patient report hematuria for 9 days and was seen by urology yesterday. Patient report taking PRN oxycodone 5 mg without relief. Patient denies Nausea and vomiting. Patient denies fever.

## 2024-01-04 NOTE — Discharge Instructions (Signed)
 Today you were seen for left flank pain.  Your urine was suspicious for an infection.  Please pick up your antibiotic and take as prescribed.  Please follow-up with urology for further evaluation workup.  Please return to the ED if you have uncontrollable vomiting, fever that does not go down with Tylenol , or worsening symptoms.  Thank you for letting us  treat you today. After reviewing your labs and imaging, I feel you are safe to go home. Please follow up with your PCP in the next several days and provide them with your records from this visit. Return to the Emergency Room if pain becomes severe or symptoms worsen.

## 2024-01-05 LAB — URINE CULTURE: Culture: NO GROWTH

## 2024-01-15 DIAGNOSIS — R310 Gross hematuria: Secondary | ICD-10-CM | POA: Diagnosis not present

## 2024-01-16 ENCOUNTER — Ambulatory Visit

## 2024-01-16 DIAGNOSIS — Z7901 Long term (current) use of anticoagulants: Secondary | ICD-10-CM | POA: Diagnosis not present

## 2024-01-16 LAB — POCT INR: INR: 2.8 (ref 2.0–3.0)

## 2024-01-16 NOTE — Patient Instructions (Signed)
Pre visit review using our clinic review tool, if applicable. No additional management support is needed unless otherwise documented below in the visit note. 

## 2024-01-16 NOTE — Progress Notes (Signed)
 Pt in the ER on 7/25 for gross hematuria. He was seen in the office by PCP on 7/23 which an urgent urology referral was placed. Pt went to the ER on 7/25 due to severe flank pain and was diagnosed with a kidney stone. Pt had f/u with urology yesterday with a cystoscopy. All was normal and only a small kidney stone was found. Urology reported no concern for the remaining stone. Pt denies any further hematuria.  Continue  2 tablets daily except take 1 tablet on Tuesday. Recheck in 6 weeks. Pt refuses printed AVS and uses mychart for instructions.

## 2024-01-19 ENCOUNTER — Other Ambulatory Visit: Payer: Self-pay | Admitting: Family Medicine

## 2024-01-19 DIAGNOSIS — M109 Gout, unspecified: Secondary | ICD-10-CM

## 2024-01-22 DIAGNOSIS — B353 Tinea pedis: Secondary | ICD-10-CM | POA: Diagnosis not present

## 2024-01-22 DIAGNOSIS — D2371 Other benign neoplasm of skin of right lower limb, including hip: Secondary | ICD-10-CM | POA: Diagnosis not present

## 2024-01-22 DIAGNOSIS — M79671 Pain in right foot: Secondary | ICD-10-CM | POA: Diagnosis not present

## 2024-02-11 ENCOUNTER — Other Ambulatory Visit: Payer: Self-pay | Admitting: Internal Medicine

## 2024-02-19 DIAGNOSIS — Z5181 Encounter for therapeutic drug level monitoring: Secondary | ICD-10-CM | POA: Diagnosis not present

## 2024-02-19 DIAGNOSIS — M5416 Radiculopathy, lumbar region: Secondary | ICD-10-CM | POA: Diagnosis not present

## 2024-02-19 DIAGNOSIS — Z79899 Other long term (current) drug therapy: Secondary | ICD-10-CM | POA: Diagnosis not present

## 2024-02-19 DIAGNOSIS — M5412 Radiculopathy, cervical region: Secondary | ICD-10-CM | POA: Diagnosis not present

## 2024-02-27 ENCOUNTER — Ambulatory Visit

## 2024-02-27 DIAGNOSIS — Z7901 Long term (current) use of anticoagulants: Secondary | ICD-10-CM | POA: Diagnosis not present

## 2024-02-27 LAB — POCT INR: INR: 2.8 (ref 2.0–3.0)

## 2024-02-27 NOTE — Patient Instructions (Addendum)
 Pre visit review using our clinic review tool, if applicable. No additional management support is needed unless otherwise documented below in the visit note.  Continue  2 tablets daily except take 1 tablet on Tuesday. Recheck in 6 weeks.

## 2024-02-27 NOTE — Progress Notes (Signed)
 Continue  2 tablets daily except take 1 tablet on Tuesday. Recheck in 6 weeks. Pt refuses printed AVS and uses mychart for instructions.

## 2024-02-29 ENCOUNTER — Encounter: Payer: Self-pay | Admitting: Family Medicine

## 2024-03-03 MED ORDER — AMPHETAMINE-DEXTROAMPHETAMINE 10 MG PO TABS: 10.0000 mg | ORAL_TABLET | Freq: Two times a day (BID) | ORAL | 0 refills | Status: DC

## 2024-03-11 DIAGNOSIS — M79674 Pain in right toe(s): Secondary | ICD-10-CM | POA: Diagnosis not present

## 2024-03-11 DIAGNOSIS — B351 Tinea unguium: Secondary | ICD-10-CM | POA: Diagnosis not present

## 2024-03-11 DIAGNOSIS — M79675 Pain in left toe(s): Secondary | ICD-10-CM | POA: Diagnosis not present

## 2024-03-28 ENCOUNTER — Ambulatory Visit: Admitting: Family Medicine

## 2024-03-28 ENCOUNTER — Other Ambulatory Visit: Payer: Self-pay | Admitting: Family Medicine

## 2024-03-28 ENCOUNTER — Encounter: Payer: Self-pay | Admitting: Family Medicine

## 2024-03-28 VITALS — BP 130/80 | HR 74 | Temp 98.3°F | Wt 223.5 lb

## 2024-03-28 DIAGNOSIS — S300XXA Contusion of lower back and pelvis, initial encounter: Secondary | ICD-10-CM | POA: Diagnosis not present

## 2024-03-28 DIAGNOSIS — R6889 Other general symptoms and signs: Secondary | ICD-10-CM

## 2024-03-28 DIAGNOSIS — E039 Hypothyroidism, unspecified: Secondary | ICD-10-CM | POA: Diagnosis not present

## 2024-03-28 LAB — TSH: TSH: 0.82 u[IU]/mL (ref 0.35–5.50)

## 2024-03-28 LAB — T4, FREE: Free T4: 0.98 ng/dL (ref 0.60–1.60)

## 2024-03-28 NOTE — Progress Notes (Unsigned)
 Established Patient Office Visit  Subjective   Patient ID: Samuel [name], male    DOB: [birth year]  Age: 73 y.o. MRN: [id number]  Chief Complaint  Patient presents with   Oral Pain    HPI  {History (Optional):23778} [name] is here today for the following issues  Santina to his dentist this past Wednesday.  They noticed erythematous area left soft palate and recommend he get this checked out.  He has not noted any pain.  No lymphadenopathy.  No recent appetite or weight changes.  No sore throat.  He has not noted any ulceration.  History of hypothyroidism.  He had labs done in July with low TSH.  We reduced dosage of his levothyroxine .  Needs follow-up labs today.  He was recently out in [place], [place] .  He had a fall when he was out there and has extensive bruising on his buttock.  He is on Coumadin  with most recent INR 2.8.  No other bleeding complications.  No other injuries reported.  No difficulty with ambulation.  No hip pain.  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   Dyslipidemia    Gout    Hemorrhoids    HEMORRHOIDS-INTERNAL 12/20/2009   History of diverticulitis 03/2018   HYPERLIPIDEMIA-MIXED 04/01/2009   HYPOTHYROIDISM 08/26/2008   INSOMNIA, TRANSIENT 04/27/2009   Kidney stones    KNEE PAIN 11/02/2008   LATERAL EPICONDYLITIS 06/02/2009   Migraines    ONYCHOMYCOSIS 11/02/2008   PERSONAL HX COLONIC POLYPS 12/20/2009   Sudden visual loss 12/25/2008   TRANSIENT ISCHEMIC ATTACK 01/25/2009   Past Surgical History:  Procedure Laterality Date   CARDIAC VALVE REPLACEMENT     CARDIOVERSION N/A 10/05/2022   Procedure: CARDIOVERSION;  Surgeon: Alvan Ronal BRAVO, MD;  Location: [facility] INVASIVE CV LAB;  Service: Cardiovascular;  Laterality: N/A;   CYSTECTOMY     from abdominal wall   MYRINGOTOMY WITH TUBE PLACEMENT Left    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 He has  never used smokeless tobacco. He reports that he does not currently use alcohol. He reports that he does not use drugs. family history includes Diabetes in his mother; Heart disease in his father. Allergies  Allergen Reactions   Shellfish Allergy Anaphylaxis   Penicillins     REACTION: childhood, reaction unknown    Review of Systems  Constitutional:  Negative for chills, fever and weight loss.  Cardiovascular:  Negative for chest pain.      Objective:     BP 130/80   Pulse 74   Temp 98.3 F (36.8 C) (Oral)   Wt 223 lb 8 oz (101.4 kg)   SpO2 95%   BMI 29.49 kg/m  {Vitals History (Optional):23777}  Physical Exam Vitals reviewed.  Constitutional:      General: He is not in acute distress.    Appearance: He is not ill-appearing.  HENT:     Mouth/Throat:     Comments: Oropharynx reveals left soft palate approximately 2 to 3 mm area of mild erythema.  No evidence for any ulceration.  No nodular regrowth.  No necrosis. Cardiovascular:     Rate and Rhythm: Normal rate.  Pulmonary:     Effort: Pulmonary effort is normal.     Breath sounds: Normal breath sounds.  Musculoskeletal:     Cervical back: Neck supple.  Lymphadenopathy:  Cervical: No cervical adenopathy.  Skin:    Comments: Extensive bruising right buttock.  He has approximately 6 x 6 cm area of hematoma right upper medial buttock.  Nontender to palpation  Neurological:     Mental Status: He is alert.      No results found for any visits on 03/28/24.  {Labs (Optional):23779}  The ASCVD Risk score (Arnett DK, et al., 2019) failed to calculate for the following reasons:   Cannot find a previous HDL lab   Cannot find a previous total cholesterol lab    Assessment & Plan:   #1 abnormal finding on exam oropharynx.  He has small area of mild erythema noted by dentist couple days ago.  Does not recall any trauma.  No associated pain.  Does not any red flag such as appetite change, weight loss, adenopathy.   No necrosis or ulceration.  Recommend reexamine in a couple weeks.  If persisting set up referral to oral surgeon  #2 hypothyroidism.  Slightly over replaced by labs July.  We adjusted his medication.  Recheck TSH and free T4  #3 hematoma right buttock from recent fall.  No difficulties with ambulation.  No clinical indication for x-rays.  He is aware hematoma will take months to fully resolve.  Can apply topical heat   No follow-ups on file.    Wolm Scarlet, MD

## 2024-03-28 NOTE — Patient Instructions (Signed)
 Watch area of redness left soft palate region and follow up if not clearing in 2-3 weeks.

## 2024-03-30 ENCOUNTER — Ambulatory Visit: Payer: Self-pay | Admitting: Family Medicine

## 2024-03-31 ENCOUNTER — Encounter: Payer: Self-pay | Admitting: Family Medicine

## 2024-04-01 MED ORDER — AMPHETAMINE-DEXTROAMPHETAMINE 10 MG PO TABS: 10.0000 mg | ORAL_TABLET | Freq: Two times a day (BID) | ORAL | 0 refills | Status: DC

## 2024-04-01 NOTE — Telephone Encounter (Signed)
 Refilled for 3 months  Wolm LELON Scarlet MD [facility] at [facility]

## 2024-04-09 ENCOUNTER — Ambulatory Visit

## 2024-04-09 DIAGNOSIS — Z7901 Long term (current) use of anticoagulants: Secondary | ICD-10-CM

## 2024-04-09 LAB — POCT INR: INR: 2.2 (ref 2.0–3.0)

## 2024-04-09 NOTE — Patient Instructions (Addendum)
 Pre visit review using our clinic review tool, if applicable. No additional management support is needed unless otherwise documented below in the visit note.  Continue  2 tablets daily except take 1 tablet on Tuesday. Recheck in 6 weeks.

## 2024-04-09 NOTE — Progress Notes (Signed)
 Continue  2 tablets daily except take 1 tablet on Tuesday. Recheck in 6 weeks. Pt refuses printed AVS and uses mychart for instructions.

## 2024-04-16 ENCOUNTER — Encounter: Payer: Self-pay | Admitting: Family Medicine

## 2024-05-20 DIAGNOSIS — B351 Tinea unguium: Secondary | ICD-10-CM | POA: Diagnosis not present

## 2024-05-20 DIAGNOSIS — M79674 Pain in right toe(s): Secondary | ICD-10-CM | POA: Diagnosis not present

## 2024-05-20 DIAGNOSIS — M79675 Pain in left toe(s): Secondary | ICD-10-CM | POA: Diagnosis not present

## 2024-05-21 ENCOUNTER — Ambulatory Visit

## 2024-05-22 ENCOUNTER — Other Ambulatory Visit: Payer: Self-pay | Admitting: Family Medicine

## 2024-05-22 DIAGNOSIS — Z7901 Long term (current) use of anticoagulants: Secondary | ICD-10-CM

## 2024-05-22 NOTE — Telephone Encounter (Signed)
 Pt is compliant with warfarin management and PCP apts.  Sent in refill of warfarin to requested pharmacy.

## 2024-05-26 ENCOUNTER — Ambulatory Visit

## 2024-05-28 ENCOUNTER — Ambulatory Visit

## 2024-05-28 DIAGNOSIS — Z7901 Long term (current) use of anticoagulants: Secondary | ICD-10-CM

## 2024-05-28 LAB — POCT INR: INR: 2.9 (ref 2.0–3.0)

## 2024-05-28 NOTE — Patient Instructions (Addendum)
 Pre visit review using our clinic review tool, if applicable. No additional management support is needed unless otherwise documented below in the visit note.  Continue  2 tablets daily except take 1 tablet on Tuesday. Recheck in 6 weeks.

## 2024-05-28 NOTE — Progress Notes (Signed)
 Continue  2 tablets daily except take 1 tablet on Tuesday. Recheck in 6 weeks. Pt refuses printed AVS and uses mychart for instructions.

## 2024-06-01 ENCOUNTER — Encounter: Payer: Self-pay | Admitting: Family Medicine

## 2024-06-02 MED ORDER — DEXTROAMPHETAMINE SULFATE 10 MG PO TABS: 10.0000 mg | ORAL_TABLET | Freq: Two times a day (BID) | ORAL | 0 refills | Status: DC

## 2024-06-03 ENCOUNTER — Telehealth: Payer: Self-pay | Admitting: *Deleted

## 2024-06-03 NOTE — Telephone Encounter (Signed)
 Savannah given clarification on RX

## 2024-06-03 NOTE — Telephone Encounter (Signed)
 Copied from CRM #[id number]. Topic: Clinical - Prescription Issue >> Jun 03, 2024  8:48 AM Vena HERO wrote: Reason for CRM: [facility] called from [facility] to get clarification on medication change that was sent in yesterday for Dextrostat . Please call back at [phone]

## 2024-06-10 ENCOUNTER — Encounter: Payer: Self-pay | Admitting: Family Medicine

## 2024-06-11 MED ORDER — AMPHETAMINE-DEXTROAMPHETAMINE 10 MG PO TABS: 10.0000 mg | ORAL_TABLET | Freq: Two times a day (BID) | ORAL | 0 refills | Status: DC

## 2024-06-11 NOTE — Telephone Encounter (Signed)
 I spoke with Luke at the patient's pharmacy and she reported the most recent prescription sent for Dextrostat  is a different prescription from the patient's previous Adderall rx. Luke stated that PCP can order generic Adderall and they can order correct manufacturer based on patient preference

## 2024-06-11 NOTE — Telephone Encounter (Signed)
 I have sent this in and pharmacist will have to work with him regarding specific manufacturer.  Wolm LELON Scarlet MD [facility] at [facility]

## 2024-06-25 ENCOUNTER — Other Ambulatory Visit: Payer: Self-pay | Admitting: Family Medicine

## 2024-07-09 ENCOUNTER — Ambulatory Visit

## 2024-07-09 DIAGNOSIS — Z7901 Long term (current) use of anticoagulants: Secondary | ICD-10-CM | POA: Diagnosis not present

## 2024-07-09 LAB — POCT INR: INR: 2.8 (ref 2.0–3.0)

## 2024-07-09 NOTE — Patient Instructions (Addendum)
 Pre visit review using our clinic review tool, if applicable. No additional management support is needed unless otherwise documented below in the visit note.  Continue  2 tablets daily except take 1 tablet on Tuesday. Recheck in 6 weeks.

## 2024-07-09 NOTE — Progress Notes (Signed)
 Continue  2 tablets daily except take 1 tablet on Tuesday. Recheck in 6 weeks. Pt refuses printed AVS and uses mychart for instructions.

## 2024-07-15 ENCOUNTER — Encounter: Payer: Self-pay | Admitting: Family Medicine

## 2024-07-15 MED ORDER — AMPHETAMINE-DEXTROAMPHETAMINE 10 MG PO TABS: 10.0000 mg | ORAL_TABLET | Freq: Two times a day (BID) | ORAL | 0 refills | Status: AC

## 2024-07-17 ENCOUNTER — Other Ambulatory Visit: Payer: Self-pay | Admitting: Family Medicine

## 2024-07-17 DIAGNOSIS — M109 Gout, unspecified: Secondary | ICD-10-CM

## 2024-08-20 ENCOUNTER — Ambulatory Visit
